# Patient Record
Sex: Female | Born: 1985 | Race: White | Hispanic: No | Marital: Single | Smoking: Current some day smoker
Health system: Southern US, Community
[De-identification: ages and names within clinical notes are randomized; demographics above are authoritative.]

## PROBLEM LIST (undated history)

## (undated) DIAGNOSIS — M549 Dorsalgia, unspecified: Secondary | ICD-10-CM

## (undated) DIAGNOSIS — F329 Major depressive disorder, single episode, unspecified: Secondary | ICD-10-CM

## (undated) DIAGNOSIS — D649 Anemia, unspecified: Secondary | ICD-10-CM

## (undated) DIAGNOSIS — F1010 Alcohol abuse, uncomplicated: Secondary | ICD-10-CM

## (undated) DIAGNOSIS — F419 Anxiety disorder, unspecified: Secondary | ICD-10-CM

## (undated) DIAGNOSIS — F3130 Bipolar disorder, current episode depressed, mild or moderate severity, unspecified: Secondary | ICD-10-CM

## (undated) DIAGNOSIS — E538 Deficiency of other specified B group vitamins: Secondary | ICD-10-CM

## (undated) DIAGNOSIS — J45909 Unspecified asthma, uncomplicated: Secondary | ICD-10-CM

## (undated) DIAGNOSIS — F1990 Other psychoactive substance use, unspecified, uncomplicated: Secondary | ICD-10-CM

## (undated) DIAGNOSIS — K5900 Constipation, unspecified: Secondary | ICD-10-CM

## (undated) DIAGNOSIS — F32A Depression, unspecified: Secondary | ICD-10-CM

## (undated) HISTORY — DX: Constipation, unspecified: K59.00

## (undated) HISTORY — DX: Anemia, unspecified: D64.9

## (undated) HISTORY — DX: Deficiency of other specified B group vitamins: E53.8

## (undated) HISTORY — DX: Alcohol abuse, uncomplicated: F10.10

## (undated) HISTORY — PX: FRACTURE SURGERY: SHX138

## (undated) HISTORY — DX: Unspecified asthma, uncomplicated: J45.909

## (undated) HISTORY — DX: Dorsalgia, unspecified: M54.9

## (undated) HISTORY — DX: Bipolar disorder, current episode depressed, mild or moderate severity, unspecified: F31.30

## (undated) HISTORY — PX: OTHER SURGICAL HISTORY: SHX169

## (undated) HISTORY — PX: BREAST SURGERY: SHX581

## (undated) HISTORY — DX: Other psychoactive substance use, unspecified, uncomplicated: F19.90

---
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
## 2006-04-17 ENCOUNTER — Emergency Department (HOSPITAL_COMMUNITY): Admission: EM | Admit: 2006-04-17 | Discharge: 2006-04-17 | Payer: Self-pay | Admitting: Emergency Medicine

## 2007-02-24 ENCOUNTER — Other Ambulatory Visit: Admission: RE | Admit: 2007-02-24 | Discharge: 2007-02-24 | Payer: Self-pay | Admitting: Gynecology

## 2007-05-21 ENCOUNTER — Other Ambulatory Visit (HOSPITAL_COMMUNITY): Admission: RE | Admit: 2007-05-21 | Discharge: 2007-08-19 | Payer: Self-pay | Admitting: Psychiatry

## 2007-05-21 ENCOUNTER — Ambulatory Visit: Payer: Self-pay | Admitting: Psychiatry

## 2008-01-06 ENCOUNTER — Encounter: Admission: RE | Admit: 2008-01-06 | Discharge: 2008-01-06 | Payer: Self-pay | Admitting: Gynecology

## 2008-07-04 ENCOUNTER — Encounter: Admission: RE | Admit: 2008-07-04 | Discharge: 2008-07-04 | Payer: Self-pay | Admitting: Gynecology

## 2009-01-30 ENCOUNTER — Encounter: Admission: RE | Admit: 2009-01-30 | Discharge: 2009-01-30 | Payer: Self-pay | Admitting: Gynecology

## 2009-02-27 ENCOUNTER — Encounter (INDEPENDENT_AMBULATORY_CARE_PROVIDER_SITE_OTHER): Payer: Self-pay | Admitting: Surgery

## 2009-02-27 ENCOUNTER — Ambulatory Visit (HOSPITAL_BASED_OUTPATIENT_CLINIC_OR_DEPARTMENT_OTHER): Admission: RE | Admit: 2009-02-27 | Discharge: 2009-02-27 | Payer: Self-pay | Admitting: Surgery

## 2009-10-21 HISTORY — PX: BREAST SURGERY: SHX581

## 2010-11-12 ENCOUNTER — Other Ambulatory Visit: Payer: Self-pay | Admitting: Gynecology

## 2010-12-03 ENCOUNTER — Other Ambulatory Visit: Payer: Self-pay | Admitting: Gynecology

## 2011-01-29 LAB — POCT HEMOGLOBIN-HEMACUE: Hemoglobin: 13 g/dL (ref 12.0–15.0)

## 2011-02-07 ENCOUNTER — Other Ambulatory Visit: Payer: Self-pay | Admitting: Dermatology

## 2011-03-05 NOTE — Op Note (Signed)
Sarah Cardenas, Sarah Cardenas                  ACCOUNT NO.:  [id number]   MEDICAL RECORD NO.:  [id number]          PATIENT TYPE:  AMB   LOCATION:  DSC                          FACILITY:  [facility]   PHYSICIAN:  Currie Paris, M.D.DATE OF BIRTH:  July 28, 1986   DATE OF PROCEDURE:  DATE OF DISCHARGE:  02/27/2009                               OPERATIVE REPORT   PREOPERATIVE DIAGNOSIS:  Left breast mass, probable fibroadenoma.   POSTOPERATIVE DIAGNOSIS:  Left breast mass, probable fibroadenoma.   OPERATION:  Excisional biopsy, left breast mass.   SURGEON:  Currie Paris, MD   ANESTHESIA:  General.   CLINICAL HISTORY:  This is a 25 year old young lady with a palpable  somewhat enlarging left breast mass that she wished to have removed.   DESCRIPTION OF PROCEDURE:  I saw the patient in the holding area and we  confirmed left breast mass excision as the planned procedure.  The left  breast was initialed to confirm the operative side.   The patient was taken to the operating room, and after satisfactory  general anesthesia had been obtained, the left breast was prepped and  draped and the time-out done.   I made a short curvilinear incision just inferior to the mass, staying a  little closer to the nipple.  I divided some subcutaneous tissue and by  palpation was then able to come around the mass using cautery and  excised it in toto.   Once it was out, I made sure everything was dry throughout.  I used  0.25% plain Marcaine to help with postop analgesia.   The incision was closed in layers with 3-0 Vicryl and 4-0 Monocryl  subcuticular plus Dermabond.   The patient tolerated the procedure well, and there were no operative  complications.  All counts were correct.      Currie Paris, M.D.  Electronically Signed     CJS/MEDQ  D:  02/27/2009  T:  02/28/2009  Job:  [id number]   cc:   Gretta Cool, M.D.  Clovis Riley, FNP  [facility].

## 2011-06-10 ENCOUNTER — Other Ambulatory Visit: Payer: Self-pay | Admitting: Gynecology

## 2011-07-12 ENCOUNTER — Other Ambulatory Visit (INDEPENDENT_AMBULATORY_CARE_PROVIDER_SITE_OTHER): Payer: Self-pay | Admitting: Otolaryngology

## 2011-07-12 DIAGNOSIS — H7010 Chronic mastoiditis, unspecified ear: Secondary | ICD-10-CM

## 2011-07-15 ENCOUNTER — Ambulatory Visit
Admission: RE | Admit: 2011-07-15 | Discharge: 2011-07-15 | Disposition: A | Discharge status: Home / Self Care | Payer: 59 | Source: Ambulatory Visit | Attending: Otolaryngology | Admitting: Otolaryngology

## 2011-07-15 DIAGNOSIS — H7010 Chronic mastoiditis, unspecified ear: Secondary | ICD-10-CM

## 2011-08-02 LAB — URINE DRUGS OF ABUSE SCREEN W ALC, ROUTINE (REF LAB)
Amphetamine Screen, Ur: NEGATIVE
Benzodiazepines.: NEGATIVE
Benzodiazepines.: NEGATIVE
Cocaine Metabolites: NEGATIVE
Creatinine,U: 242.8
Ethyl Alcohol: <5
Ethyl Alcohol: <5
Marijuana Metabolite: NEGATIVE
Opiate Screen, Urine: NEGATIVE
Opiate Screen, Urine: NEGATIVE
Phencyclidine (PCP): NEGATIVE
Propoxyphene: NEGATIVE

## 2011-08-05 LAB — URINE DRUGS OF ABUSE SCREEN W ALC, ROUTINE (REF LAB)
Amphetamine Screen, Ur: NEGATIVE
Barbiturate Quant, Ur: NEGATIVE
Benzodiazepines.: NEGATIVE
Cocaine Metabolites: NEGATIVE
Creatinine,U: 102.3
Ethyl Alcohol: <5
Marijuana Metabolite: NEGATIVE
Methadone: NEGATIVE
Opiate Screen, Urine: NEGATIVE
Phencyclidine (PCP): NEGATIVE
Propoxyphene: NEGATIVE

## 2011-12-23 ENCOUNTER — Other Ambulatory Visit: Payer: Self-pay | Admitting: Gynecology

## 2012-03-13 ENCOUNTER — Emergency Department (HOSPITAL_COMMUNITY)
Admission: EM | Admit: 2012-03-13 | Discharge: 2012-03-13 | Disposition: A | Discharge status: Home / Self Care | Payer: BC Managed Care – PPO | Attending: Emergency Medicine | Admitting: Emergency Medicine

## 2012-03-13 ENCOUNTER — Encounter (HOSPITAL_COMMUNITY): Payer: Self-pay | Admitting: *Deleted

## 2012-03-13 DIAGNOSIS — F17200 Nicotine dependence, unspecified, uncomplicated: Secondary | ICD-10-CM | POA: Insufficient documentation

## 2012-03-13 DIAGNOSIS — N390 Urinary tract infection, site not specified: Secondary | ICD-10-CM

## 2012-03-13 DIAGNOSIS — F411 Generalized anxiety disorder: Secondary | ICD-10-CM | POA: Insufficient documentation

## 2012-03-13 DIAGNOSIS — I10 Essential (primary) hypertension: Secondary | ICD-10-CM | POA: Insufficient documentation

## 2012-03-13 DIAGNOSIS — F3289 Other specified depressive episodes: Secondary | ICD-10-CM | POA: Insufficient documentation

## 2012-03-13 DIAGNOSIS — T7840XA Allergy, unspecified, initial encounter: Secondary | ICD-10-CM | POA: Insufficient documentation

## 2012-03-13 DIAGNOSIS — F329 Major depressive disorder, single episode, unspecified: Secondary | ICD-10-CM | POA: Insufficient documentation

## 2012-03-13 HISTORY — DX: Anxiety disorder, unspecified: F41.9

## 2012-03-13 HISTORY — DX: Depression, unspecified: F32.A

## 2012-03-13 HISTORY — DX: Major depressive disorder, single episode, unspecified: F32.9

## 2012-03-13 LAB — URINALYSIS, ROUTINE W REFLEX MICROSCOPIC
Bilirubin Urine: NEGATIVE
Ketones, ur: NEGATIVE mg/dL
Nitrite: NEGATIVE
Specific Gravity, Urine: 1.016 (ref 1.005–1.030)
Urobilinogen, UA: 0.2 mg/dL (ref 0.0–1.0)

## 2012-03-13 LAB — URINE MICROSCOPIC-ADD ON

## 2012-03-13 MED ORDER — CEPHALEXIN 500 MG PO CAPS: 500.0000 mg | ORAL_CAPSULE | Freq: Four times a day (QID) | ORAL | Status: AC

## 2012-03-13 MED ORDER — DIPHENHYDRAMINE HCL 25 MG PO CAPS
25.0000 mg | ORAL_CAPSULE | Freq: Once | ORAL | Status: AC
  Administered 2012-03-13: 25 mg via ORAL
  Filled 2012-03-13: qty 1

## 2012-03-13 MED ORDER — DIPHENHYDRAMINE HCL 25 MG PO TABS: 25.0000 mg | ORAL_TABLET | Freq: Four times a day (QID) | ORAL | Status: DC

## 2012-03-13 NOTE — Discharge Instructions (Signed)
Allergic Reaction, Mild to Moderate Allergies may happen from anything your body is sensitive to. This may be food, medications, pollens, chemicals, and nearly anything around you in everyday life that produces allergens. An allergen is anything that causes an allergy producing substance. Allergens cause your body to release allergic antibodies. Through a chain of events, they cause a release of histamine into the blood stream. Histamines are meant to protect you, but they also cause your discomfort. This is why antihistamines are often used for allergies. Heredity is often a factor in causing allergic reactions. This means you may have some of the same allergies as your parents. Allergies happen in all age groups. You may have some idea of what caused your reaction. There are many allergens around us. It may be difficult to know what caused your reaction. If this is a first time event, it may never happen again. Allergies cannot be cured but can be controlled with medications. SYMPTOMS  You may get some or all of the following problems from allergies.  Swelling and itching in and around the mouth.   Tearing, itchy eyes.   Nasal congestion and runny nose.   Sneezing and coughing.   An itchy red rash or hives.   Vomiting or diarrhea.   Difficulty breathing.  Seasonal allergies occur in all age groups. They are seasonal because they usually occur during the same season every year. They may be a reaction to molds, grass pollens, or tree pollens. Other causes of allergies are house dust mite allergens, pet dander and mold spores. These are just a common few of the thousands of allergens around us. All of the symptoms listed above happen when you come in contact with pollens and other allergens. Seasonal allergies are usually not life threatening. They are generally more of a nuisance that can often be handled using medications. Hay fever is a combination of all or some of the above listed allergy  problems. It may often be treated with simple over-the-counter medications such as diphenhydramine. Take medication as directed. Check with your caregiver or package insert for child dosages. TREATMENT AND HOME CARE INSTRUCTIONS If hives or rash are present:  Take medications as directed.   You may use an over-the-counter antihistamine (diphenhydramine) for hives and itching as needed. Do not drive or drink alcohol until medications used to treat the reaction have worn off. Antihistamines tend to make people sleepy.   Apply cold cloths (compresses) to the skin or take baths in cool water. This will help itching. Avoid hot baths or showers. Heat will make a rash and itching worse.   If your allergies persist and become more severe, and over the counter medications are not effective, there are many new medications your caretaker can prescribe. Immunotherapy or desensitizing injections can be used if all else fails. Follow up with your caregiver if problems continue.  SEEK MEDICAL CARE IF:   Your allergies are becoming progressively more troublesome.   You suspect a food allergy. Symptoms generally happen within 30 minutes of eating a food.   Your symptoms have not gone away within 2 days or are getting worse.   You develop new symptoms.   You want to retest yourself or your child with a food or drink you think causes an allergic reaction. Never test yourself or your child of a suspected allergy without being under the watchful eye of your caregivers. A second exposure to an allergen may be life-threatening.  SEEK IMMEDIATE MEDICAL CARE IF:  You   develop difficulty breathing or wheezing, or have a tight feeling in your chest or throat.   You develop a swollen mouth, hives, swelling, or itching all over your body.  A severe reaction with any of the above problems should be considered life-threatening. If you suddenly develop difficulty breathing call for local emergency medical help. THIS IS AN  EMERGENCY. MAKE SURE YOU:   Understand these instructions.   Will watch your condition.   Will get help right away if you are not doing well or get worse.  Document Released: 08/04/2007 Document Revised: 09/26/2011 Document Reviewed: 08/04/2007 [facility] Patient Information 2012 [place], [place].  Stop the Cipro medicine may have had a mild allergic reaction to that. Start the Keflex take as directed also take Benadryl 25 mg every 6 hours for the next 2 days. And then as needed. Return for any new or worse symptoms. Followup with your regular Dr. in the next 2-4 days if not better. Return to the emergency department for tongue swelling lip swelling trouble breathing or development of hives.

## 2012-03-13 NOTE — ED Notes (Signed)
Pt presents with onset of dizziness that began yesterday while riding in car with her mother.  Pt reports she awoke with tingling to her lips today.  Pt reports feeling "unstable" on her feet with tingling reported to her legs.  Pt reports she was started on generic prozac on Monday and Cipro on Tuesday for bladder infection.  Pt denies any difficulty swallowing or swelling on tongue.

## 2012-03-13 NOTE — ED Notes (Signed)
Pt reports she was started on Prozac last week and went to doctors for followup and had blood in her urine, was dx with bladder infection and has been taking them, states last night she started having facial numbness (states entire head). Perrla. gcs 15. Moves all extremities without difficulty.

## 2012-03-13 NOTE — ED Provider Notes (Signed)
History   This chart was scribed for Shelda Jakes, MD by Shari Heritage. The patient was seen in room STRE3/STRE3. Patient's care was started at 1108.     CSN: [id number]  Arrival date & time 03/13/12  1108   First MD Initiated Contact with Patient 03/13/12 1157      Chief Complaint  Patient presents with  . Numbness  . Allergic Reaction    (Consider location/radiation/quality/duration/timing/severity/associated sxs/prior treatment) The history is provided by the patient. No language interpreter was used.   Larry Knipp is a 26 y.o. female who presents to the Emergency Department complaining of tingling in face associated with itching onset. Patient denies hives or swelling. Patient denies being itchy anywhere else on body. Patient says that she visited doctor on Tuesday with diagnosis of bladder infection and has been taking Cipro. Patient is currently on Prozac. Patient with h/o of anxiety and depression. Patient is a current smoker.  Past Medical History  Diagnosis Date  . Anxiety   . Depression     No past surgical history on file.  No family history on file.  History  Substance Use Topics  . Smoking status: Current Some Day Smoker  . Smokeless tobacco: Not on file  . Alcohol Use: Yes     occ    OB History    Grav Para Term Preterm Abortions TAB SAB Ect Mult Living                  Review of Systems  Constitutional: Negative for fever and chills.  HENT: Negative for neck pain.   Respiratory: Negative for shortness of breath.   Gastrointestinal: Negative for nausea and vomiting.  Neurological: Negative for weakness and headaches.       Tingling in face. Itchiness in face.    Allergies  Review of patient's allergies indicates no known allergies.  Home Medications   Current Outpatient Rx  Name Route Sig Dispense Refill  . CIPROFLOXACIN HCL 250 MG PO TABS Oral Take 250 mg by mouth 2 (two) times daily. For 7 days starting on 03/10/12    . FLUOXETINE HCL  10 MG PO CAPS Oral Take 10 mg by mouth daily.    Marland Kitchen LORAZEPAM 0.5 MG PO TABS Oral Take 0.5 mg by mouth daily as needed. For anxiety    . CEPHALEXIN 500 MG PO CAPS Oral Take 1 capsule (500 mg total) by mouth 4 (four) times daily. 28 capsule 0  . DIPHENHYDRAMINE HCL 25 MG PO TABS Oral Take 1 tablet (25 mg total) by mouth every 6 (six) hours. 20 tablet 0    BP 106/72  Pulse 77  Temp(Src) 98.1 F (36.7 C) (Oral)  Resp 18  SpO2 98%  Physical Exam  Constitutional: She is oriented to person, place, and time.  HENT:  Head: Atraumatic.  Mouth/Throat: Oropharynx is clear and moist.       No redness to back of throat. No swelling.  Cardiovascular: Normal rate, regular rhythm and normal heart sounds.   No murmur heard. Pulmonary/Chest: Effort normal and breath sounds normal. She has no wheezes.  Abdominal: Bowel sounds are normal. There is no tenderness.  Lymphadenopathy:    She has no cervical adenopathy.  Neurological: She is alert and oriented to person, place, and time.  Skin: No rash noted. No erythema.    ED Course  Procedures (including critical care time) DIAGNOSTIC STUDIES: Oxygen Saturation is 98% on room air, normal by my interpretation.    COORDINATION OF  CARE: 12:35PM- Patient informed of current plan for treatment and evaluation and agrees with plan at this time. Patient's urine is still infected.  Labs Reviewed  URINALYSIS, ROUTINE W REFLEX MICROSCOPIC - Abnormal; Notable for the following:    APPearance CLOUDY (*)    Leukocytes, UA LARGE (*)    All other components within normal limits  URINE MICROSCOPIC-ADD ON - Abnormal; Notable for the following:    Squamous Epithelial / LPF MANY (*)    All other components within normal limits  POCT PREGNANCY, URINE   No results found.   1. Allergic reaction   2. Urinary tract infection       MDM  Patient recently started on Cipro for a urinary tract infection and developed some itching in her face and her head no rash  no lip swelling no tongue swelling no shortness of breath or wheezing. It is possible that she has an allergy to Cipro we'll have her stop that and start Keflex since the urine still looks like it may be infected even no there is a large amount of epithelial cells. Patient's only been on Cipro for 2 days. For the allergic reaction that is appropriate just to treat that just the Benadryl because is very mild. Patient will take Benadryl for the next 2 days every 6 hours and then as needed. Cautions provided patient return for new or worse symptoms.      I personally performed the services described in this documentation, which was scribed in my presence. The recorded information has been reviewed and considered.    Shelda Jakes, MD 03/13/12 1300

## 2012-08-06 ENCOUNTER — Other Ambulatory Visit: Payer: Self-pay | Admitting: Gynecology

## 2013-11-16 ENCOUNTER — Encounter (HOSPITAL_COMMUNITY): Payer: Self-pay | Admitting: Emergency Medicine

## 2013-11-16 ENCOUNTER — Emergency Department (HOSPITAL_COMMUNITY)
Admission: EM | Admit: 2013-11-16 | Discharge: 2013-11-16 | Disposition: A | Discharge status: Home / Self Care | Payer: BC Managed Care – PPO | Attending: Emergency Medicine | Admitting: Emergency Medicine

## 2013-11-16 DIAGNOSIS — F17200 Nicotine dependence, unspecified, uncomplicated: Secondary | ICD-10-CM | POA: Insufficient documentation

## 2013-11-16 DIAGNOSIS — F329 Major depressive disorder, single episode, unspecified: Secondary | ICD-10-CM | POA: Insufficient documentation

## 2013-11-16 DIAGNOSIS — F3289 Other specified depressive episodes: Secondary | ICD-10-CM | POA: Insufficient documentation

## 2013-11-16 DIAGNOSIS — Z79899 Other long term (current) drug therapy: Secondary | ICD-10-CM | POA: Insufficient documentation

## 2013-11-16 DIAGNOSIS — IMO0001 Reserved for inherently not codable concepts without codable children: Secondary | ICD-10-CM | POA: Insufficient documentation

## 2013-11-16 DIAGNOSIS — F411 Generalized anxiety disorder: Secondary | ICD-10-CM | POA: Insufficient documentation

## 2013-11-16 DIAGNOSIS — L0501 Pilonidal cyst with abscess: Secondary | ICD-10-CM | POA: Insufficient documentation

## 2013-11-16 MED ORDER — HYDROCODONE-ACETAMINOPHEN 5-325 MG PO TABS: 1.0000 | ORAL_TABLET | ORAL | Status: DC | PRN

## 2013-11-16 NOTE — Discharge Instructions (Signed)
Pilonidal Cyst A pilonidal cyst occurs when hairs get trapped (ingrown) beneath the skin in the crease between the buttocks over your sacrum (the bone under that crease). Pilonidal cysts are most common in young men with a lot of body hair. When the cyst is ruptured (breaks) or leaking, fluid from the cyst may cause burning and itching. If the cyst becomes infected, it causes a painful swelling filled with pus (abscess). The pus and trapped hairs need to be removed (often by lancing) so that the infection can heal. However, recurrence is common and an operation may be needed to remove the cyst. HOME CARE INSTRUCTIONS   If the cyst was NOT INFECTED:  Keep the area clean and dry. Bathe or shower daily. Wash the area well with a germ-killing soap. Warm tub baths may help prevent infection and help with drainage. Dry the area well with a towel.  Avoid tight clothing to keep area as moisture free as possible.  Keep area between buttocks as free of hair as possible. A depilatory may be used.  If the cyst WAS INFECTED and needed to be drained:  Your caregiver packed the wound with gauze to keep the wound open. This allows the wound to heal from the inside outwards and continue draining.  Return for a wound check in 1 day or as suggested.  If you take tub baths or showers, repack the wound with gauze following them. Sponge baths (at the sink) are a good alternative.  If an antibiotic was ordered to fight the infection, take as directed.  Only take over-the-counter or prescription medicines for pain, discomfort, or fever as directed by your caregiver.  After the drain is removed, use sitz baths for 20 minutes 4 times per day. Clean the wound gently with mild unscented soap, pat dry, and then apply a dry dressing. SEEK MEDICAL CARE IF:   You have increased pain, swelling, redness, drainage, or bleeding from the area.  You have a fever.  You have muscles aches, dizziness, or a general ill  feeling. Document Released: 10/04/2000 Document Revised: 12/30/2011 Document Reviewed: 12/02/2008 [employer] Patient Information 2014 [employer].  

## 2013-11-16 NOTE — ED Provider Notes (Signed)
CSN: [id number]     Arrival date & time 11/16/13  1247 History  This chart was scribed for non-physician practitioner, Izola Price. Marisue Humble, PA-C working with Rolland Porter, MD by Greggory Stallion, ED scribe. This patient was seen in room WTR7/WTR7 and the patient's care was started at 1:51 PM.   Chief Complaint  Patient presents with  . Tailbone Pain   The history is provided by the patient. No language interpreter was used.   HPI Comments: Sarah Cardenas is a 28 y.o. female who presents to the Emergency Department complaining of gradual onset, worsening tailbone pain that started 3 days ago. Denies injury, trauma or fall. Sitting on her tailbone worsens the pain. Pt states there is some redness on her tailbone. Denies drainage from the area. Denies hematochezia, difficulty with bowel movements.   Past Medical History  Diagnosis Date  . Anxiety   . Depression    Past Surgical History  Procedure Laterality Date  . Breast surgery      lump removed  . Ear drum replacement      right  . Fracture surgery     No family history on file. History  Substance Use Topics  . Smoking status: Current Some Day Smoker    Types: Cigarettes  . Smokeless tobacco: Not on file  . Alcohol Use: Yes     Comment: occ   OB History   Grav Para Term Preterm Abortions TAB SAB Ect Mult Living                 Review of Systems  Gastrointestinal: Negative for constipation and blood in stool.  Musculoskeletal: Positive for myalgias (buttocks).  All other systems reviewed and are negative.    Allergies  Ciprofloxacin  Home Medications   Current Outpatient Rx  Name  Route  Sig  Dispense  Refill  . ibuprofen (ADVIL,MOTRIN) 200 MG tablet   Oral   Take 400 mg by mouth every 6 (six) hours as needed.         Marland Kitchen LORazepam (ATIVAN) 0.5 MG tablet   Oral   Take 0.5 mg by mouth daily as needed. For anxiety         . sertraline (ZOLOFT) 100 MG tablet   Oral   Take 200 mg by mouth daily.          BP  123/78  Pulse 86  Temp(Src) 97.6 F (36.4 C) (Oral)  Resp 18  SpO2 97%  LMP 11/03/2013  Physical Exam  Nursing note and vitals reviewed. Constitutional: She is oriented to person, place, and time. She appears well-developed and well-nourished. No distress.  HENT:  Head: Normocephalic and atraumatic.  Mouth/Throat: Oropharynx is clear and moist.  Pulmonary/Chest: Effort normal.  Musculoskeletal: She exhibits no edema and no tenderness.  Neurological: She is alert and oriented to person, place, and time. Coordination normal.  Skin: Skin is warm and dry. No rash noted. There is erythema. No pallor.  2 cm area of induration and 1cm area of fluctuance to left natal cleft.  Psychiatric: She has a normal mood and affect. Her behavior is normal. Judgment and thought content normal.    ED Course  Procedures (including critical care time)  DIAGNOSTIC STUDIES: Oxygen Saturation is 97% on RA, normal by my interpretation.    COORDINATION OF CARE: 1:54 PM-Discussed treatment plan which includes [place] and possible I&D with pt at bedside and pt agreed to plan.   Labs Review Labs Reviewed - No data to display Imaging  Review No results found.  EKG Interpretation   None     INCISION AND DRAINAGE Performed by: Cherrie DistanceSANFORD,Greysin Medlen C. Consent: Verbal consent obtained. Risks and benefits: risks, benefits and alternatives were discussed Type: abscess  Body area: left natal cleft  Anesthesia: local infiltration  Incision was made with a scalpel.  Local anesthetic: lidocaine 2% with epinephrine  Anesthetic total: 3 ml  Complexity: complex Blunt dissection to break up loculations  Drainage: purulent  Drainage amount: large  Packing material: 1/4 in iodoform gauze  Patient tolerance: Patient tolerated the procedure well with no immediate complications.  EMERGENCY DEPARTMENT US SOFT TISSUE INTERPRETATION "Study: Limited Ultrasound of the noted body part in comments  below"  INDICATIONS: Soft tissue infection Multiple views of the body part are obtained with a multi-frequency linear probe  PERFORMED BY:  Midlevel  IMAGES ARCHIVED?: Yes  SIDE:Midline  BODY PART:Other soft tisse (comment in note)  FINDINGS: Abcess present  LIMITATIONS:  None  INTERPRETATION:  Abcess present  COMMENT:  Left natal cleft - US read by Dr. Patria Maneampos    MDM  Pilonidal abscess  Patient here with "tailbone" pain for 3 days - noted with pilonidal abscess - large amount of drainage - she will follow up here in 2 days for wound recheck.  I personally performed the services described in this documentation, which was scribed in my presence. The recorded information has been reviewed and is accurate.   Izola PriceFrances C. Marisue HumbleSanford, PA-C 11/16/13 1520  Scarlette CalicoFrances C. Marisue HumbleSanford, [place] JerseyPA-C 11/16/13 1530

## 2013-11-16 NOTE — ED Notes (Signed)
Pt c/o tailbone pain that started Saturday morning when she woke up but had gradually gotten worse since. Pt denies any injury or accident that could cause the pain. Pt unable to sit in her bottom.

## 2013-11-18 ENCOUNTER — Encounter (HOSPITAL_COMMUNITY): Payer: Self-pay | Admitting: Emergency Medicine

## 2013-11-18 ENCOUNTER — Emergency Department (HOSPITAL_COMMUNITY)
Admission: EM | Admit: 2013-11-18 | Discharge: 2013-11-18 | Disposition: A | Discharge status: Home / Self Care | Payer: BC Managed Care – PPO | Attending: Emergency Medicine | Admitting: Emergency Medicine

## 2013-11-18 DIAGNOSIS — F329 Major depressive disorder, single episode, unspecified: Secondary | ICD-10-CM | POA: Insufficient documentation

## 2013-11-18 DIAGNOSIS — Z5189 Encounter for other specified aftercare: Secondary | ICD-10-CM

## 2013-11-18 DIAGNOSIS — Z4801 Encounter for change or removal of surgical wound dressing: Secondary | ICD-10-CM | POA: Insufficient documentation

## 2013-11-18 DIAGNOSIS — F3289 Other specified depressive episodes: Secondary | ICD-10-CM | POA: Insufficient documentation

## 2013-11-18 DIAGNOSIS — F17200 Nicotine dependence, unspecified, uncomplicated: Secondary | ICD-10-CM | POA: Insufficient documentation

## 2013-11-18 DIAGNOSIS — F411 Generalized anxiety disorder: Secondary | ICD-10-CM | POA: Insufficient documentation

## 2013-11-18 DIAGNOSIS — L0501 Pilonidal cyst with abscess: Secondary | ICD-10-CM

## 2013-11-18 DIAGNOSIS — Z79899 Other long term (current) drug therapy: Secondary | ICD-10-CM | POA: Insufficient documentation

## 2013-11-18 NOTE — ED Provider Notes (Signed)
CSN: [id number]     Arrival date & time 11/18/13  4098 History   First MD Initiated Contact with Patient 11/18/13 1006     Chief Complaint  Patient presents with  . abscess recheck    (Consider location/radiation/quality/duration/timing/severity/associated sxs/prior Treatment) HPI Sarah Cardenas is a 28 y.o. female who presents to emergency department for wound recheck. Patient states that she had pilonidal cyst abscess drained 2 days ago. States it is doing well. Continues to have some tenderness. States she's here for wound recheck she was told to do. Patient denies any more drainage from the abscess. She denies any fever, chills. She has been doing dressing changes at home. She is taking Norco for her pain. She has no other complaints.  Past Medical History  Diagnosis Date  . Anxiety   . Depression    Past Surgical History  Procedure Laterality Date  . Breast surgery      lump removed  . Ear drum replacement      right  . Fracture surgery     History reviewed. No pertinent family history. History  Substance Use Topics  . Smoking status: Current Some Day Smoker    Types: Cigarettes  . Smokeless tobacco: Not on file  . Alcohol Use: Yes     Comment: occ   OB History   Grav Para Term Preterm Abortions TAB SAB Ect Mult Living                 Review of Systems  Constitutional: Negative for fever and chills.  Respiratory: Negative for cough, chest tightness and shortness of breath.   Cardiovascular: Negative for chest pain, palpitations and leg swelling.  Musculoskeletal: Negative for arthralgias and myalgias.  Skin: Positive for wound. Negative for rash.  Neurological: Negative for dizziness, weakness and headaches.  All other systems reviewed and are negative.    Allergies  Ciprofloxacin  Home Medications   Current Outpatient Rx  Name  Route  Sig  Dispense  Refill  . HYDROcodone-acetaminophen (NORCO/VICODIN) 5-325 MG per tablet   Oral   Take 1 tablet by mouth every  4 (four) hours as needed for moderate pain.   15 tablet   0   . ibuprofen (ADVIL,MOTRIN) 200 MG tablet   Oral   Take 400 mg by mouth every 6 (six) hours as needed.         Marland Kitchen LORazepam (ATIVAN) 0.5 MG tablet   Oral   Take 0.5 mg by mouth daily as needed. For anxiety         . sertraline (ZOLOFT) 100 MG tablet   Oral   Take 200 mg by mouth daily.          BP 118/58  Pulse 81  Temp(Src) 98.9 F (37.2 C) (Oral)  Resp 20  SpO2 98%  LMP 11/03/2013 Physical Exam  Nursing note and vitals reviewed. Constitutional: She appears well-developed and well-nourished. No distress.  Eyes: Conjunctivae are normal.  Neck: Neck supple.  Neurological: She is alert.  Skin: Skin is warm and dry.  Pilonidal I&Ded abscess to the lower back just at gluteal folds. Packing intact. No surrounding erythema. No drainage.       ED Course  Procedures (including critical care time) Labs Review Labs Reviewed - No data to display Imaging Review No results found.  EKG Interpretation   None       MDM   1. Pilonidal cyst with abscess   2. Encounter for wound re-check  Patient is here after I&D of the pilonidal cyst 2 days ago. It is healing well. There is no signs of surrounding infection or cellulitis. Packing was removed. There is no more drainage. Area is nontender. Discharge home with followup as needed. Discussed wound care at home.  Filed Vitals:   11/18/13 1001  BP: 118/58  Pulse: 81  Temp: 98.9 F (37.2 C)  Resp: 20      Gerilyn Stargell A Josefita Weissmann, PA-C 11/18/13 1035

## 2013-11-18 NOTE — ED Notes (Signed)
Pt escorted to discharge window. Pt verbalized understanding discharge instructions. In no acute distress.  

## 2013-11-18 NOTE — ED Notes (Signed)
Pt was seen on 1/27 for abscess on tailbone area, returned today to get packing removed. Pain 5/10.

## 2013-11-18 NOTE — Discharge Instructions (Signed)
Continue to keep wound clean and covered. Apply warm compresses several times a day or do warm sitz baths. Follow up as needed.   Pilonidal Cyst, Care After A pilonidal cyst occurs when hairs get trapped (ingrown) beneath the skin in the crease between the buttocks over your sacrum (the bone under that crease). Pilonidal cysts are most common in young men with a lot of body hair. When the cyst breaks(ruptured) or leaks, fluid from the cyst may cause burning and itching. If the cyst becomes infected, it causes a painful swelling filled with pus (abscess). The pus and trapped hairs need to be removed (often by lancing) so that the infection can heal. The word pilonidal means hair nest. HOME CARE INSTRUCTIONS If the pilonidal sinus was NOT DRAINING OR LANCED:  Keep the area clean and dry. Bathe or shower daily. Wash the area well with a germ-killing soap. Hot tub baths may help prevent infection. Dry the area well with a towel.  Avoid tight clothing in order to keep area as moisture-free as possible.  Keep area between buttocks as free from hair as possible. A depilatory may be used.  Take antibiotics as directed.  Only take over-the-counter or prescription medicines for pain, discomfort, or fever as directed by your caregiver. If the cyst WAS INFECTED AND NEEDED TO BE DRAINED:  Your caregiver may have packed the wound with gauze to keep the wound open. This allows the wound to heal from the inside outward and continue to drain.  Return as directed for a wound check.  If you take tub baths or showers, repack the wound with gauze as directed following. Sponge baths are a good alternative. Sitz baths may be used three to four times a day or as directed.  If an antibiotic was ordered to fight the infection, take as directed.  Only take over-the-counter or prescription medicines for pain, discomfort, or fever as directed by your caregiver.  If a drain was in place and removed, use sitz baths for  20 minutes 4 times per day. Clean the wound gently with mild unscented soap, pat dry, and then apply a dry dressing as directed. If you had surgery and IT WAS MARSUPIALIZED (LEFT OPEN):  Your wound was packed with gauze to keep the wound open. This allows the wound to heal from the inside outwards and continue draining. The changing of the dressing regularly also helps keep the wound clean.  Return as directed for a wound check.  If you take tub baths or showers, repack the wound with gauze as directed following. Sponge baths are a good alternative. Sitz baths can also be used. This may be done three to four times a day or as directed.  If an antibiotic was ordered to fight the infection, take as directed.  Only take over-the-counter or prescription medicines for pain, discomfort, or fever as directed by your caregiver.  If you had surgery and the wound was closed you may care for it as directed. This generally includes keeping it dry and clean and dressing it as directed. SEEK MEDICAL CARE IF:   You have increased pain, swelling, redness, drainage, or bleeding from the area.  You have a fever.  You have muscles aches, dizziness, or a general ill feeling. Document Released: 11/07/2006 Document Revised: 06/09/2013 Document Reviewed: 01/22/2007 [facility] Patient Information 2014 [employer], [place].

## 2013-11-19 NOTE — ED Provider Notes (Signed)
Medical screening examination/treatment/procedure(s) were performed by non-physician practitioner and as supervising physician I was immediately available for consultation/collaboration.  EKG Interpretation   None         Italia Wolfert, MD 11/19/13 1542 

## 2013-11-21 NOTE — ED Provider Notes (Signed)
Medical screening examination/treatment/procedure(s) were performed by non-physician practitioner and as supervising physician I was immediately available for consultation/collaboration.  EKG Interpretation   None         Suzi RootsKevin E Peyton Spengler, MD 11/21/13 [phone]

## 2014-06-07 ENCOUNTER — Ambulatory Visit (INDEPENDENT_AMBULATORY_CARE_PROVIDER_SITE_OTHER): Payer: BC Managed Care – PPO | Admitting: General Surgery

## 2014-06-07 ENCOUNTER — Encounter (INDEPENDENT_AMBULATORY_CARE_PROVIDER_SITE_OTHER): Payer: Self-pay | Admitting: General Surgery

## 2014-06-07 VITALS — BP 116/84 | HR 94 | Temp 98.7°F | Ht 64.0 in | Wt 205.4 lb

## 2014-06-07 DIAGNOSIS — L03317 Cellulitis of buttock: Secondary | ICD-10-CM

## 2014-06-07 DIAGNOSIS — L0231 Cutaneous abscess of buttock: Secondary | ICD-10-CM

## 2014-06-07 NOTE — Progress Notes (Signed)
Patient ID: Sarah Cardenas, female   DOB: 22-Jan-1986, 28 y.o.   MRN: [id number]  Chief Complaint  Patient presents with  . Cyst    HPI Sarah Cardenas is a 28 y.o. female.  The patient is a 28 year old female who is referred by Dr. Marisue Humble for an evaluation of a pilonidal cyst versus a gluteal abscess. Patient states since January she has had 2 episodes of abscess formation. Her initial abscess was I&D. Her recent abscess was approximate month ago. This spontaneously drained.  HPI  Past Medical History  Diagnosis Date  . Anxiety   . Depression     Past Surgical History  Procedure Laterality Date  . Breast surgery      lump removed  . Ear drum replacement      right  . Fracture surgery      History reviewed. No pertinent family history.  Social History History  Substance Use Topics  . Smoking status: Current Some Day Smoker    Types: Cigarettes  . Smokeless tobacco: Not on file  . Alcohol Use: Yes     Comment: occ    Allergies  Allergen Reactions  . Ciprofloxacin Other (See Comments)    numbness    Current Outpatient Prescriptions  Medication Sig Dispense Refill  . buPROPion (WELLBUTRIN XL) 150 MG 24 hr tablet       . HYDROcodone-acetaminophen (NORCO/VICODIN) 5-325 MG per tablet Take 1 tablet by mouth every 4 (four) hours as needed for moderate pain.  15 tablet  0  . ibuprofen (ADVIL,MOTRIN) 200 MG tablet Take 400 mg by mouth every 6 (six) hours as needed.      Marland Kitchen LORazepam (ATIVAN) 0.5 MG tablet Take 0.5 mg by mouth daily as needed. For anxiety      . NUVARING 0.12-0.015 MG/24HR vaginal ring       . sertraline (ZOLOFT) 100 MG tablet Take 200 mg by mouth daily.       No current facility-administered medications for this visit.    Review of Systems Review of Systems  Constitutional: Negative.   HENT: Negative.   Respiratory: Negative.   Cardiovascular: Negative.   Gastrointestinal: Negative.   Neurological: Negative.   All other systems reviewed and are  negative.   Blood pressure 116/84, pulse 94, temperature 98.7 F (37.1 C), temperature source Temporal, height 5\' 4"  (1.626 m), weight 205 lb 6 oz (93.157 kg).  Physical Exam Physical Exam  Constitutional: She is oriented to person, place, and time. She appears well-developed and well-nourished.  HENT:  Head: Normocephalic and atraumatic.  Eyes: Conjunctivae and EOM are normal. Pupils are equal, round, and reactive to light.  Neck: Normal range of motion. Neck supple.  Cardiovascular: Normal rate, regular rhythm and normal heart sounds.   Pulmonary/Chest: Effort normal and breath sounds normal.  Abdominal: Soft. Bowel sounds are normal. She exhibits no distension and no mass. There is no tenderness. There is no rebound and no guarding.  Musculoskeletal: Normal range of motion.  Neurological: She is alert and oriented to person, place, and time.  Skin: Skin is warm and dry.     Small punctate area, with no erythema or inflammation a  Psychiatric: She has a normal mood and affect.    Data Reviewed none  Assessment    28 year old female with a gluteal cleft abscess. The patient does not have the typical hair pattern associated with a pilonidal cyst.    Plan    1. A long discussion with the patient and her mother  as far as watchful waiting.  Today patient states that this is going better since a month ago. There is minimal infection and inflammation in the area at this time. Should this progress acutely again we can discuss incision and drainage and discuss excision of the chronically inflamed/infectious tissue. The patient wishes to proceed in this direction.        Marigene Ehlersamirez Jr., Kaylynne Andres 06/07/2014, 1:49 PM

## 2016-02-12 ENCOUNTER — Other Ambulatory Visit: Payer: Self-pay | Admitting: Surgery

## 2016-02-12 NOTE — H&P (Signed)
Despina Boan 02/12/2016 2:19 PM Location: [facility] Patient #: [id number] DOB: 10/16/1986 Single / Language: [place] / Race: White Female  History of Present Illness Ardeth Sportsman MD; 02/12/2016 2:54 PM) The patient is a 30 year old female who presents with a pilonidal cyst. Note for "Pilonidal cyst": Patient sent for surgical consultation requested by Dr. Elease Hashimoto. 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  Pleasant young female. Developed cystoscopy treat her tailbone 2015. That was the third of that year. 1st incised and drained. The last 2 times at spontaneously drained. Observation versus surgery discussed by my partner August 2015. Patient seems inclined for observation only.  Apparently she had another episode of pain and drainage this year. Went to primary care physician at [facility] physicians. 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 Started on Doxycycline. Surgical consultation requested by Louis Meckel, PA At [facility]. She started having these recurrent infections wishes to have something done. Since she started the doxycycline, the infection has calm down. She no longer smokes. She does not have diabetes. Moves her bowels every day. Has never had any abdominal hernia surgery. No anorectal problems. No hemorrhoids. No history of skin infections nor MRSA.   Other Problems Fay Records, CMA; 02/12/2016 2:25 PM) Anxiety Disorder Depression Lump In Breast  Past Surgical History Fay Records, CMA; 02/12/2016 2:25 PM) Breast Biopsy Left.  Diagnostic Studies History Fay Records, [place]; 02/12/2016 2:25 PM) Colonoscopy never Mammogram never Pap Smear 1-5 years ago  Allergies Fay Records, CMA; 02/12/2016 2:26 PM) Ciprofloxacin *Fluoroquinolones  Medication History Fay Records, CMA; 02/12/2016 2:26 PM) BuPROPion HCl ER (XL) (  Tablet ER 24HR, Oral) Active. LamoTRIgine (  Tablet, Oral) Active. Sertraline HCl (  Tablet, Oral)  Active. Medications Reconciled  Social History Fay Records, [place]; 02/12/2016 2:25 PM) Alcohol use Occasional alcohol use. Caffeine use Coffee. Illicit drug use Remotely quit drug use. Tobacco use Current some day smoker.  Family History Fay Records, [place]; 02/12/2016 2:25 PM) Alcohol Abuse Father. Depression Father, Mother.  Pregnancy / Birth History Fay Records, CMA; 02/12/2016 2:25 PM) Age at menarche 12 years. Contraceptive History Oral contraceptives. Gravida 0 Para 0 Regular periods     Review of Systems Fay Records CMA; 02/12/2016 2:25 PM) General Not Present- Appetite Loss, Chills, Fatigue, Fever, Night Sweats, Weight Gain and Weight Loss. Skin Not Present- Change in Wart/Mole, Dryness, Hives, Jaundice, New Lesions, Non-Healing Wounds, Rash and Ulcer. HEENT Not Present- Earache, Hearing Loss, Hoarseness, Nose Bleed, Oral Ulcers, Ringing in the Ears, Seasonal Allergies, Sinus Pain, Sore Throat, Visual Disturbances, Wears glasses/contact lenses and Yellow Eyes. Respiratory Not Present- Bloody sputum, Chronic Cough, Difficulty Breathing, Snoring and Wheezing. Breast Not Present- Breast Mass, Breast Pain, Nipple Discharge and Skin Changes. Cardiovascular Not Present- Chest Pain, Difficulty Breathing Lying Down, Leg Cramps, Palpitations, Rapid Heart Rate, Shortness of Breath and Swelling of Extremities. Gastrointestinal Not Present- Abdominal Pain, Bloating, Bloody Stool, Change in Bowel Habits, Chronic diarrhea, Constipation, Difficulty Swallowing, Excessive gas, Gets full quickly at meals, Hemorrhoids, Indigestion, Nausea, Rectal Pain and Vomiting. Female Genitourinary Not Present- Frequency, Nocturia, Painful Urination, Pelvic Pain and Urgency. Musculoskeletal Not Present- Back Pain, Joint Pain, Joint Stiffness, Muscle Pain, Muscle Weakness and Swelling of Extremities. Neurological Not Present- Decreased Memory, Fainting, Headaches, Numbness, Seizures, Tingling, Tremor,  Trouble walking and Weakness. Psychiatric Not Present- Anxiety, Bipolar, Change in Sleep Pattern, Depression, Fearful and Frequent crying. Endocrine Not Present- Cold Intolerance, Excessive Hunger, Hair Changes, Heat Intolerance, Hot flashes and New Diabetes. Hematology Not Present- Easy Bruising, Excessive bleeding, Gland problems, HIV and Persistent  Infections.  Vitals Fay Records(Ashley Beck CMA; 02/12/2016 2:27 PM) 02/12/2016 2:26 PM Weight: 228 lb Height: 65in Body Surface Area: 2.09 m Body Mass Index: 37.94 kg/m  Temp.: 98.41F  Pulse: 95 (Regular)  BP: 130/68 (Sitting, Left Arm, Standard)      Physical Exam Ardeth Sportsman(Yasheka Fossett C. Myley Bahner MD; 02/12/2016 2:48 PM)  General Mental Status-Alert. General Appearance-Not in acute distress, Not Sickly. Orientation-Oriented X3. Hydration-Well hydrated. Voice-Normal.  Integumentary Global Assessment Upon inspection and palpation of skin surfaces of the - Axillae: non-tender, no inflammation or ulceration, no drainage. and Distribution of scalp and body hair is normal. General Characteristics Temperature - normal warmth is noted. Note: Skin with good hygiene. No rash. No yeast infection. No hydradenitis. Axillae and groins and perianal region otherwise clear.  Head and Neck Head-normocephalic, atraumatic with no lesions or palpable masses. Face Global Assessment - atraumatic, no absence of expression. Neck Global Assessment - no abnormal movements, no bruit auscultated on the right, no bruit auscultated on the left, no decreased range of motion, non-tender. Trachea-midline. Thyroid Gland Characteristics - non-tender.  Eye Eyeball - Left-Extraocular movements intact, No Nystagmus. Eyeball - Right-Extraocular movements intact, No Nystagmus. Cornea - Left-No Hazy. Cornea - Right-No Hazy. Sclera/Conjunctiva - Left-No scleral icterus, No Discharge. Sclera/Conjunctiva - Right-No scleral icterus, No Discharge. Pupil -  Left-Direct reaction to light normal. Pupil - Right-Direct reaction to light normal.  ENMT Ears Pinna - Left - no drainage observed, no generalized tenderness observed. Right - no drainage observed, no generalized tenderness observed. Nose and Sinuses External Inspection of the Nose - no destructive lesion observed. Inspection of the nares - Left - quiet respiration. Right - quiet respiration. Mouth and Throat Lips - Upper Lip - no fissures observed, no pallor noted. Lower Lip - no fissures observed, no pallor noted. Nasopharynx - no discharge present. Oral Cavity/Oropharynx - Tongue - no dryness observed. Oral Mucosa - no cyanosis observed. Hypopharynx - no evidence of airway distress observed.  Chest and Lung Exam Inspection Movements - Normal and Symmetrical. Accessory muscles - No use of accessory muscles in breathing. Palpation Palpation of the chest reveals - Non-tender. Auscultation Breath sounds - Normal and Clear.  Cardiovascular Auscultation Rhythm - Regular. Murmurs & Other Heart Sounds - Auscultation of the heart reveals - No Murmurs and No Systolic Clicks.  Abdomen Inspection Inspection of the abdomen reveals - No Visible peristalsis and No Abnormal pulsations. Umbilicus - No Bleeding, No Urine drainage. Palpation/Percussion Palpation and Percussion of the abdomen reveal - Soft, Non Tender, No Rebound tenderness, No Rigidity (guarding) and No Cutaneous hyperesthesia. Note: Abdomen soft. Nontender, nondistended. No gurding. No hernias. Hold supraumbilical piercing closed. Tattoo on right flank.  Female Genitourinary Sexual Maturity Tanner 5 - Adult hair pattern. Note: No vaginal bleeding nor discharge  Rectal Note: Large buttocks with deep intergluteal cleft. Scarring at the upper mid sacrum in the cleft consistent with pilonidal disease. No major pits. No hair burden. No cellulitis or inflammation.   Exam done with assistance of female Medical Assistant in  the room. Perianal skin clean with good hygiene. No pruritis ani. No fissure. No abscess/fistula. Normal sphincter tone. I held off on any digital and anoscopic rectal exam. No external hemorrhoids  Peripheral Vascular Upper Extremity Inspection - Left - No Cyanotic nailbeds, Not Ischemic. Right - No Cyanotic nailbeds, Not Ischemic.  Neurologic Neurologic evaluation reveals -normal attention span and ability to concentrate, able to name objects and repeat phrases. Appropriate fund of knowledge , normal sensation and normal coordination. Mental Status  Affect - not angry, not paranoid. Cranial Nerves-Normal Bilaterally. Gait-Normal.  Neuropsychiatric Mental status exam performed with findings of-able to articulate well with normal speech/language, rate, volume and coherence, thought content normal with ability to perform basic computations and apply abstract reasoning and no evidence of hallucinations, delusions, obsessions or homicidal/suicidal ideation.  Musculoskeletal Global Assessment Spine, Ribs and Pelvis - no instability, subluxation or laxity. Right Upper Extremity - no instability, subluxation or laxity.  Lymphatic Head & Neck  General Head & Neck Lymphatics: Bilateral - Description - No Localized lymphadenopathy. Axillary  General Axillary Region: Bilateral - Description - No Localized lymphadenopathy. Femoral & Inguinal  Generalized Femoral & Inguinal Lymphatics: Left - Description - No Localized lymphadenopathy. Right - Description - No Localized lymphadenopathy.    Assessment & Plan Ardeth Sportsman MD; 02/12/2016 2:51 PM)  PILONIDAL CYST WITHOUT INFECTION (L05.91) Impression: Pleasant young woman with recurrent episodes of pilonidal infections every few months or so for the past several years. Wishes something more definitive done.  I think that she is reasonable candidate for excision with layered closure over umbilical tape wick drains. Hopefully because  she does not smoke anymore and her hair burden is minimal, risk of recurrence is less. We will work to a convenient time.  Current Plans You are being scheduled for surgery - Our schedulers will call you.  You should hear from our office's scheduling department within 5 working days about the location, date, and time of surgery. We try to make accommodations for patient's preferences in scheduling surgery, but sometimes the OR schedule or the surgeon's schedule prevents [place] from making those accommodations.  If you have not heard from our office [phone]) in 5 working days, call the office and ask for your surgeon's nurse.  If you have other questions about your diagnosis, plan, or surgery, call the office and ask for your surgeon's nurse.  Pt Education - [facility] Rectal Prep for Anorectal outpatient/office surgery: discussed with patient and provided information. The anatomy of the gluteal and sacral region was discussed. Pathophysiology of pilonidal disease due to ingrown hairs was discussed. Importance of good hygiene discussed. Importance of keeping hairs trimmed in the intergluteal crease from anus to top of sacrum/tailbone spine noted. Need for good hygiene stressed. Use of electric razor or nose hair trimmers to keep the hairs trimmed discussed.  Risk of worsening progression needing surgical drainage of abscess or perhaps excision of chronic pilonidal disease with skin flap closure over drains discussed. Possible need for her to leave it open with packing to allow the wound close over several weeks/months discussed. Risks benefits alternatives to surgery discussed as well. Risk of recurrent disease discussed. Patient was expressed understanding. Literature given to patient. We will see if we can control this without an operation first.  Pt Education - [facility] Pilonidal Disease (AT) Pt Education - [facility] Pilonidal Surgery HCI - post op instructions: discussed with patient and provided  information.  Ardeth Sportsman, M.D., F.A.C.S. Gastrointestinal and Minimally Invasive Surgery [facility] [address], [address] [place], [place] [address] [phone] Main / Paging

## 2016-06-27 ENCOUNTER — Other Ambulatory Visit: Payer: Self-pay | Admitting: Surgery

## 2017-03-27 ENCOUNTER — Ambulatory Visit (HOSPITAL_BASED_OUTPATIENT_CLINIC_OR_DEPARTMENT_OTHER)
Admission: RE | Admit: 2017-03-27 | Discharge: 2017-03-27 | Disposition: A | Discharge status: Home / Self Care | Payer: Self-pay | Source: Ambulatory Visit | Attending: Family Medicine | Admitting: Family Medicine

## 2017-03-27 ENCOUNTER — Other Ambulatory Visit (HOSPITAL_BASED_OUTPATIENT_CLINIC_OR_DEPARTMENT_OTHER): Payer: Self-pay | Admitting: Family Medicine

## 2017-03-27 DIAGNOSIS — M7989 Other specified soft tissue disorders: Secondary | ICD-10-CM | POA: Insufficient documentation

## 2017-10-07 ENCOUNTER — Other Ambulatory Visit (HOSPITAL_COMMUNITY): Payer: No Typology Code available for payment source | Attending: Psychiatry | Admitting: Psychiatry

## 2017-10-07 ENCOUNTER — Encounter (HOSPITAL_COMMUNITY): Payer: Self-pay | Admitting: Psychiatry

## 2017-10-07 DIAGNOSIS — F3130 Bipolar disorder, current episode depressed, mild or moderate severity, unspecified: Secondary | ICD-10-CM

## 2017-10-07 DIAGNOSIS — Z9889 Other specified postprocedural states: Secondary | ICD-10-CM | POA: Insufficient documentation

## 2017-10-07 DIAGNOSIS — F17210 Nicotine dependence, cigarettes, uncomplicated: Secondary | ICD-10-CM | POA: Diagnosis not present

## 2017-10-07 DIAGNOSIS — Z79899 Other long term (current) drug therapy: Secondary | ICD-10-CM | POA: Diagnosis not present

## 2017-10-07 NOTE — Progress Notes (Signed)
    Daily Group Progress Note  Program: IOP  Group Time: 9:00-12:00  Participation Level: Active  Behavioral Response: Appropriate  Type of Therapy:  Group Therapy  Summary of Progress: Pt. Participated in intake assessment with the case manager and the psychiatrist. Pt. Participated in group discussion about developing healthy relationship boundaries and indicated that this had been a challenge for her in the past. Pt. Discussed that she wanted to learn how to better manage her thoughts that she felt were "out of her control". Pt. Participated in grief and loss group with the Chaplain.     Shaune PollackBrown, Jennifer B, LPC

## 2017-10-07 NOTE — Progress Notes (Signed)
Psychiatric Initial Adult Assessment   Patient Identification: Sarah Cardenas MRN:  [id number] Date of Evaluation:  10/07/2017 Referral Source: self Chief Complaint:   Visit Diagnosis:    ICD-10-CM   1. Bipolar I disorder, most recent episode depressed (HCC) F31.30     History of Present Illness:  Sarah Cardenas has been depressed all her life she says.  The current episode seems to have started with the death of her father 2 years ago.  They were not close and she had not talked to him for 2 years.  He killed himself and she felt bad that he was so unhappy and died that way.  She is close to her mother and siblings.  She is sad, has crying spells, decreased interest, motivation and energy, erratic sleep, increased appetite and 30 lb weight gain in a year.  She has some suicidal thoughts but no intent.  Her life is good otherwise. The depression seems to be not connected to any precipitants.  Associated Signs/Symptoms: Depression Symptoms:  depressed mood, anhedonia, insomnia, hypersomnia, fatigue, difficulty concentrating, impaired memory, suicidal thoughts without plan, anxiety, disturbed sleep, weight gain, (Hypo) Manic Symptoms:  Irritable Mood, Anxiety Symptoms:  Excessive Worry, Psychotic Symptoms:  none PTSD Symptoms: Negative  Past Psychiatric History: inpatient and outpatient therapy for years  Previous Psychotropic Medications: Yes   Substance Abuse History in the last 12 months:  No.  Consequences of Substance Abuse: Negative  Past Medical History:  Past Medical History:  Diagnosis Date  . Anxiety   . Depression     Past Surgical History:  Procedure Laterality Date  . BREAST SURGERY     lump removed  . ear drum replacement     right  . FRACTURE SURGERY      Family Psychiatric History: depression on father's side  Family History: No family history on file.  Social History:   Social History   Socioeconomic History  . Marital status: Single    Spouse  name: Not on file  . Number of children: Not on file  . Years of education: Not on file  . Highest education level: Not on file  Social Needs  . Financial resource strain: Not on file  . Food insecurity - worry: Not on file  . Food insecurity - inability: Not on file  . Transportation needs - medical: Not on file  . Transportation needs - non-medical: Not on file  Occupational History  . Not on file  Tobacco Use  . Smoking status: Current Some Day Smoker    Types: Cigarettes  Substance and Sexual Activity  . Alcohol use: Yes    Comment: occ  . Drug use: No  . Sexual activity: Not on file  Other Topics Concern  . Not on file  Social History Narrative  . Not on file    Additional Social History: none  Allergies:   Allergies  Allergen Reactions  . Ciprofloxacin Other (See Comments)    numbness    Metabolic Disorder Labs: No results found for: HGBA1C, MPG No results found for: PROLACTIN No results found for: CHOL, TRIG, HDL, CHOLHDL, VLDL, LDLCALC   Current Medications: Current Outpatient Medications  Medication Sig Dispense Refill  . buPROPion (WELLBUTRIN XL) 150 MG 24 hr tablet     . HYDROcodone-acetaminophen (NORCO/VICODIN) 5-325 MG per tablet Take 1 tablet by mouth every 4 (four) hours as needed for moderate pain. 15 tablet 0  . ibuprofen (ADVIL,MOTRIN) 200 MG tablet Take 400 mg by mouth every 6 (six)  hours as needed.    Marland Kitchen. LORazepam (ATIVAN) 0.5 MG tablet Take 0.5 mg by mouth daily as needed. For anxiety    . NUVARING 0.12-0.015 MG/24HR vaginal ring     . sertraline (ZOLOFT) 100 MG tablet Take 200 mg by mouth daily.     No current facility-administered medications for this visit.     Neurologic: Headache: Negative Seizure: Negative Paresthesias:Negative  Musculoskeletal: Strength & Muscle Tone: within normal limits Gait & Station: normal Patient leans: N/A  Psychiatric Specialty Exam: ROS  There were no vitals taken for this visit.There is no height  or weight on file to calculate BMI.  General Appearance: Well Groomed  Eye Contact:  Good  Speech:  Clear and Coherent  Volume:  Normal  Mood:  Depressed  Affect:  Appropriate  Thought Process:  Coherent and Goal Directed  Orientation:  Full (Time, Place, and Person)  Thought Content:  Logical  Suicidal Thoughts:  Yes.  without intent/plan  Homicidal Thoughts:  No  Memory:  Immediate;   Good Recent;   Good Remote;   Good  Judgement:  Intact  Insight:  Good  Psychomotor Activity:  Normal  Concentration:  Concentration: Good and Attention Span: Good  Recall:  Good  Fund of Knowledge:Good  Language: Good  Akathisia:  Negative  Handed:  Right  AIMS (if indicated):  0  Assets:  Communication Skills Desire for Improvement Financial Resources/Insurance Housing Leisure Time Physical Health Resilience Social Support Talents/Skills Transportation Vocational/Educational  ADL's:  Intact  Cognition: WNL  Sleep:  poor    Treatment Plan Summary: Admit to IOP with daily group therapy   Carolanne GrumblingGerald Taylor, MD 12/18/20182:19 PM

## 2017-10-07 NOTE — Progress Notes (Signed)
Comprehensive Clinical Assessment (CCA) Note  10/07/2017 Sarah Cardenas [id number]  Visit Diagnosis:      ICD-10-CM   1. Bipolar I disorder, most recent episode depressed (HCC) F31.30       CCA Part One  Part One has been completed on paper by the patient.  (See scanned document in Chart Review)  CCA Part Two A  Intake/Chief Complaint:  CCA Intake With Chief Complaint CCA Part Two Date: 10/07/17 CCA Part Two Time: 1434 Chief Complaint/Presenting Problem: This is a 31 yr old, single, employed, Caucasian female; who was referred per Hal Neer, LPC and Tamela Oddi, PA-C; treatment for worsening depressive symptoms.  According to pt, she was recently a pt at [facility] (Oct 2018) for med adjustment.  States she's been depressed all her life; but this episode has lasted for ~ one year.  Has been seeing Tamela Oddi, PA-C and Hal Neer, [facility] for five years.  Admits to passive SI.  Denies any plan or intent.  Discussed safety options with pt.  Pt able to contract for safety.  One prior suicide attempt/gestures (cutting) at age 53.  Pt was admitted at [facility] under the services of Dr. Carolanne Grumbling.  Family Hx:  Deceased Father (Bipolar D/O and Addiction).  He committed suicide two yrs ago tomorrow.  Trigger/Stressor:  1) Anniversary of father's death (2015/10/19).  He hung himself and his best friend found him.                                                                                  Patients Currently Reported Symptoms/Problems: Passive SI, increased appetite, low energy, isolvative, anhedonia, poor concentratiion, crying spells, ruminating thoughts, anxious, poor sleep (night terrors) Collateral Involvement: States mother is supportive. Individual's Strengths: Pt enjoys being around people, but also likes her alone time.  Motivated for treatment. Individual's Preferences: Being alone. Type of Services Patient Feels Are Needed: MH-IOP  Mental Health Symptoms Depression:   Depression: Change in energy/activity, Difficulty Concentrating, Increase/decrease in appetite, Irritability, Sleep (too much or little), Tearfulness, Weight gain/loss  Mania:  Mania: N/A  Anxiety:   Anxiety: Worrying  Psychosis:  Psychosis: N/A  Trauma:  Trauma: N/A  Obsessions:  Obsessions: N/A  Compulsions:  Compulsions: N/A  Inattention:  Inattention: N/A  Hyperactivity/Impulsivity:  Hyperactivity/Impulsivity: N/A  Oppositional/Defiant Behaviors:  Oppositional/Defiant Behaviors: N/A  Borderline Personality:  Emotional Irregularity: N/A  Other Mood/Personality Symptoms:      Mental Status Exam Appearance and self-care  Stature:  Stature: Average  Weight:  Weight: Overweight  Clothing:  Clothing: Casual  Grooming:  Grooming: Normal  Cosmetic use:  Cosmetic Use: None  Posture/gait:  Posture/Gait: Normal  Motor activity:  Motor Activity: Not Remarkable  Sensorium  Attention:  Attention: Normal  Concentration:  Concentration: Normal  Orientation:  Orientation: X5  Recall/memory:  Recall/Memory: Normal  Affect and Mood  Affect:  Affect: Appropriate  Mood:  Mood: Depressed  Relating  Eye contact:  Eye Contact: Normal  Facial expression:  Facial Expression: Responsive  Attitude toward examiner:  Attitude Toward Examiner: Cooperative  Thought and Language  Speech flow: Speech Flow: Normal  Thought content:  Thought Content: Appropriate to mood and circumstances  Preoccupation:     Hallucinations:     Organization:     Company secretaryxecutive Functions  Fund of Knowledge:  Fund of Knowledge: Average  Intelligence:  Intelligence: Average  Abstraction:  Abstraction: Normal  Judgement:  Judgement: Normal  Reality Testing:  Reality Testing: Adequate  Insight:  Insight: Good  Decision Making:  Decision Making: Normal  Social Functioning  Social Maturity:  Social Maturity: Isolates, Responsible  Social Judgement:  Social Judgement: Normal  Stress  Stressors:  Stressors: Illness  Coping  Ability:  Coping Ability: Building surveyorverwhelmed  Skill Deficits:     Supports:      Family and Psychosocial History: Family history Marital status: Single Are you sexually active?: No What is your sexual orientation?: heterosexual Does patient have children?: No  Childhood History:  Childhood History By whom was/is the patient raised?: Mother Additional childhood history information: Parents divorced when pt was in fifth grade.  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 Description of patient's relationship with caregiver when they were a child: Very close to mother.  Father was Bipolar with addiction. 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?: No  Education: Education Did Garment/textile technologistYou Graduate From [employer] School?: Yes Did Theme park managerYou Attend College?: Yes Did Designer, television/film setYou Attend Graduate School?: No Did You Have An Individualized Education Program (IIEP): No Did You Have Any Difficulty At [employer] EnergySchool?: Yes Were Any Medications Ever Prescribed For These Difficulties?: Yes Medications Prescribed For School  Difficulties?: Med for ADD  Religion: Religion/Spirituality Are You A Religious Person?: Yes What is Your Religious Affiliation?: ChiropodistChristian  Leisure/Recreation: Leisure / Recreation Leisure and Hobbies: Adult coloring books  Exercise/Diet: Exercise/Diet Do You Exercise?: No Have You Gained or Lost A Significant Amount of Weight in the Past Six Months?: Yes-Gained Number of Pounds Gained: 30 Do You Follow a Special Diet?: No Do You Have Any Trouble Sleeping?: Yes Explanation of Sleeping Difficulties: difficulty getting to sleep; night terrors  CCA Part Two C  Alcohol/Drug Use: Alcohol / Drug Use Prescriptions: cc MAR History of alcohol / drug use?: No history of alcohol / drug abuse                      CCA Part Three  ASAM's:  Six Dimensions of Multidimensional Assessment  Dimension 1:  Acute Intoxication and/or Withdrawal Potential:     Dimension 2:  Biomedical Conditions and Complications:     Dimension 3:  Emotional, Behavioral, or Cognitive Conditions and Complications:     Dimension 4:  Readiness to Change:     Dimension 5:  Relapse, Continued use, or Continued Problem Potential:     Dimension 6:  Recovery/Living Environment:  Substance use Disorder (SUD)    Social Function:  Social Functioning Social Maturity: Isolates, Responsible Social Judgement: Normal  Stress:  Stress Stressors: Illness Coping Ability: Overwhelmed Patient Takes Medications The Way The Doctor Instructed?: Yes Priority Risk: Moderate Risk  Risk Assessment- Self-Harm Potential: Risk Assessment For Self-Harm Potential Thoughts of Self-Harm: Vague current thoughts Method: No plan Availability of Means: No access/NA Additional Information for Self-Harm Potential: Family History of Suicide, Previous Attempts Additional Comments for Self-Harm Potential: Pt able to contract for safety  Risk Assessment -Dangerous to Others Potential: Risk Assessment For Dangerous to Others  Potential Method: No Plan Availability of Means: No access or NA Intent: Vague intent or NA Notification Required: No need or identified person  DSM5 Diagnoses: There are no active problems to display for this patient.   Patient Centered Plan: Patient is on the following Treatment Plan(s):  Depression  Recommendations for Services/Supports/Treatments: Recommendations for Services/Supports/Treatments Recommendations For Services/Supports/Treatments: IOP (Intensive Outpatient Program)  Treatment Plan Summary:  oriented pt.  Provided pt with an orientation folder.  Informed providers of admit.  F/u with Tamela OddiJo Hughes, PA and Hal Neerebecca Austin, LPC.  Encouraged support groups.  Referrals to Alternative Service(s): Referred to Alternative Service(s):   Place:   Date:   Time:    Referred to Alternative Service(s):   Place:   Date:   Time:    Referred to Alternative Service(s):   Place:   Date:   Time:    Referred to Alternative Service(s):   Place:   Date:   Time:     Jeri ModenaLARK, RITA, M.Ed,CNA

## 2017-10-08 ENCOUNTER — Other Ambulatory Visit (HOSPITAL_COMMUNITY): Payer: No Typology Code available for payment source | Admitting: Psychiatry

## 2017-10-08 DIAGNOSIS — F3130 Bipolar disorder, current episode depressed, mild or moderate severity, unspecified: Secondary | ICD-10-CM

## 2017-10-08 NOTE — Progress Notes (Signed)
    Daily Group Progress Note  Program: IOP  Group Time: 9:00-12:00  Participation Level: Active  Behavioral Response: Appropriate; Resistant  Type of Therapy:  Group Therapy  Summary of Progress: Pt. Presents as quiet, anxious, resistant to therapeutic process. Pt. Participated in discussion about perfectionism. Therapist challenged each group member to identify an activity that they might be willing to engage in imperfectly. Pt. Indicated that she did not know if she would be able to do that, but reluctantly stated "work, because I am already failing at that". Pt. Indicated that she felt that she was failing at work because she has belief that she is supposed to be happy, nice and helpful all of the time and currently is not able to be that way. Pt. Was encouraged to give herself some grace and challenge her beliefs that "Nice people always say yes". Pt. Discussed that today is the anniversary of her father's death who suffered from addiction and bipolar disorder. Pt. Has very conflicted feelings over his loss and lack of closure surrounding his death. Pt. Discussed her plan for how she would spend today (with a friend and exercising self-care) and Pt. Received encouragement from the therapist and from the group to see her grief process and seeking closure in her relationship with her father as ongoing. Pt. Was present during breath-focused meditation for stress reduction and grounding. Pt. Indicated that she did not participate in the meditation because she does not understand the purpose of sitting with thoughts in silence. Pt. Was provided with psychoeducation about the benefits of breath focused meditation for the nervous system.    Shaune PollackBrown, Jennifer B, LPC

## 2017-10-09 ENCOUNTER — Other Ambulatory Visit (HOSPITAL_COMMUNITY): Payer: No Typology Code available for payment source | Admitting: Psychiatry

## 2017-10-09 DIAGNOSIS — F3130 Bipolar disorder, current episode depressed, mild or moderate severity, unspecified: Secondary | ICD-10-CM

## 2017-10-09 NOTE — Progress Notes (Signed)
    Daily Group Progress Note  Program: IOP  Group Time: 9:00-12:00  Participation Level: Active  Behavioral Response: Appropriate  Type of Therapy:  Group Therapy  Summary of Progress: Pt. Presents with brightened affect, mildly resistant to group process. Pt. Discussed that she went to get her nails done yesterday and did some laundry which was enjoyable to her. Pt. Shared that she is challenged by thoughts of not mattering to herself or to others. Pt. Is resistant to processing her thoughts deeply in the group process, but is supportive of others. Pt. Participated in yoga therapy with Forde RadonLeanne Yates, LPC.     Shaune PollackBrown, Jennifer B, LPC

## 2017-10-10 ENCOUNTER — Other Ambulatory Visit (HOSPITAL_COMMUNITY): Payer: No Typology Code available for payment source | Admitting: Psychiatry

## 2017-10-10 DIAGNOSIS — F3130 Bipolar disorder, current episode depressed, mild or moderate severity, unspecified: Secondary | ICD-10-CM

## 2017-10-13 ENCOUNTER — Other Ambulatory Visit (HOSPITAL_COMMUNITY): Payer: No Typology Code available for payment source | Admitting: Psychiatry

## 2017-10-13 NOTE — Progress Notes (Signed)
    Daily Group Progress Note  Program: IOP  Group Time: 9:00-12:00  Participation Level: Active  Behavioral Response: Appropriate  Type of Therapy:  Group Therapy  Summary of Progress: Pt. Presents as talkative, smiled appropriately, less resistant to therapeutic process compared to previous group. Pt. Shared with the group that she felt "worn out" emotionally and physically. Pt. Attributed that she is continuing to work while in therapy. Pt. Discussed her relationship with her mother who wants her to get back to work quickly and has a difficult time understanding the emotional energy expended during therapy. Pt. Discussed ongoing relationships with girlfriend group that disappointed her when they did not communicate with her when she shared with them that she was experiencing a crisis with her depression. Pt. Received feedback from the counselor about how she might be more assertive with her friends about asking for what she needs from them in the future. Pt. Participated in grounding exercise "This is what I know to be true about myself today".     Shaune PollackBrown, Jennifer B, LPC

## 2017-10-15 ENCOUNTER — Other Ambulatory Visit (HOSPITAL_COMMUNITY): Payer: No Typology Code available for payment source

## 2017-10-16 ENCOUNTER — Other Ambulatory Visit (HOSPITAL_COMMUNITY): Payer: No Typology Code available for payment source

## 2017-10-17 ENCOUNTER — Other Ambulatory Visit (HOSPITAL_COMMUNITY): Payer: No Typology Code available for payment source

## 2017-10-20 ENCOUNTER — Other Ambulatory Visit (HOSPITAL_COMMUNITY): Payer: No Typology Code available for payment source

## 2017-10-22 ENCOUNTER — Other Ambulatory Visit (HOSPITAL_COMMUNITY): Payer: No Typology Code available for payment source

## 2017-10-23 ENCOUNTER — Other Ambulatory Visit (HOSPITAL_COMMUNITY): Payer: No Typology Code available for payment source

## 2017-10-24 ENCOUNTER — Other Ambulatory Visit (HOSPITAL_COMMUNITY): Payer: No Typology Code available for payment source

## 2017-10-27 ENCOUNTER — Other Ambulatory Visit (HOSPITAL_COMMUNITY): Payer: No Typology Code available for payment source

## 2017-10-28 ENCOUNTER — Other Ambulatory Visit (HOSPITAL_COMMUNITY): Payer: No Typology Code available for payment source

## 2017-10-28 NOTE — Progress Notes (Signed)
Sarah Cardenas is a 32 y.o. , single, employed, Caucasian female; who was referred per Hal Neerebecca Austin, LPC and Tamela OddiJo Hughes, PA-C; treatment for worsening depressive symptoms.  According to pt, she was recently a pt at [facility] (Oct 2018) for med adjustment.  States she's been depressed all her life; but this episode has lasted for ~ one year.  Has been seeing Tamela OddiJo Hughes, PA-C and [facility], [facility].  Admits to passive SI.  Denies any plan or intent.  Discussed safety options with pt.  Pt able to contract for safety.  One prior suicide attempt/gestures (cutting) at age 32.  [facility] under the services of Dr. Carolanne GrumblingGerald Taylor.  Family Hx:  Deceased Father (Bipolar D/O and Addiction).  He committed suicide two yrs ago tomorrow.  Trigger/Stressor:  1) Anniversary of father's death (10-09-15).  He hung himself and his best friend found him.                                                                                  Patient didn't return to MH-IOP after the Christmas holiday, nor has she returned writer's phone calls.  A:  Discharge patient due to non-compliancy with attendance.  F/U with Tamela OddiJo Hughes, PA-C and [facility], [facility].  Encouraged support groups.         Chestine SporeLARK, RITA, M.Ed, CNA

## 2017-10-29 ENCOUNTER — Other Ambulatory Visit (HOSPITAL_COMMUNITY): Payer: No Typology Code available for payment source

## 2017-10-29 NOTE — Progress Notes (Signed)
Patient ID: Sarah BeltonAmber Ervine, female   DOB: 01-28-1986, 32 y.o.   MR[facility]: [id number] [facility]  Patient:  Sarah Beltonmber Lingelbach DOB:  01-28-1986  Date of Admission: 10/07/2017  Date of Discharge: 10/28/2017  Reason for Admission:depression   IOP Course:attended and participated till the Christmas break and then did not return or return phone calls for follow up.  Did well while attending with no suicidal thinking the last day she was here  Mental Status at Discharge:did not return  Diagnosis: bipolar 1 disorder, depressed  Level of Care:  IOP  Discharge destination: has appointments with her provider and therapist   Comments:  Did not complete the program  The patient received suicide prevention pamphlet:  Yes   Carolanne GrumblingGerald Demetrick Eichenberger, MD

## 2017-10-30 ENCOUNTER — Other Ambulatory Visit (HOSPITAL_COMMUNITY): Payer: No Typology Code available for payment source

## 2017-10-31 ENCOUNTER — Other Ambulatory Visit (HOSPITAL_COMMUNITY): Payer: No Typology Code available for payment source

## 2017-11-03 ENCOUNTER — Other Ambulatory Visit (HOSPITAL_COMMUNITY): Payer: No Typology Code available for payment source

## 2017-11-04 ENCOUNTER — Other Ambulatory Visit (HOSPITAL_COMMUNITY): Payer: No Typology Code available for payment source

## 2017-11-05 ENCOUNTER — Other Ambulatory Visit (HOSPITAL_COMMUNITY): Payer: No Typology Code available for payment source

## 2017-11-06 ENCOUNTER — Other Ambulatory Visit (HOSPITAL_COMMUNITY): Payer: No Typology Code available for payment source

## 2017-11-07 ENCOUNTER — Other Ambulatory Visit (HOSPITAL_COMMUNITY): Payer: No Typology Code available for payment source

## 2017-11-10 ENCOUNTER — Other Ambulatory Visit (HOSPITAL_COMMUNITY): Payer: No Typology Code available for payment source

## 2017-11-11 ENCOUNTER — Other Ambulatory Visit (HOSPITAL_COMMUNITY): Payer: No Typology Code available for payment source

## 2017-11-12 ENCOUNTER — Other Ambulatory Visit (HOSPITAL_COMMUNITY): Payer: No Typology Code available for payment source

## 2017-11-13 ENCOUNTER — Other Ambulatory Visit (HOSPITAL_COMMUNITY): Payer: No Typology Code available for payment source

## 2017-11-14 ENCOUNTER — Other Ambulatory Visit (HOSPITAL_COMMUNITY): Payer: No Typology Code available for payment source

## 2017-11-17 ENCOUNTER — Other Ambulatory Visit (HOSPITAL_COMMUNITY): Payer: No Typology Code available for payment source

## 2017-11-18 ENCOUNTER — Other Ambulatory Visit (HOSPITAL_COMMUNITY): Payer: No Typology Code available for payment source

## 2017-11-19 ENCOUNTER — Other Ambulatory Visit (HOSPITAL_COMMUNITY): Payer: No Typology Code available for payment source

## 2017-11-20 ENCOUNTER — Other Ambulatory Visit (HOSPITAL_COMMUNITY): Payer: No Typology Code available for payment source

## 2017-11-21 ENCOUNTER — Other Ambulatory Visit (HOSPITAL_COMMUNITY): Payer: No Typology Code available for payment source

## 2017-11-24 ENCOUNTER — Other Ambulatory Visit (HOSPITAL_COMMUNITY): Payer: No Typology Code available for payment source

## 2017-11-25 ENCOUNTER — Other Ambulatory Visit (HOSPITAL_COMMUNITY): Payer: No Typology Code available for payment source

## 2017-11-26 ENCOUNTER — Other Ambulatory Visit (HOSPITAL_COMMUNITY): Payer: No Typology Code available for payment source

## 2017-11-27 ENCOUNTER — Other Ambulatory Visit (HOSPITAL_COMMUNITY): Payer: No Typology Code available for payment source

## 2017-11-28 ENCOUNTER — Other Ambulatory Visit (HOSPITAL_COMMUNITY): Payer: No Typology Code available for payment source

## 2017-12-01 ENCOUNTER — Other Ambulatory Visit (HOSPITAL_COMMUNITY): Payer: No Typology Code available for payment source

## 2018-09-15 LAB — HM PAP SMEAR: HM Pap smear: NEGATIVE

## 2018-12-07 ENCOUNTER — Encounter: Payer: Self-pay | Admitting: Physician Assistant

## 2018-12-07 ENCOUNTER — Ambulatory Visit (INDEPENDENT_AMBULATORY_CARE_PROVIDER_SITE_OTHER): Payer: No Typology Code available for payment source | Admitting: Physician Assistant

## 2018-12-07 VITALS — BP 110/80 | HR 72 | Temp 97.6°F | Ht 64.0 in | Wt 254.0 lb

## 2018-12-07 DIAGNOSIS — Z13220 Encounter for screening for lipoid disorders: Secondary | ICD-10-CM

## 2018-12-07 DIAGNOSIS — Z136 Encounter for screening for cardiovascular disorders: Secondary | ICD-10-CM

## 2018-12-07 DIAGNOSIS — Z23 Encounter for immunization: Secondary | ICD-10-CM

## 2018-12-07 DIAGNOSIS — D649 Anemia, unspecified: Secondary | ICD-10-CM | POA: Diagnosis not present

## 2018-12-07 DIAGNOSIS — E669 Obesity, unspecified: Secondary | ICD-10-CM

## 2018-12-07 DIAGNOSIS — Z0001 Encounter for general adult medical examination with abnormal findings: Secondary | ICD-10-CM | POA: Diagnosis not present

## 2018-12-07 DIAGNOSIS — K529 Noninfective gastroenteritis and colitis, unspecified: Secondary | ICD-10-CM

## 2018-12-07 DIAGNOSIS — N870 Mild cervical dysplasia: Secondary | ICD-10-CM | POA: Insufficient documentation

## 2018-12-07 LAB — CBC WITH DIFFERENTIAL/PLATELET
Basophils Absolute: 0.1 10*3/uL (ref 0.0–0.1)
Basophils Relative: 0.6 % (ref 0.0–3.0)
EOS ABS: 0.3 10*3/uL (ref 0.0–0.7)
Eosinophils Relative: 2.2 % (ref 0.0–5.0)
HEMATOCRIT: 39.5 % (ref 36.0–46.0)
Hemoglobin: 13 g/dL (ref 12.0–15.0)
LYMPHS PCT: 34.8 % (ref 12.0–46.0)
Lymphs Abs: 4.3 10*3/uL — ABNORMAL HIGH (ref 0.7–4.0)
MCHC: 32.9 g/dL (ref 30.0–36.0)
MCV: 84.7 fl (ref 78.0–100.0)
Monocytes Absolute: 0.5 10*3/uL (ref 0.1–1.0)
Monocytes Relative: 4.4 % (ref 3.0–12.0)
Neutro Abs: 7.2 10*3/uL (ref 1.4–7.7)
Neutrophils Relative %: 58 % (ref 43.0–77.0)
Platelets: 263 10*3/uL (ref 150.0–400.0)
RBC: 4.66 Mil/uL (ref 3.87–5.11)
RDW: 14.4 % (ref 11.5–15.5)
WBC: 12.4 10*3/uL — ABNORMAL HIGH (ref 4.0–10.5)

## 2018-12-07 LAB — LDL CHOLESTEROL, DIRECT: Direct LDL: 161 mg/dL

## 2018-12-07 LAB — COMPREHENSIVE METABOLIC PANEL
ALT: 7 U/L (ref 0–35)
AST: 11 U/L (ref 0–37)
Albumin: 4.1 g/dL (ref 3.5–5.2)
Alkaline Phosphatase: 68 U/L (ref 39–117)
BUN: 10 mg/dL (ref 6–23)
CO2: 26 mEq/L (ref 19–32)
Calcium: 9.2 mg/dL (ref 8.4–10.5)
Chloride: 103 mEq/L (ref 96–112)
Creatinine, Ser: 1.13 mg/dL (ref 0.40–1.20)
GFR: 55.2 mL/min — ABNORMAL LOW (ref >60.00)
Glucose, Bld: 73 mg/dL (ref 70–99)
Potassium: 4.4 mEq/L (ref 3.5–5.1)
Sodium: 137 mEq/L (ref 135–145)
Total Bilirubin: 0.2 mg/dL (ref 0.2–1.2)
Total Protein: 6.6 g/dL (ref 6.0–8.3)

## 2018-12-07 LAB — MAGNESIUM: MAGNESIUM: 2 mg/dL (ref 1.5–2.5)

## 2018-12-07 LAB — LIPID PANEL
Cholesterol: 226 mg/dL — ABNORMAL HIGH (ref 0–200)
HDL: 52.6 mg/dL (ref >39.00)
NonHDL: 173.06
Total CHOL/HDL Ratio: 4
Triglycerides: 229 mg/dL — ABNORMAL HIGH (ref 0.0–149.0)
VLDL: 45.8 mg/dL — ABNORMAL HIGH (ref 0.0–40.0)

## 2018-12-07 LAB — LIPASE: Lipase: 38 U/L (ref 11.0–59.0)

## 2018-12-07 NOTE — Patient Instructions (Signed)
It was great to see you!  Please go to the lab for blood work.   Our office will call you with your results unless you have chosen to receive results via MyChart.  If your blood work is normal we will follow-up each year for physicals and as scheduled for chronic medical problems.  If anything is abnormal we will treat accordingly and get you in for a follow-up.  For your diarrhea, please try to have a fiber supplement daily, such as metamucil or benefiber. Also, start a probiotic daily such as Align, Digestive Advantage. Please follow-up in 1 month if symptoms worsen or persist.  Take care,  [facility] Maintenance, Female Adopting a healthy lifestyle and getting preventive care can go a long way to promote health and wellness. Talk with your health care provider about what schedule of regular examinations is right for you. This is a good chance for you to check in with your provider about disease prevention and staying healthy. In between checkups, there are plenty of things you can do on your own. Experts have done a lot of research about which lifestyle changes and preventive measures are most likely to keep you healthy. Ask your health care provider for more information. Weight and diet Eat a healthy diet  Be sure to include plenty of vegetables, fruits, low-fat dairy products, and lean protein.  Do not eat a lot of foods high in solid fats, added sugars, or salt.  Get regular exercise. This is one of the most important things you can do for your health. ? Most adults should exercise for at least 150 minutes each week. The exercise should increase your heart rate and make you sweat (moderate-intensity exercise). ? Most adults should also do strengthening exercises at least twice a week. This is in addition to the moderate-intensity exercise. Maintain a healthy weight  Body mass index (BMI) is a measurement that can be used to identify possible weight problems. It estimates  body fat based on height and weight. Your health care provider can help determine your BMI and help you achieve or maintain a healthy weight.  For females 34 years of age and older: ? A BMI below 18.5 is considered underweight. ? A BMI of 18.5 to 24.9 is normal. ? A BMI of 25 to 29.9 is considered overweight. ? A BMI of 30 and above is considered obese. Watch levels of cholesterol and blood lipids  You should start having your blood tested for lipids and cholesterol at 33 years of age, then have this test every 5 years.  You may need to have your cholesterol levels checked more often if: ? Your lipid or cholesterol levels are high. ? You are older than 34 years of age. ? You are at high risk for heart disease. Cancer screening Lung Cancer  Lung cancer screening is recommended for adults 57-57 years old who are at high risk for lung cancer because of a history of smoking.  A yearly low-dose CT scan of the lungs is recommended for people who: ? Currently smoke. ? Have quit within the past 15 years. ? Have at least a 30-pack-year history of smoking. A pack year is smoking an average of one pack of cigarettes a day for 1 year.  Yearly screening should continue until it has been 15 years since you quit.  Yearly screening should stop if you develop a health problem that would prevent you from having lung cancer treatment. Breast Cancer  Practice breast  self-awareness. This means understanding how your breasts normally appear and feel.  It also means doing regular breast self-exams. Let your health care provider know about any changes, no matter how small.  If you are in your 20s or 30s, you should have a clinical breast exam (CBE) by a health care provider every 1-3 years as part of a regular health exam.  If you are 44 or older, have a CBE every year. Also consider having a breast X-ray (mammogram) every year.  If you have a family history of breast cancer, talk to your health care  provider about genetic screening.  If you are at high risk for breast cancer, talk to your health care provider about having an MRI and a mammogram every year.  Breast cancer gene (BRCA) assessment is recommended for women who have family members with BRCA-related cancers. BRCA-related cancers include: ? Breast. ? Ovarian. ? Tubal. ? Peritoneal cancers.  Results of the assessment will determine the need for genetic counseling and BRCA1 and BRCA2 testing. Cervical Cancer Your health care provider may recommend that you be screened regularly for cancer of the pelvic organs (ovaries, uterus, and vagina). This screening involves a pelvic examination, including checking for microscopic changes to the surface of your cervix (Pap test). You may be encouraged to have this screening done every 3 years, beginning at age 61.  For women ages 86-65, health care providers may recommend pelvic exams and Pap testing every 3 years, or they may recommend the Pap and pelvic exam, combined with testing for human papilloma virus (HPV), every 5 years. Some types of HPV increase your risk of cervical cancer. Testing for HPV may also be done on women of any age with unclear Pap test results.  Other health care providers may not recommend any screening for nonpregnant women who are considered low risk for pelvic cancer and who do not have symptoms. Ask your health care provider if a screening pelvic exam is right for you.  If you have had past treatment for cervical cancer or a condition that could lead to cancer, you need Pap tests and screening for cancer for at least 20 years after your treatment. If Pap tests have been discontinued, your risk factors (such as having a new sexual partner) need to be reassessed to determine if screening should resume. Some women have medical problems that increase the chance of getting cervical cancer. In these cases, your health care provider may recommend more frequent screening and  Pap tests. Colorectal Cancer  This type of cancer can be detected and often prevented.  Routine colorectal cancer screening usually begins at 34 years of age and continues through 34 years of age.  Your health care provider may recommend screening at an earlier age if you have risk factors for colon cancer.  Your health care provider may also recommend using home test kits to check for hidden blood in the stool.  A small camera at the end of a tube can be used to examine your colon directly (sigmoidoscopy or colonoscopy). This is done to check for the earliest forms of colorectal cancer.  Routine screening usually begins at age 35.  Direct examination of the colon should be repeated every 5-10 years through 34 years of age. However, you may need to be screened more often if early forms of precancerous polyps or small growths are found. Skin Cancer  Check your skin from head to toe regularly.  Tell your health care provider about any new moles  or changes in moles, especially if there is a change in a mole's shape or color.  Also tell your health care provider if you have a mole that is larger than the size of a pencil eraser.  Always use sunscreen. Apply sunscreen liberally and repeatedly throughout the day.  Protect yourself by wearing long sleeves, pants, a wide-brimmed hat, and sunglasses whenever you are outside. Heart disease, diabetes, and high blood pressure  High blood pressure causes heart disease and increases the risk of stroke. High blood pressure is more likely to develop in: ? People who have blood pressure in the high end of the normal range (130-139/85-89 mm Hg). ? People who are overweight or obese. ? People who are African American.  If you are 76-52 years of age, have your blood pressure checked every 3-5 years. If you are 22 years of age or older, have your blood pressure checked every year. You should have your blood pressure measured twice-once when you are at a  hospital or clinic, and once when you are not at a hospital or clinic. Record the average of the two measurements. To check your blood pressure when you are not at a hospital or clinic, you can use: ? An automated blood pressure machine at a pharmacy. ? A home blood pressure monitor.  If you are between 34 years and 54 years old, ask your health care provider if you should take aspirin to prevent strokes.  Have regular diabetes screenings. This involves taking a blood sample to check your fasting blood sugar level. ? If you are at a normal weight and have a low risk for diabetes, have this test once every three years after 34 years of age. ? If you are overweight and have a high risk for diabetes, consider being tested at a younger age or more often. Preventing infection Hepatitis B  If you have a higher risk for hepatitis B, you should be screened for this virus. You are considered at high risk for hepatitis B if: ? You were born in a country where hepatitis B is common. Ask your health care provider which countries are considered high risk. ? Your parents were born in a high-risk country, and you have not been immunized against hepatitis B (hepatitis B vaccine). ? You have HIV or AIDS. ? You use needles to inject street drugs. ? You live with someone who has hepatitis B. ? You have had sex with someone who has hepatitis B. ? You get hemodialysis treatment. ? You take certain medicines for conditions, including cancer, organ transplantation, and autoimmune conditions. Hepatitis C  Blood testing is recommended for: ? Everyone born from [birth year] through 1966. ? Anyone with known risk factors for hepatitis C. Sexually transmitted infections (STIs)  You should be screened for sexually transmitted infections (STIs) including gonorrhea and chlamydia if: ? You are sexually active and are younger than 34 years of age. ? You are older than 34 years of age and your health care provider tells you  that you are at risk for this type of infection. ? Your sexual activity has changed since you were last screened and you are at an increased risk for chlamydia or gonorrhea. Ask your health care provider if you are at risk.  If you do not have HIV, but are at risk, it may be recommended that you take a prescription medicine daily to prevent HIV infection. This is called pre-exposure prophylaxis (PrEP). You are considered at risk if: ? You are  sexually active and do not regularly use condoms or know the HIV status of your partner(s). ? You take drugs by injection. ? You are sexually active with a partner who has HIV. Talk with your health care provider about whether you are at high risk of being infected with HIV. If you choose to begin PrEP, you should first be tested for HIV. You should then be tested every 3 months for as long as you are taking PrEP. Pregnancy  If you are premenopausal and you may become pregnant, ask your health care provider about preconception counseling.  If you may become pregnant, take 400 to 800 micrograms (mcg) of folic acid every day.  If you want to prevent pregnancy, talk to your health care provider about birth control (contraception). Osteoporosis and menopause  Osteoporosis is a disease in which the bones lose minerals and strength with aging. This can result in serious bone fractures. Your risk for osteoporosis can be identified using a bone density scan.  If you are 63 years of age or older, or if you are at risk for osteoporosis and fractures, ask your health care provider if you should be screened.  Ask your health care provider whether you should take a calcium or vitamin D supplement to lower your risk for osteoporosis.  Menopause may have certain physical symptoms and risks.  Hormone replacement therapy may reduce some of these symptoms and risks. Talk to your health care provider about whether hormone replacement therapy is right for you. Follow  these instructions at home:  Schedule regular health, dental, and eye exams.  Stay current with your immunizations.  Do not use any tobacco products including cigarettes, chewing tobacco, or electronic cigarettes.  If you are pregnant, do not drink alcohol.  If you are breastfeeding, limit how much and how often you drink alcohol.  Limit alcohol intake to no more than 1 drink per day for nonpregnant women. One drink equals 12 ounces of beer, 5 ounces of wine, or 1 ounces of hard liquor.  Do not use street drugs.  Do not share needles.  Ask your health care provider for help if you need support or information about quitting drugs.  Tell your health care provider if you often feel depressed.  Tell your health care provider if you have ever been abused or do not feel safe at home. This information is not intended to replace advice given to you by your health care provider. Make sure you discuss any questions you have with your health care provider. Document Released: 04/22/2011 Document Revised: 03/14/2016 Document Reviewed: 07/11/2015 Elsevier Interactive Patient Education  2019 [employer].

## 2018-12-07 NOTE — Progress Notes (Signed)
Subjective:    Sarah Cardenas is a 33 y.o. female and is here for a comprehensive physical exam.  HPI  There are no preventive care reminders to display for this patient.  Acute Concerns: Chronic diarrhea -- she reports for the last few years she has had diarrhea. Has 2-3 episodes per day. Denies: blood in stool, abd pain, unintentional weight loss, family history of colon cancer or IBD, family hx celiacs, personal hx of lactose intolerance.  Chronic Issues: Tobacco abuse -- started at age 23, smoking 1/2 PPD. A couple of years ago, was able to quit for 6 months. Not interested in quitting at this time. Anemia -- hx of this 2/2 periods. Has been recommended to be on po iron in the past.  Health Maintenance: Immunizations -- agreeable to flu swab today Colonoscopy -- n/a Mammogram -- n/a PAP -- UTD, done 09/15/18 Diet -- eats all food groups Caffeine intake -- two cups of coffee daily Sleep habits -- denies concern for sleep apnea Exercise -- doesn't exercise Current Weight -- Weight: 254 lb (115.2 kg) -- highest weight ever Weight History: Wt Readings from Last 10 Encounters:  12/07/18 254 lb (115.2 kg)  06/07/14 205 lb 6 oz (93.2 kg)   Mood -- stable overall Patient's last menstrual period was 12/06/2018. Period characteristics -- monthly Birth control -- OCPs  Sees therapist once a month UTD with dentist and eye doctor  Depression screen [facility] 2/9 12/07/2018  Decreased Interest 1  Down, Depressed, Hopeless 1  PHQ - 2 Score 2  Altered sleeping 2  Tired, decreased energy 1  Change in appetite 1  Feeling bad or failure about yourself  1  Trouble concentrating 1  Moving slowly or fidgety/restless 0  Suicidal thoughts 0  PHQ-9 Score 8  Difficult doing work/chores Somewhat difficult     Other providers/specialists: Patient Care Team: [name], [place] as PCP - General (Physician Assistant) Karie Soda, MD as Consulting Physician (General Surgery) [name], [place] as Physician Assistant (Physician Assistant)   PMHx, SurgHx, SocialHx, Medications, and Allergies were reviewed in the Visit Navigator and updated as appropriate.   Past Medical History:  Diagnosis Date  . Anxiety   . Bipolar affect, depressed (HCC)   . Depression      Past Surgical History:  Procedure Laterality Date  . BREAST SURGERY     lump removed  . ear drum replacement     right  . FRACTURE SURGERY       Family History  Problem Relation Age of Onset  . Bipolar disorder Father   . Drug abuse Father   . Breast cancer Neg Hx   . Colon cancer Neg Hx     Social History   Tobacco Use  . Smoking status: Current Some Day Smoker    Packs/day: 0.50    Years: 10.00    Pack years: 5.00    Types: Cigarettes  . Smokeless tobacco: Never Used  Substance Use Topics  . Alcohol use: Yes    Comment: occ  . Drug use: No    Review of Systems:   Review of Systems  Constitutional: Negative for chills, fever, malaise/fatigue and weight loss.  HENT: Negative for hearing loss, sinus pain and sore throat.   Respiratory: Negative for cough and hemoptysis.   Cardiovascular: Negative for chest pain, palpitations, leg swelling and PND.  Gastrointestinal: Positive for diarrhea. Negative for abdominal pain, constipation, heartburn, nausea and vomiting.  Genitourinary: Negative for dysuria, frequency and urgency.  Musculoskeletal: Negative for back pain, myalgias and neck pain.  Skin: Negative for itching and rash.  Neurological: Negative for dizziness, tingling, seizures and headaches.  Endo/Heme/Allergies: Negative for polydipsia.  Psychiatric/Behavioral: Negative for depression. The patient is not nervous/anxious.     Objective:   BP 110/80 (BP Location: Left Arm, Patient Position: Sitting, Cuff Size: Large)   Pulse 72   Temp 97.6 F (36.4 C) (Oral)   Ht 5\' 4"  (1.626 m)   Wt 254 lb (115.2 kg)   LMP 12/06/2018   SpO2 96%   BMI 43.60 kg/m   General  Appearance:    Alert, cooperative, no distress, appears stated age  Head:    Normocephalic, without obvious abnormality, atraumatic  Eyes:    PERRL, conjunctiva/corneas clear, EOM's intact, fundi    benign, both eyes  Ears:    Normal TM's and external ear canals, both ears  Nose:   Nares normal, septum midline, mucosa normal, no drainage    or sinus tenderness  Throat:   Lips, mucosa, and tongue normal; teeth and gums normal  Neck:   Supple, symmetrical, trachea midline, no adenopathy;    thyroid:  no enlargement/tenderness/nodules; no carotid   bruit or JVD  Back:     Symmetric, no curvature, ROM normal, no CVA tenderness  Lungs:     Clear to auscultation bilaterally, respirations unlabored  Chest Wall:    No tenderness or deformity   Heart:    Regular rate and rhythm, S1 and S2 normal, no murmur, rub   or gallop  Breast Exam:    Deferred  Abdomen:     Soft, non-tender, bowel sounds active all four quadrants,    no masses, no organomegaly  Genitalia:    Deferred  Rectal:    Deferred  Extremities:   Extremities normal, atraumatic, no cyanosis or edema  Pulses:   2+ and symmetric all extremities  Skin:   Skin color, texture, turgor normal, no rashes or lesions  Lymph nodes:   Cervical, supraclavicular, and axillary nodes normal  Neurologic:   CNII-XII intact, normal strength, sensation and reflexes    throughout    Assessment/Plan:   Denaly was seen today for establish care.  Diagnoses and all orders for this visit:  Encounter for general adult medical examination with abnormal findings Today patient counseled on age appropriate routine health concerns for screening and prevention, each reviewed and up to date or declined. Immunizations reviewed and up to date or declined. Labs ordered and reviewed. Risk factors for depression reviewed and negative. Hearing function and visual acuity are intact. ADLs screened and addressed as needed. Functional ability and level of safety reviewed and  appropriate. Education, counseling and referrals performed based on assessed risks today. Patient provided with a copy of personalized plan for preventive services.  Chronic diarrhea No red flags on exam. Unsure etiology, may be related to one of her long term psych meds vs dietary intolerance?  Will have her trial fiber supplements, work on cleaning up diet and starting probiotic. Labs today. Will have patient follow-up if symptoms persist or worsen. -     Lipase -     Magnesium -     Vitamin B12 -     TSH  Encounter for lipid screening for cardiovascular disease -     Lipid panel  Anemia, unspecified type No symptoms currently. Will screen with routine labs. -     CBC with Differential/Platelet -     Comprehensive metabolic panel -  Vitamin B12  Need for prophylactic vaccination and inoculation against influenza -     Flu Vaccine QUAD 36+ mos IM  Obesity, unspecified classification, unspecified obesity type, unspecified whether serious comorbidity present Encouraged diet and exercise as able.   Well Adult Exam: Labs ordered: Yes. Patient counseling was done. See below for items discussed. Discussed the patient's BMI. The BMI is not in the acceptable range; BMI management plan is completed Follow up in 1 month.  Patient Counseling:   [x]     Nutrition: Stressed importance of moderation in sodium/caffeine intake, saturated fat and cholesterol, caloric balance, sufficient intake of fresh fruits, vegetables, fiber, calcium, iron, and 1 mg of folate supplement per day (for females capable of pregnancy).   [x]      Stressed the importance of regular exercise.    [x]     Substance Abuse: Discussed cessation/primary prevention of tobacco, alcohol, or other drug use; driving or other dangerous activities under the influence; availability of treatment for abuse.    [x]      Injury prevention: Discussed safety belts, safety helmets, smoke detector, smoking near bedding or upholstery.      [x]      Sexuality: Discussed sexually transmitted diseases, partner selection, use of condoms, avoidance of unintended pregnancy  and contraceptive alternatives.    [x]     Dental health: Discussed importance of regular tooth brushing, flossing, and dental visits.   [x]      Health maintenance and immunizations reviewed. Please refer to Health maintenance section.   CMA or LPN served as scribe during this visit. History, Physical, and Plan performed by medical provider. The above documentation has been reviewed and is accurate and complete.  Jarold MottoSamantha Kennady Zimmerle, PA-C [place] [facility]

## 2018-12-08 LAB — TSH: TSH: 2.24 u[IU]/mL (ref 0.35–4.50)

## 2018-12-08 LAB — VITAMIN B12: Vitamin B-12: 163 pg/mL — ABNORMAL LOW (ref 211–911)

## 2018-12-09 ENCOUNTER — Telehealth: Payer: Self-pay | Admitting: *Deleted

## 2018-12-09 NOTE — Telephone Encounter (Signed)
Called pt and left vm to schedule b12

## 2018-12-09 NOTE — Telephone Encounter (Signed)
Copied from CRM [id number]. Topic: General - Other >> Dec 09, 2018  9:46 AM Gaynelle Adu wrote: Reason for CRM:  Patient is requesting a B12 shot. Please advise >> Dec 09, 2018  9:49 AM Pauline Aus B wrote: Please advise if it is time for the B12 shot

## 2018-12-09 NOTE — Telephone Encounter (Signed)
Please call pt and schedule Nurse visit for Vit B12 Injection this week or next.

## 2018-12-10 ENCOUNTER — Ambulatory Visit (INDEPENDENT_AMBULATORY_CARE_PROVIDER_SITE_OTHER): Payer: No Typology Code available for payment source | Admitting: *Deleted

## 2018-12-10 DIAGNOSIS — E538 Deficiency of other specified B group vitamins: Secondary | ICD-10-CM

## 2018-12-10 MED ORDER — CYANOCOBALAMIN 1000 MCG/ML IJ SOLN
1000.0000 ug | Freq: Once | INTRAMUSCULAR | Status: AC
  Administered 2018-12-10: 1000 ug via INTRAMUSCULAR

## 2018-12-10 NOTE — Progress Notes (Signed)
Per orders of Dr. Durene Cal, injection of Cyanocobalamin 1000 mcg/ml given in rt deltoid by Raechel Chute Advith Martine, CMA Patient tolerated injection well.  Pt told to return in 1 week for next injection.  Pt verbalized understanding.

## 2018-12-16 ENCOUNTER — Ambulatory Visit (INDEPENDENT_AMBULATORY_CARE_PROVIDER_SITE_OTHER): Payer: No Typology Code available for payment source

## 2018-12-16 DIAGNOSIS — E538 Deficiency of other specified B group vitamins: Secondary | ICD-10-CM

## 2018-12-16 MED ORDER — CYANOCOBALAMIN 1000 MCG/ML IJ SOLN
1000.0000 ug | Freq: Once | INTRAMUSCULAR | Status: AC
  Administered 2018-12-16: 1000 ug via INTRAMUSCULAR

## 2018-12-16 NOTE — Patient Instructions (Signed)
There are no preventive care reminders to display for this patient.  Depression screen PHQ 2/9 12/07/2018  Decreased Interest 1  Down, Depressed, Hopeless 1  PHQ - 2 Score 2  Altered sleeping 2  Tired, decreased energy 1  Change in appetite 1  Feeling bad or failure about yourself  1  Trouble concentrating 1  Moving slowly or fidgety/restless 0  Suicidal thoughts 0  PHQ-9 Score 8  Difficult doing work/chores Somewhat difficult

## 2018-12-16 NOTE — Progress Notes (Signed)
Per orders of [name] , injection of B12 given in Left deltoid by Sherrin Daisy. Patient tolerated injection well.

## 2018-12-23 ENCOUNTER — Ambulatory Visit (INDEPENDENT_AMBULATORY_CARE_PROVIDER_SITE_OTHER): Payer: No Typology Code available for payment source

## 2018-12-23 DIAGNOSIS — E538 Deficiency of other specified B group vitamins: Secondary | ICD-10-CM | POA: Diagnosis not present

## 2018-12-23 MED ORDER — CYANOCOBALAMIN 1000 MCG/ML IJ SOLN
1000.0000 ug | Freq: Once | INTRAMUSCULAR | Status: AC
  Administered 2018-12-23: 1000 ug via INTRAMUSCULAR

## 2018-12-23 NOTE — Progress Notes (Signed)
Per orders of [name], [place], injection of vitamin B12 1000 mcg given in right deltoid by [employer], CMA.  Patient tolerated injection well.  Patient will schedule next injection in 1 week.

## 2018-12-30 ENCOUNTER — Other Ambulatory Visit: Payer: Self-pay

## 2018-12-30 ENCOUNTER — Ambulatory Visit (INDEPENDENT_AMBULATORY_CARE_PROVIDER_SITE_OTHER): Payer: No Typology Code available for payment source

## 2018-12-30 DIAGNOSIS — E538 Deficiency of other specified B group vitamins: Secondary | ICD-10-CM

## 2018-12-30 MED ORDER — CYANOCOBALAMIN 1000 MCG/ML IJ SOLN
1000.0000 ug | Freq: Once | INTRAMUSCULAR | Status: AC
  Administered 2018-12-30: 1000 ug via INTRAMUSCULAR

## 2018-12-30 NOTE — Progress Notes (Signed)
Per orders of [name] PA ,injection of Vitamin B12 given by Erick Alley. Given in left deltoid,Patient tolerated injection well.

## 2018-12-31 ENCOUNTER — Other Ambulatory Visit: Payer: Self-pay | Admitting: Physician Assistant

## 2018-12-31 ENCOUNTER — Other Ambulatory Visit (INDEPENDENT_AMBULATORY_CARE_PROVIDER_SITE_OTHER): Payer: No Typology Code available for payment source

## 2018-12-31 DIAGNOSIS — R944 Abnormal results of kidney function studies: Secondary | ICD-10-CM

## 2019-01-01 LAB — BASIC METABOLIC PANEL
BUN: 11 mg/dL (ref 6–23)
CHLORIDE: 105 meq/L (ref 96–112)
CO2: 25 mEq/L (ref 19–32)
Calcium: 9 mg/dL (ref 8.4–10.5)
Creatinine, Ser: 1.18 mg/dL (ref 0.40–1.20)
GFR: 52.49 mL/min — AB (ref >60.00)
Glucose, Bld: 99 mg/dL (ref 70–99)
Potassium: 4.3 mEq/L (ref 3.5–5.1)
Sodium: 138 mEq/L (ref 135–145)

## 2019-01-06 ENCOUNTER — Telehealth: Payer: No Typology Code available for payment source | Admitting: Family

## 2019-01-06 DIAGNOSIS — R05 Cough: Secondary | ICD-10-CM

## 2019-01-06 DIAGNOSIS — R059 Cough, unspecified: Secondary | ICD-10-CM

## 2019-01-06 MED ORDER — DOXYCYCLINE HYCLATE 100 MG PO TABS: 100.0000 mg | ORAL_TABLET | Freq: Two times a day (BID) | ORAL | 0 refills | Status: DC

## 2019-01-06 MED ORDER — PREDNISONE 10 MG (21) PO TBPK: ORAL_TABLET | ORAL | 0 refills | Status: DC

## 2019-01-06 MED ORDER — BENZONATATE 100 MG PO CAPS: 100.0000 mg | ORAL_CAPSULE | Freq: Three times a day (TID) | ORAL | 0 refills | Status: DC | PRN

## 2019-01-06 NOTE — Progress Notes (Signed)
We are sorry that you are not feeling well.  Here is how we plan to help!  Based on your presentation I believe you most likely have A cough due to bacteria.  When patients have a fever and a productive cough with a change in color or increased sputum production, we are concerned about bacterial bronchitis.  If left untreated it can progress to pneumonia.  If your symptoms do not improve with your treatment plan it is important that you contact your provider.   I have prescribed Doxycycline 100 mg twice a day for 7 days     In addition you may use A non-prescription cough medication called Robitussin DAC. Take 2 teaspoons every 8 hours or Delsym: take 2 teaspoons every 12 hours., A non-prescription cough medication called Mucinex DM: take 2 tablets every 12 hours. and A prescription cough medication called Tessalon Perles 100mg . You may take 1-2 capsules every 8 hours as needed for your cough.   Approximately 5 minutes was spent documenting and reviewing patient's chart.    Prednisone 10 mg daily for 6 days (see taper instructions below)  Directions for 6 day taper: Day 1: 2 tablets before breakfast, 1 after both lunch & dinner and 2 at bedtime Day 2: 1 tab before breakfast, 1 after both lunch & dinner and 2 at bedtime Day 3: 1 tab at each meal & 1 at bedtime Day 4: 1 tab at breakfast, 1 at lunch, 1 at bedtime Day 5: 1 tab at breakfast & 1 tab at bedtime Day 6: 1 tab at breakfast   From your responses in the eVisit questionnaire you describe inflammation in the upper respiratory tract which is causing a significant cough.  This is commonly called Bronchitis and has four common causes:    Allergies  Viral Infections  Acid Reflux  Bacterial Infection Allergies, viruses and acid reflux are treated by controlling symptoms or eliminating the cause. An example might be a cough caused by taking certain blood pressure medications. You stop the cough by changing the medication. Another example  might be a cough caused by acid reflux. Controlling the reflux helps control the cough.  USE OF BRONCHODILATOR ("RESCUE") INHALERS: There is a risk from using your bronchodilator too frequently.  The risk is that over-reliance on a medication which only relaxes the muscles surrounding the breathing tubes can reduce the effectiveness of medications prescribed to reduce swelling and congestion of the tubes themselves.  Although you feel brief relief from the bronchodilator inhaler, your asthma may actually be worsening with the tubes becoming more swollen and filled with mucus.  This can delay other crucial treatments, such as oral steroid medications. If you need to use a bronchodilator inhaler daily, several times per day, you should discuss this with your provider.  There are probably better treatments that could be used to keep your asthma under control.     HOME CARE . Only take medications as instructed by your medical team. . Complete the entire course of an antibiotic. . Drink plenty of fluids and get plenty of rest. . Avoid close contacts especially the very young and the elderly . Cover your mouth if you cough or cough into your sleeve. . Always remember to wash your hands . A steam or ultrasonic humidifier can help congestion.   GET HELP RIGHT AWAY IF: . You develop worsening fever. . You become short of breath . You cough up blood. . Your symptoms persist after you have completed your treatment plan  MAKE SURE YOU   Understand these instructions.  Will watch your condition.  Will get help right away if you are not doing well or get worse.  Your e-visit answers were reviewed by a board certified advanced clinical practitioner to complete your personal care plan.  Depending on the condition, your plan could have included both over the counter or prescription medications. If there is a problem please reply  once you have received a response from your provider. Your safety is  important to [place].  If you have drug allergies check your prescription carefully.    You can use MyChart to ask questions about today's visit, request a non-urgent call back, or ask for a work or school excuse for 24 hours related to this e-Visit. If it has been greater than 24 hours you will need to follow up with your provider, or enter a new e-Visit to address those concerns. You will get an e-mail in the next two days asking about your experience.  I hope that your e-visit has been valuable and will speed your recovery. Thank you for using e-visits.

## 2019-01-07 ENCOUNTER — Encounter: Payer: Self-pay | Admitting: Family

## 2019-01-25 ENCOUNTER — Telehealth: Payer: Self-pay

## 2019-01-25 NOTE — Telephone Encounter (Signed)
Left detailed voicemail message on patient's cell phone requesting a call back.  Need to schedule web visit to f/u for chronic diarrhea.

## 2019-01-27 ENCOUNTER — Telehealth: Payer: Self-pay | Admitting: *Deleted

## 2019-01-27 NOTE — Telephone Encounter (Signed)
Left detailed message on personal voicemail need to cancel Nurse visit scheduled for April 15th for B12 injection. We are currently not doing nurse visit in the office. Please take OTC Vit B12 1000 mcg one tablet daily for the next month, call back in one month to reschedule B12 injection. Any questions please call.

## 2019-02-03 ENCOUNTER — Ambulatory Visit: Payer: No Typology Code available for payment source

## 2019-02-04 ENCOUNTER — Ambulatory Visit (INDEPENDENT_AMBULATORY_CARE_PROVIDER_SITE_OTHER): Payer: No Typology Code available for payment source | Admitting: Physician Assistant

## 2019-02-04 ENCOUNTER — Encounter: Payer: Self-pay | Admitting: Physician Assistant

## 2019-02-04 VITALS — BP 122/70 | HR 71 | Temp 98.0°F

## 2019-02-04 DIAGNOSIS — R51 Headache: Secondary | ICD-10-CM | POA: Diagnosis not present

## 2019-02-04 DIAGNOSIS — R519 Headache, unspecified: Secondary | ICD-10-CM

## 2019-02-04 MED ORDER — KETOROLAC TROMETHAMINE 60 MG/2ML IM SOLN
60.0000 mg | Freq: Once | INTRAMUSCULAR | Status: AC
  Administered 2019-02-04: 60 mg via INTRAMUSCULAR

## 2019-02-04 NOTE — Patient Instructions (Signed)
1. You received toradol today, do not take any more anti-inflammatories today 2. If still having symptoms tomorrow, try Excedrin Migraine 3.  Limit use of pain relievers to no more than 2 days out of the week.  These medications include acetaminophen, ibuprofen, triptans and narcotics.  This will help reduce risk of rebound headaches. 4.  Be aware of common food triggers such as processed sweets, processed foods with nitrites (such as deli meat, hot dogs, sausages), foods with MSG, alcohol (such as wine), chocolate, certain cheeses, certain fruits (dried fruits, bananas, pineapple), vinegar, diet soda. 4.  Avoid caffeine 5.  Routine exercise 6.  Proper sleep hygiene 7.  Stay adequately hydrated with water 8.  Keep a headache diary. 9.  Maintain proper stress management. 10.  Do not skip meals.   General Headache Without Cause A headache is pain or discomfort that is felt around the head or neck area. There are many causes and types of headaches. In some cases, the cause may not be found. Follow these instructions at home: Watch your condition for any changes. Let your doctor know about them. Take these steps to help with your condition: Managing pain      Take over-the-counter and prescription medicines only as told by your doctor.  Lie down in a dark, quiet room when you have a headache.  If told, put ice on your head and neck area: ? Put ice in a plastic bag. ? Place a towel between your skin and the bag. ? Leave the ice on for 20 minutes, 2-3 times per day.  If told, put heat on the affected area. Use the heat source that your doctor recommends, such as a moist heat pack or a heating pad. ? Place a towel between your skin and the heat source. ? Leave the heat on for 20-30 minutes. ? Remove the heat if your skin turns bright red. This is very important if you are unable to feel pain, heat, or cold. You may have a greater risk of getting burned.  Keep lights dim if bright  lights bother you or make your headaches worse. Eating and drinking  Eat meals on a regular schedule.  If you drink alcohol: ? Limit how much you use to:  0-1 drink a day for women.  0-2 drinks a day for men. ? Be aware of how much alcohol is in your drink. In the [place] one drink equals one 12 oz bottle of beer (355 mL), one 5 oz glass of wine (148 mL), or one 1 oz glass of hard liquor (44 mL).  Stop drinking caffeine, or reduce how much caffeine you drink. General instructions   Keep a journal to find out if certain things bring on headaches. For example, write down: ? What you eat and drink. ? How much sleep you get. ? Any change to your diet or medicines.  Get a massage or try other ways to relax.  Limit stress.  Sit up straight. Do not tighten (tense) your muscles.  Do not use any products that contain nicotine or tobacco. This includes cigarettes, e-cigarettes, and chewing tobacco. If you need help quitting, ask your doctor.  Exercise regularly as told by your doctor.  Get enough sleep. This often means 7-9 hours of sleep each night.  Keep all follow-up visits as told by your doctor. This is important. Contact a doctor if:  Your symptoms are not helped by medicine.  You have a headache that feels different than the other  headaches.  You feel sick to your stomach (nauseous) or you throw up (vomit).  You have a fever. Get help right away if:  Your headache gets very bad quickly.  Your headache gets worse after a lot of physical activity.  You keep throwing up.  You have a stiff neck.  You have trouble seeing.  You have trouble speaking.  You have pain in the eye or ear.  Your muscles are weak or you lose muscle control.  You lose your balance or have trouble walking.  You feel like you will pass out (faint) or you pass out.  You are mixed up (confused).  You have a seizure. Summary  A headache is pain or discomfort that is felt around the  head or neck area.  There are many causes and types of headaches. In some cases, the cause may not be found.  Keep a journal to help find out what causes your headaches. Watch your condition for any changes. Let your doctor know about them.  Contact a doctor if you have a headache that is different from usual, or if your headache is not helped by medicine.  Get help right away if your headache gets very bad, you throw up, you have trouble seeing, you lose your balance, or you have a seizure. This information is not intended to replace advice given to you by your health care provider. Make sure you discuss any questions you have with your health care provider. Document Released: 07/16/2008 Document Revised: 04/27/2018 Document Reviewed: 04/27/2018 Elsevier Interactive Patient Education  2019 [employer].

## 2019-02-04 NOTE — Progress Notes (Signed)
Sarah Cardenas is a 33 y.o. female here for a new problem.  History of Present Illness:   Chief Complaint  Patient presents with  . Headache    HPI   Headache Pt c/o headache started Tuesday night, located frontal area behind eyes. Pt is taking Motrin 400 mg q 6 hours with no relief. Pt having slight blurred vision, some nasal congestion and scratchy throat.  Denies fever, cough, chills, one-sided weakness, chest pain, SOB, confusion, slurred speech, changes in gait.  Does have history of HA. She is currently on her period. Denies thunder-clap onset.   Past Medical History:  Diagnosis Date  . Anxiety   . Bipolar affect, depressed (HCC)   . Depression      Social History   Socioeconomic History  . Marital status: Single    Spouse name: Not on file  . Number of children: Not on file  . Years of education: Not on file  . Highest education level: Not on file  Occupational History  . Not on file  Social Needs  . Financial resource strain: Not on file  . Food insecurity:    Worry: Not on file    Inability: Not on file  . Transportation needs:    Medical: Not on file    Non-medical: Not on file  Tobacco Use  . Smoking status: Current Some Day Smoker    Packs/day: 0.50    Years: 10.00    Pack years: 5.00    Types: Cigarettes  . Smokeless tobacco: Never Used  Substance and Sexual Activity  . Alcohol use: Yes    Comment: occ  . Drug use: No  . Sexual activity: Not Currently    Birth control/protection: Pill  Lifestyle  . Physical activity:    Days per week: Not on file    Minutes per session: Not on file  . Stress: Not on file  Relationships  . Social connections:    Talks on phone: Not on file    Gets together: Not on file    Attends religious service: Not on file    Active member of club or organization: Not on file    Attends meetings of clubs or organizations: Not on file    Relationship status: Not on file  . Intimate partner violence:    Fear of  current or ex partner: Not on file    Emotionally abused: Not on file    Physically abused: Not on file    Forced sexual activity: Not on file  Other Topics Concern  . Not on file  Social History Narrative   HR x 6 years   Single, no children    Past Surgical History:  Procedure Laterality Date  . BREAST SURGERY     lump removed  . ear drum replacement     right  . FRACTURE SURGERY      Family History  Problem Relation Age of Onset  . Bipolar disorder Father   . Drug abuse Father   . Breast cancer Neg Hx   . Colon cancer Neg Hx     Allergies  Allergen Reactions  . Ciprofloxacin Other (See Comments)    numbness    Current Medications:   Current Outpatient Medications:  .  hydrOXYzine (ATARAX/VISTARIL) 25 MG tablet, Take 25 mg by mouth 2 (two) times daily at 8 am and 10 pm. , Disp: , Rfl:  .  ibuprofen (ADVIL,MOTRIN) 200 MG tablet, Take 400 mg by mouth every 6 (six)  hours as needed., Disp: , Rfl:  .  lamoTRIgine (LAMICTAL) 150 MG tablet, Take 150 mg by mouth 2 (two) times daily., Disp: , Rfl:  .  lurasidone (LATUDA) 80 MG TABS tablet, Take 80 mg by mouth daily with breakfast., Disp: , Rfl:  .  norgestimate-ethinyl estradiol (ESTARYLLA) 0.25-35 MG-MCG tablet, Estarylla 0.25 mg-35 mcg tablet, Disp: , Rfl:  .  prazosin (MINIPRESS) 2 MG capsule, TK 2 CS PO QHS, Disp: , Rfl:  .  sertraline (ZOLOFT) 100 MG tablet, TK 1 T PO QAM, Disp: , Rfl:  .  VENTOLIN HFA 108 (90 Base) MCG/ACT inhaler, INL 2 PFS ITL Q 4 H FOR UP TO 10 DAYS PRF WHZ OR SOB, Disp: , Rfl:  .  albuterol (PROVENTIL HFA;VENTOLIN HFA) 108 (90 Base) MCG/ACT inhaler, Inhale into the lungs., Disp: , Rfl:    Review of Systems:   Review of Systems  Constitutional: Negative for chills, fever, malaise/fatigue and weight loss.  Respiratory: Negative for shortness of breath.   Cardiovascular: Negative for chest pain, orthopnea, claudication and leg swelling.  Gastrointestinal: Negative for heartburn, nausea and  vomiting.  Musculoskeletal: Negative for back pain, joint pain and neck pain.  Neurological: Negative for dizziness, tingling and headaches.    Vitals:   Vitals:   02/04/19 1147  BP: 122/70  Pulse: 71  Temp: 98 F (36.7 C)     There is no height or weight on file to calculate BMI.  Physical Exam:   Physical Exam Vitals signs and nursing note reviewed.  Constitutional:      General: She is not in acute distress.    Appearance: She is well-developed. She is not ill-appearing or toxic-appearing.  Cardiovascular:     Rate and Rhythm: Normal rate and regular rhythm.     Pulses: Normal pulses.     Heart sounds: Normal heart sounds, S1 normal and S2 normal.     Comments: No LE edema Pulmonary:     Effort: Pulmonary effort is normal.     Breath sounds: Normal breath sounds.  Skin:    General: Skin is warm and dry.  Neurological:     General: No focal deficit present.     Mental Status: She is alert.     GCS: GCS eye subscore is 4. GCS verbal subscore is 5. GCS motor subscore is 6.     Cranial Nerves: Cranial nerves are intact.     Sensory: Sensation is intact.     Motor: Motor function is intact.     Coordination: Coordination is intact.     Deep Tendon Reflexes:     Reflex Scores:      Patellar reflexes are 2+ on the right side and 2+ on the left side. Psychiatric:        Speech: Speech normal.        Behavior: Behavior normal. Behavior is cooperative.     Assessment and Plan:   Sarah Cardenas was seen today for headache.  Diagnoses and all orders for this visit:  Acute nonintractable headache, unspecified headache type -     ketorolac (TORADOL) injection 60 mg   Vitals stable. Did discuss that I cannot rule out something more significant (such as stroke) without CT scan. She declined and states that she accepts the risks of this -- doesn't think that she is having a stroke.  Worsening precautions advised. She was given toradol today. Advised to not take any more  anti-inflammatories today. May try excedrin migraine if needed. Will call tomorrow  to check in with her.  . Reviewed expectations re: course of current medical issues. . Discussed self-management of symptoms. . Outlined signs and symptoms indicating need for more acute intervention. . Patient verbalized understanding and all questions were answered. . See orders for this visit as documented in the electronic medical record. . Patient received an After-Visit Summary.  [name], PA-C

## 2019-02-05 ENCOUNTER — Telehealth: Payer: Self-pay | Admitting: *Deleted

## 2019-02-05 NOTE — Telephone Encounter (Signed)
Left message on voicemail to call office. Calling to check on pt.  

## 2019-02-18 ENCOUNTER — Encounter: Payer: Self-pay | Admitting: Physician Assistant

## 2019-02-18 ENCOUNTER — Ambulatory Visit (INDEPENDENT_AMBULATORY_CARE_PROVIDER_SITE_OTHER): Payer: No Typology Code available for payment source | Admitting: Physician Assistant

## 2019-02-18 DIAGNOSIS — G43009 Migraine without aura, not intractable, without status migrainosus: Secondary | ICD-10-CM

## 2019-02-18 MED ORDER — SUMATRIPTAN SUCCINATE 25 MG PO TABS: 25.0000 mg | ORAL_TABLET | ORAL | 0 refills | Status: DC | PRN

## 2019-02-18 MED ORDER — PROMETHAZINE HCL 12.5 MG PO TABS: 12.5000 mg | ORAL_TABLET | Freq: Three times a day (TID) | ORAL | 0 refills | Status: DC | PRN

## 2019-02-18 MED ORDER — KETOROLAC TROMETHAMINE 10 MG PO TABS: 10.0000 mg | ORAL_TABLET | Freq: Four times a day (QID) | ORAL | 0 refills | Status: DC | PRN

## 2019-02-18 NOTE — Progress Notes (Signed)
Virtual Visit via Video   I connected with Sarah Cardenas on 02/18/19 at  9:00 AM EDT by a video enabled telemedicine application and verified that I am speaking with the correct person using two identifiers. Location patient: Home Location provider: [facility], Office Persons participating in the virtual visit: Madeleyn Annina Ropp, [name], PA-C.  I discussed the limitations of evaluation and management by telemedicine and the availability of in person appointments. The patient expressed understanding and agreed to proceed.  Subjective:   HPI:  Migraine Pt c/o constant frontal headache started yesterday, rating headache 6/10 this morning. Pt states eyes are sensitive to light. Denies ringing in ears, nause or dizziness. Pt taking Ibuprofen 600 mg every 6 hours with little relief. Pt stayed home from work yesterday and today due to Migraine.  Two weeks ago she was in our office for similar symptoms and received toradol injection and tolerated well.  Denies auras or thunderclap onset.  ROS: See pertinent positives and negatives per HPI.  Patient Active Problem List   Diagnosis Date Noted  . Cervical intraepithelial neoplasia grade 1 12/07/2018    Social History   Tobacco Use  . Smoking status: Current Some Day Smoker    Packs/day: 0.50    Years: 10.00    Pack years: 5.00    Types: Cigarettes  . Smokeless tobacco: Never Used  Substance Use Topics  . Alcohol use: Yes    Comment: occ    Current Outpatient Medications:  .  hydrOXYzine (ATARAX/VISTARIL) 25 MG tablet, Take 25 mg by mouth 2 (two) times daily at 8 am and 10 pm. , Disp: , Rfl:  .  ibuprofen (ADVIL,MOTRIN) 200 MG tablet, Take 400 mg by mouth every 6 (six) hours as needed., Disp: , Rfl:  .  lamoTRIgine (LAMICTAL) 150 MG tablet, Take 150 mg by mouth 2 (two) times daily., Disp: , Rfl:  .  lurasidone (LATUDA) 80 MG TABS tablet, Take 80 mg by mouth daily with breakfast., Disp: , Rfl:  .   norgestimate-ethinyl estradiol (ESTARYLLA) 0.25-35 MG-MCG tablet, Estarylla 0.25 mg-35 mcg tablet, Disp: , Rfl:  .  prazosin (MINIPRESS) 2 MG capsule, TK 2 CS PO QHS, Disp: , Rfl:  .  sertraline (ZOLOFT) 100 MG tablet, TK 1 T PO QAM, Disp: , Rfl:  .  albuterol (PROVENTIL HFA;VENTOLIN HFA) 108 (90 Base) MCG/ACT inhaler, Inhale into the lungs., Disp: , Rfl:  .  ketorolac (TORADOL) 10 MG tablet, Take 1 tablet (10 mg total) by mouth every 6 (six) hours as needed., Disp: 20 tablet, Rfl: 0 .  promethazine (PHENERGAN) 12.5 MG tablet, Take 1 tablet (12.5 mg total) by mouth every 8 (eight) hours as needed for nausea or vomiting., Disp: 20 tablet, Rfl: 0 .  SUMAtriptan (IMITREX) 25 MG tablet, Take 1 tablet (25 mg total) by mouth every 2 (two) hours as needed for migraine. May repeat in 2 hours if headache persists or recurs., Disp: 10 tablet, Rfl: 0 .  VENTOLIN HFA 108 (90 Base) MCG/ACT inhaler, INL 2 PFS ITL Q 4 H FOR UP TO 10 DAYS PRF WHZ OR SOB, Disp: , Rfl:   Allergies  Allergen Reactions  . Ciprofloxacin Other (See Comments)    numbness    Objective:   VITALS: Per patient if applicable, see vitals. GENERAL: Alert, appears well and in no acute distress. HEENT: Atraumatic, conjunctiva clear, no obvious abnormalities on inspection of external nose and ears. NECK: Normal movements of the head and neck. CARDIOPULMONARY: No increased  WOB. Speaking in clear sentences. I:E ratio WNL.  MS: Moves all visible extremities without noticeable abnormality. PSYCH: Pleasant and cooperative, well-groomed. Speech normal rate and rhythm. Affect is appropriate. Insight and judgement are appropriate. Attention is focused, linear, and appropriate.  NEURO: CN grossly intact. Oriented as arrived to appointment on time with no prompting. Moves both UE equally.  SKIN: No obvious lesions, wounds, erythema, or cyanosis noted on face or hands.  Assessment and Plan:   Sarah Cardenas was seen today for migraine.  Diagnoses and all  orders for this visit:  Migraine without aura and without status migrainosus, not intractable No red flags on discussion. Worsening precautions advised. I asked her to follow-up if symptoms do not improve.  Patient was advised as follows:  Here is the plan. For today, you may take 1 of the toradol tablet, 1 phenergan tablet, and a benadryl.   For future migraines, you may take imitrex. I have sent this in. This is to be taken at onset of migraines and you may take another tablet two hours later (do not exceed this dosage!) There is a risk of this medication interacting with your zoloft, but it is low. The interaction would cause something called serotonin syndrome, which is rare, but could include sweating, tremors, feeling really hot, dilated pupils, and rigid muscles. If you develop this, tell someone and go to the ER. Like I stated above, low risk of it occuring, but something to put on your radar.     Other orders -     ketorolac (TORADOL) 10 MG tablet; Take 1 tablet (10 mg total) by mouth every 6 (six) hours as needed. -     promethazine (PHENERGAN) 12.5 MG tablet; Take 1 tablet (12.5 mg total) by mouth every 8 (eight) hours as needed for nausea or vomiting. -     SUMAtriptan (IMITREX) 25 MG tablet; Take 1 tablet (25 mg total) by mouth every 2 (two) hours as needed for migraine. May repeat in 2 hours if headache persists or recurs.    . Reviewed expectations re: course of current medical issues. . Discussed self-management of symptoms. . Outlined signs and symptoms indicating need for more acute intervention. . Patient verbalized understanding and all questions were answered. Marland Kitchen. Health Maintenance issues including appropriate healthy diet, exercise, and smoking avoidance were discussed with patient. . See orders for this visit as documented in the electronic medical record.  I discussed the assessment and treatment plan with the patient. The patient was provided an opportunity to ask  questions and all were answered. The patient agreed with the plan and demonstrated an understanding of the instructions.   The patient was advised to call back or seek an in-person evaluation if the symptoms worsen or if the condition fails to improve as anticipated.    OnancockSamantha Mehtaab Mayeda, [place]PA 02/18/2019

## 2019-02-22 ENCOUNTER — Encounter: Payer: Self-pay | Admitting: Physician Assistant

## 2019-02-22 ENCOUNTER — Telehealth: Payer: No Typology Code available for payment source | Admitting: Physician Assistant

## 2019-02-22 DIAGNOSIS — M5440 Lumbago with sciatica, unspecified side: Secondary | ICD-10-CM | POA: Diagnosis not present

## 2019-02-22 MED ORDER — TIZANIDINE HCL 2 MG PO TABS: 2.0000 mg | ORAL_TABLET | Freq: Three times a day (TID) | ORAL | 0 refills | Status: DC | PRN

## 2019-02-22 MED ORDER — NAPROXEN 500 MG PO TABS: 500.0000 mg | ORAL_TABLET | Freq: Two times a day (BID) | ORAL | 0 refills | Status: DC

## 2019-02-22 NOTE — Progress Notes (Signed)

## 2019-03-04 ENCOUNTER — Ambulatory Visit: Payer: No Typology Code available for payment source | Admitting: Family Medicine

## 2019-03-04 ENCOUNTER — Ambulatory Visit: Payer: No Typology Code available for payment source | Admitting: Physician Assistant

## 2019-04-14 ENCOUNTER — Telehealth: Payer: Self-pay | Admitting: *Deleted

## 2019-04-14 ENCOUNTER — Ambulatory Visit (INDEPENDENT_AMBULATORY_CARE_PROVIDER_SITE_OTHER): Payer: No Typology Code available for payment source | Admitting: Family Medicine

## 2019-04-14 ENCOUNTER — Other Ambulatory Visit: Payer: Self-pay

## 2019-04-14 DIAGNOSIS — R059 Cough, unspecified: Secondary | ICD-10-CM

## 2019-04-14 DIAGNOSIS — J3489 Other specified disorders of nose and nasal sinuses: Secondary | ICD-10-CM

## 2019-04-14 DIAGNOSIS — Z20822 Contact with and (suspected) exposure to covid-19: Secondary | ICD-10-CM

## 2019-04-14 DIAGNOSIS — R05 Cough: Secondary | ICD-10-CM | POA: Diagnosis not present

## 2019-04-14 DIAGNOSIS — Z20828 Contact with and (suspected) exposure to other viral communicable diseases: Secondary | ICD-10-CM

## 2019-04-14 DIAGNOSIS — R5383 Other fatigue: Secondary | ICD-10-CM | POA: Diagnosis not present

## 2019-04-14 MED ORDER — BENZONATATE 200 MG PO CAPS: 200.0000 mg | ORAL_CAPSULE | Freq: Two times a day (BID) | ORAL | 0 refills | Status: DC | PRN

## 2019-04-14 MED ORDER — DOXYCYCLINE HYCLATE 100 MG PO TABS: 100.0000 mg | ORAL_TABLET | Freq: Two times a day (BID) | ORAL | 0 refills | Status: DC

## 2019-04-14 MED ORDER — AZELASTINE HCL 0.1 % NA SOLN: 2.0000 | Freq: Two times a day (BID) | NASAL | 12 refills | Status: DC

## 2019-04-14 NOTE — Telephone Encounter (Signed)
-----   Message from [place], [place] sent at 04/14/2019 10:58 AM EDT ----- Regarding: Covid Testing  MRN: [id number] DOB [birth year]  [facility] ID [id number]      Covid Symptoms

## 2019-04-14 NOTE — Progress Notes (Signed)
   Chief Complaint:  Sarah Cardenas is a 33 y.o. female who presents today for a virtual office visit with a chief complaint of cough.   Assessment/Plan:  Cough / Rhinorrhea / Fatigue Patient concerned about possible sinusitis or bronchitis.  Symptoms consistent with possible COVID-19.  Will send for testing.  We will also start symptomatic treatment with Astelin nasal spray and Tessalon for her cough.  Will send in a "pocket prescription" for doxycycline with strict instruction to not start unless symptoms worsen or not improve the next few days.  Does not currently have any red flags.  Discussed reasons to return to care.  Follow-up as needed.    Subjective:  HPI:  Cough Started a couple of days ago. Seems to be getting worse.  Associated with rhinorrhea and sinus pressure.  She has also had increasing fatigue and malaise over last couple days.  No fever or chills.  No body aches.  No known sick contacts.  Cough is nonproductive.  No treatments tried.  No other obvious alleviating or aggravating factors.  ROS: Per HPI  PMH: She reports that she has been smoking cigarettes. She has a 5.00 pack-year smoking history. She has never used smokeless tobacco. She reports current alcohol use. She reports that she does not use drugs.      Objective/Observations  Physical Exam: Gen: NAD, resting comfortably Pulm: Normal work of breathing Neuro: Grossly normal, moves all extremities Psych: Normal affect and thought content  Virtual Visit via Video   I connected with Sarah Cardenas on 04/14/19 at 11:20 AM EDT by a video enabled telemedicine application and verified that I am speaking with the correct person using two identifiers. I discussed the limitations of evaluation and management by telemedicine and the availability of in person appointments. The patient expressed understanding and agreed to proceed.   Patient location: Home Provider location: [place]  participating in the virtual visit: Myself and Patient     Algis Greenhouse. Jerline Pain, MD 04/14/2019 10:45 AM

## 2019-04-14 NOTE — Telephone Encounter (Signed)
Pt scheduled for covid testing today @ [facility] @ 12:15

## 2019-04-19 LAB — NOVEL CORONAVIRUS, NAA: SARS-CoV-2, NAA: NOT DETECTED

## 2019-05-24 ENCOUNTER — Telehealth (HOSPITAL_COMMUNITY): Payer: Self-pay | Admitting: Professional

## 2019-05-25 ENCOUNTER — Other Ambulatory Visit (HOSPITAL_COMMUNITY): Payer: No Typology Code available for payment source | Attending: Family | Admitting: Licensed Clinical Social Worker

## 2019-05-25 ENCOUNTER — Other Ambulatory Visit: Payer: Self-pay

## 2019-05-25 DIAGNOSIS — F17210 Nicotine dependence, cigarettes, uncomplicated: Secondary | ICD-10-CM | POA: Diagnosis not present

## 2019-05-25 DIAGNOSIS — F419 Anxiety disorder, unspecified: Secondary | ICD-10-CM | POA: Insufficient documentation

## 2019-05-25 DIAGNOSIS — Z79899 Other long term (current) drug therapy: Secondary | ICD-10-CM | POA: Insufficient documentation

## 2019-05-25 DIAGNOSIS — F3130 Bipolar disorder, current episode depressed, mild or moderate severity, unspecified: Secondary | ICD-10-CM | POA: Insufficient documentation

## 2019-05-25 DIAGNOSIS — R42 Dizziness and giddiness: Secondary | ICD-10-CM | POA: Insufficient documentation

## 2019-05-25 DIAGNOSIS — R45851 Suicidal ideations: Secondary | ICD-10-CM | POA: Insufficient documentation

## 2019-05-25 DIAGNOSIS — F410 Panic disorder [episodic paroxysmal anxiety] without agoraphobia: Secondary | ICD-10-CM | POA: Insufficient documentation

## 2019-05-25 DIAGNOSIS — R4589 Other symptoms and signs involving emotional state: Secondary | ICD-10-CM | POA: Insufficient documentation

## 2019-05-25 DIAGNOSIS — F314 Bipolar disorder, current episode depressed, severe, without psychotic features: Secondary | ICD-10-CM

## 2019-05-26 ENCOUNTER — Other Ambulatory Visit (HOSPITAL_COMMUNITY): Payer: No Typology Code available for payment source | Admitting: Licensed Clinical Social Worker

## 2019-05-26 ENCOUNTER — Other Ambulatory Visit: Payer: Self-pay

## 2019-05-26 DIAGNOSIS — F3130 Bipolar disorder, current episode depressed, mild or moderate severity, unspecified: Secondary | ICD-10-CM

## 2019-05-26 DIAGNOSIS — F314 Bipolar disorder, current episode depressed, severe, without psychotic features: Secondary | ICD-10-CM | POA: Insufficient documentation

## 2019-05-26 NOTE — Progress Notes (Signed)
Virtual Visit via Telephone Note  I connected with Sarah Cardenas on 05/26/19 at  9:00 AM EDT by telephone and verified that I am speaking with the correct person using two identifiers.   I discussed the limitations, risks, security and privacy concerns of performing an evaluation and management service by telephone and the availability of in person appointments. I also discussed with the patient that there may be a patient responsible charge related to this service. The patient expressed understanding and agreed to proceed.   I discussed the assessment and treatment plan with the patient. The patient was provided an opportunity to ask questions and all were answered. The patient agreed with the plan and demonstrated an understanding of the instructions.   The patient was advised to call back or seek an in-person evaluation if the symptoms worsen or if the condition fails to improve as anticipated.  I provided 30 minutes of non-face-to-face time during this encounter.   Sarah Rackanika N Lujuana Kapler, NP    Behavioral Health Partial Program Assessment Note  Date: 05/26/2019 Name: Sarah Cardenas MRN: [id number]   NWG:NFAOZHPI:Sarah Cardenas  is a 33 y.o. Caucasian female presents with worsening depression and suicidal ideations .  Sarah Cardenas reported a recent discharge from [facility] for medication management mainly.  She reported feeling better since her discharge.  Reports a follow-up with her attending psychiatrist Richardson Doppole.  Patient is requesting medication samples of Vraylar that [facility].reported concerns with affording medications however states that she has a follow-up appointment in 3 days.  Patient reports she continues to struggle with depression, anxiety poor concentration and intermittent suicidal ideations.  Reports previous inpatient admission.  Reports she has not been followed by psychiatrist in a while.  States she is currently prescribed reported history of substance  abuse.  Currently denying cravings or withdrawal symptoms.  Reported she recently stopped drinking x 1 week ago. Patient was enrolled in partial psychiatric program on 05/26/19.  Primary complaints include: anxiety, depression worse and feeling depressed.  Onset of symptoms was gradual with gradually worsening course since that time. Psychosocial Stressors include the following: family.  Denies physical or sexual abuse in the past.  Reports a family history with mental illness.  Reported previous inpatient admissions.  States attending outpatient admission for substance abuse.  I have reviewed the following documentation dated  past psychiatric history, past medical history and past Review of systems  Complaints of Pain:  Past Psychiatric History:  First psychiatric contact  and Past psychiatric hospitalizations   Currently in treatment with Lamictal, Varlary, Vistaril, Zoloft and Provera   Substance Abuse History: alcohol, marijuana and cocaine Use of Alcohol: minimizes use  Use of Caffeine: denies use Use of over the counter:   Past Surgical History:  Procedure Laterality Date  . BREAST SURGERY     lump removed  . ear drum replacement     right  . FRACTURE SURGERY      Past Medical History:  Diagnosis Date  . Anxiety   . Bipolar affect, depressed (HCC)   . Depression    Outpatient Encounter Medications as of 05/26/2019  Medication Sig  . azelastine (ASTELIN) 0.1 % nasal spray Place 2 sprays into both nostrils 2 (two) times daily. Use in each nostril as directed  . benzonatate (TESSALON) 200 MG capsule Take 1 capsule (200 mg total) by mouth 2 (two) times daily as needed for cough.  . doxycycline (VIBRA-TABS) 100 MG tablet Take 1 tablet (100 mg total)  by mouth 2 (two) times daily.  . hydrOXYzine (ATARAX/VISTARIL) 25 MG tablet Take 25 mg by mouth 2 (two) times daily at 8 am and 10 pm.   . ibuprofen (ADVIL,MOTRIN) 200 MG tablet Take 400 mg by mouth every 6 (six) hours as needed.  Marland Kitchen  ketorolac (TORADOL) 10 MG tablet Take 1 tablet (10 mg total) by mouth every 6 (six) hours as needed.  . lamoTRIgine (LAMICTAL) 150 MG tablet Take 150 mg by mouth 2 (two) times daily.  Marland Kitchen lurasidone (LATUDA) 80 MG TABS tablet Take 80 mg by mouth daily with breakfast.  . norgestimate-ethinyl estradiol (ESTARYLLA) 0.25-35 MG-MCG tablet Estarylla 0.25 mg-35 mcg tablet  . prazosin (MINIPRESS) 2 MG capsule TK 2 CS PO QHS  . sertraline (ZOLOFT) 100 MG tablet TK 1 T PO QAM  . tiZANidine (ZANAFLEX) 2 MG tablet Take 1 tablet (2 mg total) by mouth every 8 (eight) hours as needed for muscle spasms.  . VENTOLIN HFA 108 (90 Base) MCG/ACT inhaler INL 2 PFS ITL Q 4 H FOR UP TO 10 DAYS PRF WHZ OR SOB   No facility-administered encounter medications on file as of 05/26/2019.    Allergies  Allergen Reactions  . Ciprofloxacin Other (See Comments)    numbness    Social History   Tobacco Use  . Smoking status: Current Some Day Smoker    Packs/day: 0.50    Years: 10.00    Pack years: 5.00    Types: Cigarettes  . Smokeless tobacco: Never Used  Substance Use Topics  . Alcohol use: Yes    Comment: occ   Functioning Relationships: good support system Education: Other  Other Pertinent History: None Family History  Problem Relation Age of Onset  . Bipolar disorder Father   . Drug abuse Father   . Breast cancer Neg Hx   . Colon cancer Neg Hx      Review of Systems Constitutional: negative  Objective:  There were no vitals filed for this visit.  Physical Exam:   Mental Status Exam: Appearance:  Well groomed Psychomotor::   Attention span and concentration: Normal Behavior: calm and cooperative Speech:  normal pitch Mood:  depressed and anxious Affect:  normal Thought Process:  Coherent Thought Content:  WDL Orientation:  person, place and time/date Cognition:  grossly intact Insight:  Intact Judgment:  Intact Estimate of Intelligence: Average Fund of knowledge: Aware of current  events Memory: Recent and remote intact Abnormal movements: None Gait and station: Normal  Assessment:  Diagnosis: No primary diagnosis found. No diagnosis found.  Indications for admission: inpatient care required if not in partial hospital program  Plan: Orders placed for occupational therapy patient enrolled in Partial Hospitalization Program, patient's current medications are to be continued, a comprehensive treatment plan will be developed and side effects of medications have been reviewed with patient  Treatment options and alternatives reviewed with patient and patient understands the above plan. Treatment plan was reviewed and agreed upon by NP T.Bobby Rumpf and patient Budd Palmer need for group services. -Follow-up appointment  with MD Landry Mellow and Therapist with [facility]    Comments:  Patient was provided with medication samples  .    Derrill Center, NP

## 2019-05-26 NOTE — Psych (Signed)
Virtual Visit via Video Note  I connected with Sarah DakinAmber Nicole Rister on 05/25/2019 at 11:30 AM EDT by a video enabled telemedicine application and verified that I am speaking with the correct person using two identifiers.   I discussed the limitations of evaluation and management by telemedicine and the availability of in person appointments. The patient expressed understanding and agreed to proceed.   I discussed the assessment and treatment plan with the patient. The patient was provided an opportunity to ask questions and all were answered. The patient agreed with the plan and demonstrated an understanding of the instructions.   The patient was advised to call back or seek an in-person evaluation if the symptoms worsen or if the condition fails to improve as anticipated.  I provided 60 minutes of non-face-to-face time during this encounter.   Quinn AxeWhitney J Debbie Yearick, [facility]      Comprehensive Clinical Assessment (CCA) Note  05/26/2019 Sarah Cardenas [id number]  Visit Diagnosis:      ICD-10-CM   1. Bipolar 1 disorder, depressed, severe (HCC)  F31.4       CCA Part One  Part One has been completed on paper by the patient.  (See scanned document in Chart Review)  CCA Part Two A  Intake/Chief Complaint:  CCA Intake With Chief Complaint CCA Part Two Date: 05/25/19 CCA Part Two Time: 1130 Chief Complaint/Presenting Problem: Pt reports to PHP per followup from inpt at [facility]. Pt was inpt due to suicide attempt by drinking and hanging. Pt reports she was inpt for 6 days. Pt reports 2 inpt stays prior: [facility] in Oct. 2018 for med management; when pt was 13 at [facility] for suicide attempt by cutting. Pt reports she was seeing Hal Neerebecca Austin for 6-7 years for counseling. Pt recently decided to switch therapists "because I got everything I could from her. I needed a new ear to listen." Pt started seeing Patty Hickman about 4 weeks ago. Pt reports she saw Starling MannsJoe Hughes at [facility] for  6-7 years but wants to switch psychiatrists at this time. Pt reports her depression had become increasingly worse for 6 weeks prior to attempt and had symptoms including lack of motivation, anhedonia, and decreased ADLS- not showering for weeks, not leaving the couch, not cooking or cleaning. 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 Pt reports she was drinking 1 bottle of wine 2-3x a week prior to inpt stay. Pt has committed to not drinking and feels it is not a primary focus of her mental health treatment because she knows she was using it as a coping mechanism. Pt denies any withdrawal symptoms or cravings; reports she hasn't had anything to drink since attempt on 7/24. Pt reports continued passive SI, denies intent/plan. Pt denies HI/AVH. Patients Currently Reported Symptoms/Problems: Pt reports recent 6 day inpt stay for suicide attempt by hanging; increased depression and anxiety; continued passive SI; lack of motivation; decreased ADLS- showering 1x a week at max, not cooking or cleaning, pt reports rarely getting off the couch; lack of energy; anhedonia; feelings of hopelessness/worthlessness; decreased sleep- can't stay  asleep; mood swings; irritability; excessive worrying; feelings of loneliness; work problems- suspension from work; poor concentration Collateral Involvement: Supportive family; motivation for treatment Individual's Strengths: Pt enjoys being around people, but also likes her alone time.  Motivated for treatment. Individual's Preferences: Being alone. Individual's Abilities: Can participate in  group Type of Services Patient Feels Are Needed: PHP- pt wants group therapy because "I get a lot from hearing from other people in similar shoes as me."  Mental Health Symptoms Depression:  Depression: Change in energy/activity, Difficulty Concentrating, Increase/decrease in appetite, Irritability, Sleep (too much or little), Tearfulness, Weight gain/loss, Fatigue, Hopelessness, Worthlessness  Mania:  Mania: N/A  Anxiety:   Anxiety: Worrying, Tension, Sleep, Irritability, Fatigue, Difficulty concentrating  Psychosis:  Psychosis: N/A  Trauma:  Trauma: N/A  Obsessions:  Obsessions: N/A  Compulsions:  Compulsions: N/A  Inattention:  Inattention: N/A  Hyperactivity/Impulsivity:  Hyperactivity/Impulsivity: N/A  Oppositional/Defiant Behaviors:  Oppositional/Defiant Behaviors: N/A  Borderline Personality:  Emotional Irregularity: N/A  Other Mood/Personality Symptoms:      Mental Status Exam Appearance and self-care  Stature:  Stature: Average  Weight:  Weight: Overweight  Clothing:  Clothing: Casual  Grooming:  Grooming: Normal(Pt reports she has showered more since being d/c from inpt but not to "normal level of daily showers")  Cosmetic use:  Cosmetic Use: None  Posture/gait:  Posture/Gait: Normal  Motor activity:  Motor Activity: Not Remarkable  Sensorium  Attention:  Attention: Normal  Concentration:  Concentration: Normal  Orientation:  Orientation: X5  Recall/memory:  Recall/Memory: Normal  Affect and Mood  Affect:  Affect: Appropriate, Depressed  Mood:  Mood: Depressed  Relating  Eye contact:  Eye Contact: Normal  Facial expression:  Facial Expression: Responsive  Attitude toward examiner:  Attitude Toward Examiner: Cooperative  Thought and Language  Speech flow: Speech Flow: Normal  Thought content:  Thought Content: Appropriate to mood and circumstances  Preoccupation:     Hallucinations:     Organization:     Company secretaryxecutive Functions  Fund of Knowledge:  Fund of Knowledge:  Average  Intelligence:  Intelligence: Average  Abstraction:  Abstraction: Normal  Judgement:  Judgement: Poor  Reality Testing:  Reality Testing: Adequate  Insight:  Insight: Flashes of insight  Decision Making:  Decision Making: Impulsive  Social Functioning  Social Maturity:  Social Maturity: Isolates, Impulsive  Social Judgement:  Social Judgement: Normal  Stress  Stressors:  Stressors: Illness, Transitions, Work, Scientist, forensicMoney  Coping Ability:  Coping Ability: Horticulturist, commercialxhausted  Skill Deficits:     Supports:      Family and Psychosocial History: Family history Marital status: Single Are you sexually active?: No What is your sexual orientation?: heterosexual Does patient have children?: No  Childhood History:  Childhood History By whom was/is the patient raised?: Mother Additional childhood history information: Parents divorced when pt was in fifth grade.  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 Description of patient's relationship with caregiver when they were a child: Very close to mother.  Father was Bipolar with addiction. Patient's description of current relationship with people who raised him/her: Pt reports father committed suicide. Close relationship with mother Does patient have siblings?: Yes Number of Siblings: 2 Description of patient's current relationship with siblings: Sisters; close with both Did patient suffer any verbal/emotional/physical/sexual abuse as a child?: No Did patient suffer from severe childhood neglect?: No Has patient ever been sexually abused/assaulted/raped as an adolescent or adult?: No Was the patient ever a victim of a  crime or a disaster?: No Witnessed domestic violence?: No Has patient been effected by domestic violence as an adult?: No  CCA Part Two B  Employment/Work Situation: Employment / Work Copywriter, advertising Employment situation: Employed Where is patient currently employed?: [employer] How long has patient been employed?: 5 years Patient's job has been impacted by current illness: Yes Describe how patient's job has been impacted: Pt reports she was suspended from work about 6 weeks ago due to not showing up and increased symptoms Did You Receive Any Psychiatric Treatment/Services While in the [employer]?: No Are There Guns or Other Weapons in [place]?: No  Education: Education Did Teacher, adult education From [employer]?: Yes Did Physicist, medical?: Yes What Type of College Degree Do you Have?: Pt graduated from cosmotology school Did Mission?: No Did You Have An Individualized Education Program (IIEP): No Did You Have Any Difficulty At [employer]?: Yes Were Any Medications Ever Prescribed For These Difficulties?: Yes Medications Prescribed For School Difficulties?: ADHD meds  Religion: Religion/Spirituality Are You A Religious Person?: Yes What is Your Religious Affiliation?: Christian How Might This Affect Treatment?: It wont  Leisure/Recreation: Leisure / Recreation Leisure and Hobbies: "nothing right now"  Exercise/Diet: Exercise/Diet Do You Exercise?: No Have You Gained or Lost A Significant Amount of Weight in the Past Six Months?: No Do You Follow a Special Diet?: No Do You Have Any Trouble Sleeping?: Yes Explanation of Sleeping Difficulties: Pt reports trouble staying asleep  CCA Part Two C  Alcohol/Drug Use: Alcohol / Drug Use Pain Medications: see MAR Prescriptions: see MAr Over the Counter: see MAR History of alcohol / drug use?: Yes Longest period of sobriety (when/how long): 11 years for cocaine, pills, marijuana Substance #1 Name of Substance 1: Cocaine 1 - Last Use / Amount: 11 years ago Substance #2 Name of Substance 2: Marijuana 2 - Last Use / Amount: 11 years ago Substance #3 Name of Substance 3: "Pills" 3 - Last Use / Amount: 11 years ago Substance #4 Name of Substance 4: Nicotine 4 - Age of First Use: 14 4 -  Amount (size/oz): 1 pack a day 4 - Frequency: daily 4 - Duration: "years" 4 - Last Use / Amount: today Substance #5 Name of Substance 5: Alcohol 5 - Age of First Use: 14 5 - Amount (size/oz): 1 bottle of wine 2-3x a week for about 6 months 5 - Frequency: 2-3x a week 5 - Duration: 6 months 5 - Last Use / Amount: 05/14/19            CCA Part Three  ASAM's:  Six Dimensions of Multidimensional Assessment  Dimension 1:  Acute Intoxication and/or Withdrawal Potential:     Dimension 2:  Biomedical Conditions and Complications:     Dimension 3:  Emotional, Behavioral, or Cognitive Conditions and Complications:     Dimension 4:  Readiness to Change:     Dimension 5:  Relapse, Continued use, or Continued Problem Potential:     Dimension 6:  Recovery/Living Environment:      Substance use Disorder (SUD)    Social Function:  Social Functioning Social Maturity: Isolates, Impulsive Social Judgement: Normal  Stress:  Stress Stressors: Illness, Transitions, Work, Chiropodist Coping Ability: Exhausted Patient Takes Medications The Way The Doctor Instructed?: Yes Priority Risk: Moderate Risk  Risk Assessment- Self-Harm Potential: Risk Assessment For Self-Harm Potential Thoughts of Self-Harm: Vague current thoughts Method: No plan Additional Information for Self-Harm Potential: Acts of Self-harm, Family History of Suicide, Previous Attempts  Additional Comments for Self-Harm Potential: Pt reports 2 previous attempts: 1 at 13 by cutting; 1 on 05/14/19 by hanging/drinking. Pt's father committed suicide by hanging. Pt reports hx of self-harm acts of cutting and burning. Pt reports "I haven't done that in a couple of years."  Risk Assessment -Dangerous to Others Potential: Risk Assessment For Dangerous to Others Potential Method: No Plan  DSM5 Diagnoses: Patient Active Problem List   Diagnosis Date Noted  . Bipolar 1 disorder, depressed, severe (HCC) 05/26/2019  . Cervical intraepithelial  neoplasia grade 1 12/07/2018    Patient Centered Plan: Patient is on the following Treatment Plan(s):  Depression  Recommendations for Services/Supports/Treatments: Recommendations for Services/Supports/Treatments Recommendations For Services/Supports/Treatments: Partial Hospitalization(Pt reports to PHP per inpt. Pt was inpt due to suicide attempt by hanging. Pt has hx of 1 other attempt, self harm acts, and father committed suicide. Pt reports she beneftis from group therapy"because I like hearing from other people like me.")  Treatment Plan Summary:   Pt states, "I just want to feel better. I don't want all this heaviness in my heart. I want to be surrounded by people that are like me that can give me the support I need."  Referrals to Alternative Service(s): Referred to Alternative Service(s):   Place:   Date:   Time:    Referred to Alternative Service(s):   Place:   Date:   Time:    Referred to Alternative Service(s):   Place:   Date:   Time:    Referred to Alternative Service(s):   Place:   Date:   Time:     Quinn AxeWhitney J Ramon Brant, [facility], LCASA

## 2019-05-26 NOTE — Psych (Signed)
Virtual Visit via Video Note  I connected with Sarah Cardenas on 05/26/19 at  9:00 AM EDT by a video enabled telemedicine application and verified that I am speaking with the correct person using two identifiers.   I discussed the limitations of evaluation and management by telemedicine and the availability of in person appointments. The patient expressed understanding and agreed to proceed.    I discussed the assessment and treatment plan with the patient. The patient was provided an opportunity to ask questions and all were answered. The patient agreed with the plan and demonstrated an understanding of the instructions.   The patient was advised to call back or seek an in-person evaluation if the symptoms worsen or if the condition fails to improve as anticipated.  I provided 10 minutes of non-face-to-face time during this encounter.   [place], LCMHCA, LCASA

## 2019-05-27 ENCOUNTER — Other Ambulatory Visit: Payer: Self-pay

## 2019-05-27 ENCOUNTER — Other Ambulatory Visit (HOSPITAL_COMMUNITY): Payer: No Typology Code available for payment source | Admitting: Occupational Therapy

## 2019-05-27 ENCOUNTER — Other Ambulatory Visit (HOSPITAL_COMMUNITY): Payer: No Typology Code available for payment source | Admitting: Licensed Clinical Social Worker

## 2019-05-27 DIAGNOSIS — F3130 Bipolar disorder, current episode depressed, mild or moderate severity, unspecified: Secondary | ICD-10-CM

## 2019-05-27 DIAGNOSIS — R4589 Other symptoms and signs involving emotional state: Secondary | ICD-10-CM

## 2019-05-28 ENCOUNTER — Encounter (HOSPITAL_COMMUNITY): Payer: Self-pay | Admitting: Professional

## 2019-05-28 ENCOUNTER — Other Ambulatory Visit (HOSPITAL_COMMUNITY): Payer: No Typology Code available for payment source | Admitting: Licensed Clinical Social Worker

## 2019-05-28 ENCOUNTER — Other Ambulatory Visit (HOSPITAL_COMMUNITY): Payer: No Typology Code available for payment source | Admitting: Occupational Therapy

## 2019-05-28 ENCOUNTER — Other Ambulatory Visit: Payer: Self-pay

## 2019-05-28 ENCOUNTER — Encounter (HOSPITAL_COMMUNITY): Payer: Self-pay

## 2019-05-28 DIAGNOSIS — F3130 Bipolar disorder, current episode depressed, mild or moderate severity, unspecified: Secondary | ICD-10-CM

## 2019-05-28 DIAGNOSIS — R4589 Other symptoms and signs involving emotional state: Secondary | ICD-10-CM

## 2019-05-28 NOTE — Progress Notes (Signed)
Spoke with patient via Webex video call, used 2 identifiers to correctly identify patient. States she was recently inpatient at [facility] but once discharged they did not recommend outpatient so she decided to research programs herself. She enrolled in PHP to get help with anxiety and depression. She was inpatient for suicidal thoughts and has been inpatient before in the past. Did IOP 2 years ago with [facility]. On scale of 1-10 as 10 being worst she rates depression at 5 and anxiety at 6. Had several medication changes while inpatient. Increased Lamictal to 200mg  BID, increased Prazosin to 4mg  qhs, increased Zoloft to 150mg  qam, and added Trazodone 50mg  qhs prn, Vraylar 1.5mg  qam. Does not feel that her anxiety medication is working and would like to discuss it with the NP. She has to take one soon as she gets out of bed in the morning, at lunch time and then again before bed. She has panic attacks at least 1 time a week and gets anxious during group therapy. Denies SI/HI or AV hallucinations. So far she is enjoying the groups.

## 2019-05-30 ENCOUNTER — Encounter (HOSPITAL_COMMUNITY): Payer: Self-pay

## 2019-05-30 ENCOUNTER — Encounter (HOSPITAL_COMMUNITY): Payer: Self-pay | Admitting: Occupational Therapy

## 2019-05-30 ENCOUNTER — Other Ambulatory Visit: Payer: Self-pay

## 2019-05-30 NOTE — Therapy (Signed)
[place], [place], [address] Phone: [phone]   Fax:  [phone]  Occupational Therapy Evaluation  Patient Details  Name: Sarah Cardenas MRN: [id number] Date of Birth: [birth year] Referring Provider (OT): Ricky Ala, NP   Virtual Visit via Video Note  I connected with Sarah Cardenas on 05/30/19 at  8:00 AM EDT by a video enabled telemedicine application and verified that I am speaking with the correct person using two identifiers.   I discussed the limitations of evaluation and management by telemedicine and the availability of in person appointments. The patient expressed understanding and agreed to proceed.  I discussed the assessment and treatment plan with the patient. The patient was provided an opportunity to ask questions and all were answered. The patient agreed with the plan and demonstrated an understanding of the instructions.   The patient was advised to call back or seek an in-person evaluation if the symptoms worsen or if the condition fails to improve as anticipated.  I provided 90 minutes of non-face-to-face time during this encounter.  Zenovia Jarred, MSOT, OTR/L Behavioral Health OT/ Acute Relief OT PHP Office: [phone]  Zenovia Jarred, [place]    Encounter Date: 05/27/2019  OT End of Session - 05/30/19 1959    Visit Number  1    Number of Visits  12    Date for OT Re-Evaluation  06/27/19    Authorization Type  [employer]    OT Start Time  1100    OT Stop Time  1230    OT Time Calculation (min)  90 min    Activity Tolerance  Patient tolerated treatment well    Behavior During Therapy  [facility] for tasks assessed/performed       Past Medical History:  Diagnosis Date  . Anxiety   . Bipolar affect, depressed ([place])   . Depression     Past Surgical History:  Procedure Laterality Date  . BREAST SURGERY     lump removed  . ear drum replacement     right  . FRACTURE  SURGERY      There were no vitals filed for this visit.  Subjective Assessment - 05/30/19 1958    Currently in Pain?  No/denies        [facility] OT Assessment - 05/30/19 0001      Assessment   Medical Diagnosis  Bipolar 1 disorder, most recent episode depressed    Referring Provider (OT)  Ricky Ala, NP    Onset Date/Surgical Date  05/30/19      Precautions   Precautions  None      Restrictions   Weight Bearing Restrictions  No      Balance Screen   Has the patient fallen in the past 6 months  No    Has the patient had a decrease in activity level because of a fear of falling?   No    Is the patient reluctant to leave their home because of a fear of falling?   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  Work: Works as a Freight forwarder  Leisure: spending time with family  Social support: more increasingly isolated  [place]  of Client Scores:  FACILITATES PARTICIPATION IN OCCUPATION  ALLOWS PARTICIPATION IN OCCUPATION INHIBITS PARTICIPATION IN OCCUPATION RESTRICTS PARTICIPATION IN OCCUPATION COMMENTS  ROLES            X    HABITS            X    PERSONAL CAUSATION                      X   VALUES            X    INTERESTS            X    SKILLS            X    SHORT TERM GOALS            X    LONG TERM GOALS             X   INTERPETATION OF PAST EXPERIENCES            X    PHYSICAL ENVIRONMENT           X     SOCIAL ENVIRONMENT            X    READINESS FOR CHANGE            X      Need for Occupational Therapy:  4 Shows positive occupational participation, no need for OT.   3 Need for minimal intervention/consultative participation     X 2 Need for OT intervention indicated to restore/improve participation   1 Need for extensive OT intervention indicated to improve participation.  Referral for follow up services also recommended.    Assessment:  Patient demonstrates behavior that inhibits participation in occupation.  Patient will benefit from occupational therapy intervention in order to improve time management, financial management, stress management, job readiness skills, social skills, and health management skills in preparation to return to full time community living and to be a productive community member.    Plan:  Patient will participate in skilled occupational therapy sessions individually or in a group setting to improve coping skills, psychosocial skills, and emotional skills required to return to prior level of function.  Treatment will be 3 times per week for 4 weeks.      S: relaxation techniques are helpful to me  O: Pt educated on sleep hygiene as it pertains to daily life/routines this date. Education given on appropriate sleep routines, sleep disorders, detriments of too much/too little sleep with encouraged feedback of personal Experiences. Sleep hygiene handout given for pt to choose new areas for implementation in BADL routine. Further education given on relaxation techniques to implement before bed. Pt asked to identify one STG in relation to sleep hygiene to create better daily sleep habits.   A: Pt presents to group with flat affect, engaged and participatory. 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 She shares how she would like to implement using relaxation techniques and apps to continue aiding in her sleep hygiene skills building.   P: OT Group will be x3 per week while pt in PHP         OT Education - 05/30/19 1958    Education Details  education given on sleep hygiene    Person(s) Educated  Patient    Methods  Explanation;Handout     Comprehension  Verbalized understanding  OT Short Term Goals - 05/30/19 2001      OT SHORT TERM GOAL #1   Title  Pt will be educated on strategies to improve psychosocial skills and coping mechanisms needed to participate in all daily, work, and leisure activities    Time  4    Period  Weeks    Status  New    Target Date  06/27/19      OT SHORT TERM GOAL #2   Title  Pt will apply psychosocial skills and coping mechanisms to daily activities in order to function independently and reintegrate into community    Time  4    Period  Weeks    Status  New      OT SHORT TERM GOAL #3   Title  Pt will recall and/or apply 1-3 sleep hygiene strategies to improve BADL function prior to community reintegration    Time  4    Period  Weeks    Status  New      OT SHORT TERM GOAL #4   Title  Pt will engage in goal setting to improve functional BADL/IADL routine prior to reintegrating into community    Time  4    Period  Weeks      OT SHORT TERM GOAL #5   Title  Pt will identify 1-3 workplace stress strategies to implement in rotuine prior to RTW    Time  4    Period  Weeks    Status  New               Plan - 05/30/19 2000    OT Occupational Profile and History  Detailed Assessment- Review of Records and additional review of physical, cognitive, psychosocial history related to current functional performance    Occupational performance deficits (Please refer to evaluation for details):  ADL's;IADL's;Rest and Sleep;Work;Social Participation;Leisure    Body Structure / Function / Physical Skills  ADL;IADL    Psychosocial Skills  Coping Strategies;Routines and Behaviors;Interpersonal Interaction    Rehab Potential  Good    Clinical Decision Making  Limited treatment options, no task modification necessary    Comorbidities Affecting Occupational Performance:  None    Modification or Assistance to Complete Evaluation   No modification of tasks or assist necessary to complete eval     OT Frequency  3x / week    OT Duration  4 weeks    OT Treatment/Interventions  Coping strategies training;Psychosocial skills training;Self-care/ADL training;Other (comment)   community reintegration   Consulted and Agree with Plan of Care  Patient       Patient will benefit from skilled therapeutic intervention in order to improve the following deficits and impairments:   Body Structure / Function / Physical Skills: ADL, IADL   Psychosocial Skills: Coping Strategies, Routines and Behaviors, Interpersonal Interaction   Visit Diagnosis: 1. Bipolar I disorder, most recent episode depressed (HCC)   2. Difficulty coping       Problem List Patient Active Problem List   Diagnosis Date Noted  . Bipolar 1 disorder, depressed, severe (HCC) 05/26/2019  . Cervical intraepithelial neoplasia grade 1 12/07/2018   Dalphine HandingKaylee Vir Whetstine, MSOT, OTR/L Behavioral Health OT/ Acute Relief OT PHP Office: [phone]  Dalphine HandingKaylee Maurissa Ambrose 05/30/2019, 8:11 PM  [facility] BE[facility] PARTIAL HOSPITALIZATION PROGRAM [address] [place], [place], [address] Phone: [phone]   Fax:  [phone]  Name: Sarah Cardenas MRN: [id number] Date of Birth: [birth year]

## 2019-05-30 NOTE — Therapy (Signed)
[facility] PART[facility]GRAM [address] [place], [place], [address] Phone: [phone]   Fax:  [phone]  Occupational Therapy Treatment  Patient Details  Name: Sarah Cardenas MRN: [id number] Date of Birth: 21-Feb-1986 Referring Provider (OT): Sarah Jacksanika Lewis, NP  Virtual Visit via Video Note  I connected with Sarah Cardenas on 05/30/19 at  8:00 AM EDT by a video enabled telemedicine application and verified that I am speaking with the correct person using two identifiers.   I discussed the limitations of evaluation and management by telemedicine and the availability of in person appointments. The patient expressed understanding and agreed to proceed.   I discussed the assessment and treatment plan with the patient. The patient was provided an opportunity to ask questions and all were answered. The patient agreed with the plan and demonstrated an understanding of the instructions.   The patient was advised to call back or seek an in-person evaluation if the symptoms worsen or if the condition fails to improve as anticipated.  I provided 60 minutes of non-face-to-face time during this encounter.  Sarah HandingKaylee Lyrical Cardenas, MSOT, OTR/L Behavioral Health OT/ Acute Relief OT PHP Office: [phone]  Sarah [place], [place]    Encounter Date: 05/28/2019  OT End of Session - 05/30/19 2056    Visit Number  2    Number of Visits  12    Date for OT Re-Evaluation  06/27/19    Authorization Type  UHC    OT Start Time  1100    OT Stop Time  1200    OT Time Calculation (min)  60 min    Activity Tolerance  Patient tolerated treatment well    Behavior During Therapy  [facility] for tasks assessed/performed       Past Medical History:  Diagnosis Date  . Anxiety   . Bipolar affect, depressed (HCC)   . Depression     Past Surgical History:  Procedure Laterality Date  . BREAST SURGERY     lump removed  . ear drum replacement     right  . FRACTURE  SURGERY      There were no vitals filed for this visit.  Subjective Assessment - 05/30/19 2055    Currently in Pain?  No/denies      S: Mine are personal and I do not want to share  O: Education given on self-accountability being in line with personal values and goals to maintain occupational balance in various community settings. Pt given goal identifying worksheet to list immediate, short term, medium term, and long-term goals using a SMART goal framework (specificity, meaningful, adaptive, realistic, and time bound). Goals created as guideline for pt to practice being accountable in various situations. Pt completed work sheet of goals and encouraged to share goals with the group, with emphasis on immediate goal for check in with pt for next session to maintain accountability   A: Pt presents with flat affect, engaged and participatory but reserved. She shares that her goals are centered around personal growth/health (mentla health specifically) and work. She states that she does not want to share due to the goals being to personal, but she demonstrates full understanding of activity and completed it as advised.  P: OT group will be x3 per week while pt in PHP                 OT Education - 05/30/19 2055    Education Details  education given on goal setting  Person(s) Educated  Patient    Methods  Explanation;Handout    Comprehension  Verbalized understanding       OT Short Term Goals - 05/30/19 2056      OT SHORT TERM GOAL #1   Title  Pt will be educated on strategies to improve psychosocial skills and coping mechanisms needed to participate in all daily, work, and leisure activities    Time  4    Period  Weeks    Status  On-going    Target Date  06/27/19      OT SHORT TERM GOAL #2   Title  Pt will apply psychosocial skills and coping mechanisms to daily activities in order to function independently and reintegrate into community    Time  4    Period  Weeks     Status  On-going      OT SHORT TERM GOAL #3   Title  Pt will recall and/or apply 1-3 sleep hygiene strategies to improve BADL function prior to community reintegration    Time  4    Period  Weeks    Status  On-going      OT SHORT TERM GOAL #4   Title  Pt will engage in goal setting to improve functional BADL/IADL routine prior to reintegrating into community    Time  4    Period  Weeks    Status  On-going      OT SHORT TERM GOAL #5   Title  Pt will identify 1-3 workplace stress strategies to implement in rotuine prior to RTW    Time  4    Period  Weeks    Status  On-going               Plan - 05/30/19 2056    Occupational performance deficits (Please refer to evaluation for details):  ADL's;IADL's;Rest and Sleep;Work;Social Participation;Leisure    Body Structure / Function / Physical Skills  ADL;IADL    Psychosocial Skills  Coping Strategies;Routines and Behaviors;Interpersonal Interaction       Patient will benefit from skilled therapeutic intervention in order to improve the following deficits and impairments:   Body Structure / Function / Physical Skills: ADL, IADL   Psychosocial Skills: Coping Strategies, Routines and Behaviors, Interpersonal Interaction   Visit Diagnosis: 1. Bipolar I disorder, most recent episode depressed ([place])   2. Difficulty coping       Problem List Patient Active Problem List   Diagnosis Date Noted  . Bipolar 1 disorder, depressed, severe ([place]) 05/26/2019  . Cervical intraepithelial neoplasia grade 1 12/07/2018   Sarah Cardenas, MSOT, OTR/L Behavioral Health OT/ Acute Relief OT PHP Office: [phone]  Sarah Cardenas 05/30/2019, 8:57 PM  [facility] PARTIAL HOSPITALIZATION PROGRAM [place], [place], [address] Phone: [phone]   Fax:  [phone]  Name: Sarah Cardenas MRN: [id number] Date of Birth: [birth year]

## 2019-05-31 ENCOUNTER — Other Ambulatory Visit: Payer: Self-pay

## 2019-05-31 ENCOUNTER — Other Ambulatory Visit (HOSPITAL_COMMUNITY): Payer: No Typology Code available for payment source | Admitting: Licensed Clinical Social Worker

## 2019-05-31 DIAGNOSIS — F3130 Bipolar disorder, current episode depressed, mild or moderate severity, unspecified: Secondary | ICD-10-CM | POA: Diagnosis not present

## 2019-06-01 ENCOUNTER — Telehealth (HOSPITAL_COMMUNITY): Payer: Self-pay | Admitting: Licensed Clinical Social Worker

## 2019-06-01 ENCOUNTER — Other Ambulatory Visit (HOSPITAL_COMMUNITY): Payer: No Typology Code available for payment source | Admitting: Licensed Clinical Social Worker

## 2019-06-01 ENCOUNTER — Other Ambulatory Visit: Payer: Self-pay

## 2019-06-01 ENCOUNTER — Other Ambulatory Visit (INDEPENDENT_AMBULATORY_CARE_PROVIDER_SITE_OTHER): Payer: No Typology Code available for payment source | Admitting: Occupational Therapy

## 2019-06-01 ENCOUNTER — Encounter (HOSPITAL_COMMUNITY): Payer: Self-pay | Admitting: Family

## 2019-06-01 DIAGNOSIS — F3130 Bipolar disorder, current episode depressed, mild or moderate severity, unspecified: Secondary | ICD-10-CM | POA: Diagnosis not present

## 2019-06-01 DIAGNOSIS — R4589 Other symptoms and signs involving emotional state: Secondary | ICD-10-CM

## 2019-06-01 NOTE — Progress Notes (Signed)
BH MD/PA/NP OP Progress Note  06/01/2019 9:35 AM Pualani Anselm Pancoasticole Padron  MRN:  [id number]  Evaluation: Rhea BeltonAmber Farish seen via WebEx.  She is awake, alert and oriented x3.  She presents pleasant calm and cooperative.  Denying suicidal or homicidal ideations.  Denies auditory or visual hallucinations.  Reports worsening anxiety as she states she has been taking her Vistaril 3 times daily.  Discussed titration from 25 mg to 50 mg 3 times daily as needed patient was receptive to plan.  She reports follow-up appointment with her attending psychiatrist on this Thursday, 06/04/2019.  Patient continues to express concerns with insurance coverage of Vraylar.  Discussed changing vraylar to Abilify however patient declined states she will  follow-up with her attending psychiatrist.  She rates her depression 5 out of 10 with 10 being the worst.  Reported that she is feeling better because she is working on her relationship with her mother. States a weekend "getaway" with just she and her mother.Reports a good appetite.  States she is resting well throughout the night. "  Oversleep" some nights.  Patient is to continue partial hospitalization program.  Support, encouragement and reassurance was provided.  Visit Diagnosis:    ICD-10-CM   1. Bipolar I disorder, most recent episode depressed (HCC)  F31.30     Past Psychiatric History:   Past Medical History:  Past Medical History:  Diagnosis Date  . Anxiety   . Bipolar affect, depressed (HCC)   . Depression     Past Surgical History:  Procedure Laterality Date  . BREAST SURGERY     lump removed  . ear drum replacement     right  . FRACTURE SURGERY      Family Psychiatric History:   Family History:  Family History  Problem Relation Age of Onset  . Bipolar disorder Father   . Drug abuse Father   . Breast cancer Neg Hx   . Colon cancer Neg Hx     Social History:  Social History   Socioeconomic History  . Marital status: Single    Spouse name: Not on  file  . Number of children: Not on file  . Years of education: Not on file  . Highest education level: Not on file  Occupational History  . Not on file  Social Needs  . Financial resource strain: Not on file  . Food insecurity    Worry: Never true    Inability: Never true  . Transportation needs    Medical: No    Non-medical: No  Tobacco Use  . Smoking status: Current Some Day Smoker    Packs/day: 0.50    Years: 10.00    Pack years: 5.00    Types: Cigarettes  . Smokeless tobacco: Never Used  Substance and Sexual Activity  . Alcohol use: Yes    Comment: occ  . Drug use: No  . Sexual activity: Not Currently    Birth control/protection: Pill  Lifestyle  . Physical activity    Days per week: 0 days    Minutes per session: 0 min  . Stress: Rather much  Relationships  . Social connections    Talks on phone: More than three times a week    Gets together: Once a week    Attends religious service: More than 4 times per year    Active member of club or organization: No    Attends meetings of clubs or organizations: Never    Relationship status: Never married  Other Topics Concern  .  Not on file  Social History Narrative   HR x 6 years   Single, no children    Allergies:  Allergies  Allergen Reactions  . Ciprofloxacin Other (See Comments)    numbness    Metabolic Disorder Labs: No results found for: HGBA1C, MPG No results found for: PROLACTIN Lab Results  Component Value Date   CHOL 226 (H) 12/07/2018   TRIG 229.0 (H) 12/07/2018   HDL 52.60 12/07/2018   CHOLHDL 4 12/07/2018   VLDL 45.8 (H) 12/07/2018   Lab Results  Component Value Date   TSH 2.24 12/07/2018    Therapeutic Level Labs: No results found for: LITHIUM No results found for: VALPROATE No components found for:  CBMZ  Current Medications: Current Outpatient Medications  Medication Sig Dispense Refill  . azelastine (ASTELIN) 0.1 % nasal spray Place 2 sprays into both nostrils 2 (two) times  daily. Use in each nostril as directed (Patient not taking: Reported on 05/28/2019) 30 mL 12  . benzonatate (TESSALON) 200 MG capsule Take 1 capsule (200 mg total) by mouth 2 (two) times daily as needed for cough. (Patient not taking: Reported on 05/28/2019) 20 capsule 0  . doxycycline (VIBRA-TABS) 100 MG tablet Take 1 tablet (100 mg total) by mouth 2 (two) times daily. (Patient not taking: Reported on 05/28/2019) 14 tablet 0  . hydrOXYzine (ATARAX/VISTARIL) 25 MG tablet Take 25 mg by mouth 2 (two) times daily at 8 am and 10 pm.     . ibuprofen (ADVIL,MOTRIN) 200 MG tablet Take 400 mg by mouth every 6 (six) hours as needed.    Marland Kitchen ketorolac (TORADOL) 10 MG tablet Take 1 tablet (10 mg total) by mouth every 6 (six) hours as needed. (Patient not taking: Reported on 05/28/2019) 20 tablet 0  . lamoTRIgine (LAMICTAL) 150 MG tablet Take 150 mg by mouth 2 (two) times daily.    Marland Kitchen lurasidone (LATUDA) 80 MG TABS tablet Take 80 mg by mouth daily with breakfast.    . norgestimate-ethinyl estradiol (ESTARYLLA) 0.25-35 MG-MCG tablet Estarylla 0.25 mg-35 mcg tablet    . prazosin (MINIPRESS) 2 MG capsule TK 2 CS PO QHS    . sertraline (ZOLOFT) 100 MG tablet TK 1 T PO QAM    . tiZANidine (ZANAFLEX) 2 MG tablet Take 1 tablet (2 mg total) by mouth every 8 (eight) hours as needed for muscle spasms. (Patient not taking: Reported on 05/28/2019) 12 tablet 0  . traZODone (DESYREL) 50 MG tablet Take 50 mg by mouth at bedtime as needed for sleep.    . VENTOLIN HFA 108 (90 Base) MCG/ACT inhaler INL 2 PFS ITL Q 4 H FOR UP TO 10 DAYS PRF WHZ OR SOB     No current facility-administered medications for this visit.      Musculoskeletal: Strength & Muscle Tone: within normal limits Gait & Station: normal Patient leans: N/A  Psychiatric Specialty Exam: ROS  There were no vitals taken for this visit.There is no height or weight on file to calculate BMI.  General Appearance: Casual  Eye Contact:  Fair  Speech:  Clear and Coherent   Volume:  Normal  Mood:  Anxious  Affect:  Congruent  Thought Process:  Coherent  Orientation:  Full (Time, Place, and Person)  Thought Content: Logical   Suicidal Thoughts:  No  Homicidal Thoughts:  No  Memory:  Immediate;   Fair Recent;   Fair  Judgement:  Fair  Insight:  Fair  Psychomotor Activity:  Normal  Concentration:  Concentration: Fair  Recall:  Jennelle HumanFair  Fund of Knowledge: Fair  Language: Fair  Akathisia:  No  Handed:  Right  AIMS (if indicated):   Assets:  Communication Skills Desire for Improvement Resilience Social Support  ADL's:  Intact  Cognition: WNL  Sleep:  Fair   Screenings: PHQ2-9     Counselor from 05/28/2019 in BEHAVIORAL HEALTH PARTIAL HOSPITALIZATION PROGRAM Office Visit from [facility]/07/2018 in [facility]  PHQ-2 Total Score  2  2  PHQ-9 Total Score  11  8       Assessment and Plan:  Continue partial hospitalization programming Increase Vistaril 25 mg to 50 mg p.o. 3 times daily as needed  Treatment plan was reviewed and agreed upon by NP T. Delaine Hernandez inpatient [facility] need for continued group services   Oneta Rackanika N Taronda Comacho, NP 06/01/2019, 9:35 AM

## 2019-06-02 ENCOUNTER — Other Ambulatory Visit (HOSPITAL_COMMUNITY): Payer: No Typology Code available for payment source | Admitting: Licensed Clinical Social Worker

## 2019-06-02 ENCOUNTER — Other Ambulatory Visit: Payer: Self-pay

## 2019-06-02 ENCOUNTER — Encounter (HOSPITAL_COMMUNITY): Payer: Self-pay | Admitting: Occupational Therapy

## 2019-06-02 DIAGNOSIS — F3130 Bipolar disorder, current episode depressed, mild or moderate severity, unspecified: Secondary | ICD-10-CM | POA: Diagnosis not present

## 2019-06-02 NOTE — Therapy (Signed)
[place], [place], [address] Phone: [phone]   Fax:  [phone]  Occupational Therapy Treatment  Patient Details  Name: Sarah Cardenas MRN: [id number] Date of Birth: 24-Jun-1986 Referring Provider (OT): Ricky Ala, NP   Encounter Date: 06/01/2019    Past Medical History:  Diagnosis Date  . Anxiety   . Bipolar affect, depressed ([place])   . Depression     Past Surgical History:  Procedure Laterality Date  . BREAST SURGERY     lump removed  . ear drum replacement     right  . FRACTURE SURGERY      There were no vitals filed for this visit.      Pt originally presented to group, but left to speak with the counselor privately on the phone about feeling upset. Pt then did not return to group, safety maintained with counselor. Will follow up next date.                    OT Short Term Goals - 05/30/19 2056      OT SHORT TERM GOAL #1   Title  Pt will be educated on strategies to improve psychosocial skills and coping mechanisms needed to participate in all daily, work, and leisure activities    Time  4    Period  Weeks    Status  On-going    Target Date  06/27/19      OT SHORT TERM GOAL #2   Title  Pt will apply psychosocial skills and coping mechanisms to daily activities in order to function independently and reintegrate into community    Time  4    Period  Weeks    Status  On-going      OT SHORT TERM GOAL #3   Title  Pt will recall and/or apply 1-3 sleep hygiene strategies to improve BADL function prior to community reintegration    Time  4    Period  Weeks    Status  On-going      OT SHORT TERM GOAL #4   Title  Pt will engage in goal setting to improve functional BADL/IADL routine prior to reintegrating into community    Time  4    Period  Weeks    Status  On-going      OT SHORT TERM GOAL #5   Title  Pt will identify 1-3 workplace stress strategies  to implement in rotuine prior to RTW    Time  4    Period  Weeks    Status  On-going                 Patient will benefit from skilled therapeutic intervention in order to improve the following deficits and impairments:           Visit Diagnosis: 1. Bipolar I disorder, most recent episode depressed ([place])   2. Difficulty coping       Problem List Patient Active Problem List   Diagnosis Date Noted  . Bipolar 1 disorder, depressed, severe ([place]) 05/26/2019  . Cervical intraepithelial neoplasia grade 1 12/07/2018   Zenovia Jarred, MSOT, OTR/L Behavioral Health OT/ Acute Relief OT PHP Office: [phone]  Zenovia Jarred 06/02/2019, 11:20 AM  [place], [place], [address] Phone: [phone]   Fax:  [phone]  Name: Sarah Cardenas MRN: [id number] Date of Birth: 1986/08/28

## 2019-06-02 NOTE — Progress Notes (Signed)
Spoke with patient via Webex video call, used 2 identifiers to correctly identify patient. Appropriate mood and affect, staes she has had lots of anxiety since getting 2 new diagnosis from [facility]. Was recently diagnosed with Borderline Personality disorder and PTSD. Having panic attacks more frequently like every other day. Vistaril increased to TID. On scale 1-10 as 10 being worst she rates depression at 2/3 and anxiety at 5. She is getting out of the house more and spending time with cousins. Denies SI/HI or AV hallucinations. Has an appointment with [facility] on the 25th of this month but is trying to get one sooner because she is almost out of Vraylar.  No issues or complaints and group is going well for her.

## 2019-06-03 ENCOUNTER — Other Ambulatory Visit (HOSPITAL_COMMUNITY): Payer: No Typology Code available for payment source | Admitting: Licensed Clinical Social Worker

## 2019-06-03 ENCOUNTER — Other Ambulatory Visit: Payer: Self-pay

## 2019-06-03 ENCOUNTER — Other Ambulatory Visit (HOSPITAL_COMMUNITY): Payer: No Typology Code available for payment source | Admitting: Occupational Therapy

## 2019-06-03 ENCOUNTER — Encounter (HOSPITAL_COMMUNITY): Payer: Self-pay | Admitting: Family

## 2019-06-03 DIAGNOSIS — F3130 Bipolar disorder, current episode depressed, mild or moderate severity, unspecified: Secondary | ICD-10-CM

## 2019-06-03 DIAGNOSIS — R4589 Other symptoms and signs involving emotional state: Secondary | ICD-10-CM

## 2019-06-03 MED ORDER — QUETIAPINE FUMARATE 25 MG PO TABS: 25.0000 mg | ORAL_TABLET | Freq: Two times a day (BID) | ORAL | 0 refills | Status: DC

## 2019-06-03 NOTE — Progress Notes (Signed)
06/02/2019 11:00-12:15 Group met via Web-ex due to COVID-19 precautions  Pt attended spirituality group facilitated by Simone Curia, MDiv [facility]   Group focused on topic of "self-care."  Patients engaged in facilitated dialog around their perception of topic.  Utilized list of quotes to identify images of self care which which they connected and an image of self care which did not connect.  Engaged in facilitated conversation around their chosen images of self care, influences on self care, and current practices of self care in their life.

## 2019-06-03 NOTE — Progress Notes (Signed)
Virtual Visit via Telephone Note  I connected with Sarah Cardenas on 06/03/19 at  9:00 AM EDT by telephone and verified that I am speaking with the correct person using two identifiers.   I discussed the limitations, risks, security and privacy concerns of performing an evaluation and management service by telephone and the availability of in person appointments. I also discussed with the patient that there may be a patient responsible charge related to this service. The patient expressed understanding and agreed to proceed.   I discussed the assessment and treatment plan with the patient. The patient was provided an opportunity to ask questions and all were answered. The patient agreed with the plan and demonstrated an understanding of the instructions.   The patient was advised to call back or seek an in-person evaluation if the symptoms worsen or if the condition fails to improve as anticipated.  I provided 15 minutes of non-face-to-face time during this encounter.   Sarah Rackanika N Averil Digman, NP   BH MD/PA/NP OP Progress Note  06/03/2019 8:22 AM Sarah Cardenas  MRN:  [id number]   Evaluation: Follow-up regarding concerns of worsening anxiety and medication samples.  Patient reports worsening anxiety as was discussed on last week to titrate Vistaril from 25 mg to 50 mg p.o. 3 times daily as needed.  However patient reported " the insert to the vistaril medication should not be taking for longer than 4 weeks, and I have been on this medication for the past 2 years."  Education was provided with medication profile, however patient continues to present with apprehension.  Will initiate Seroquel 25 mg p.o. twice daily for mood stabilization.   Medical assistance provided Vraylar savings program coupon.  Will follow-up with preauthorization for medicaitons. Patient reported she [facility]. And would like to continue this medication.  Patient to continue partial  hospitalization programming.   Visit Diagnosis: No diagnosis found.  Past Psychiatric History:  Past Medical History:  Past Medical History:  Diagnosis Date  . Anxiety   . Bipolar affect, depressed (H[facility])   . Depression     Past Surgical History:  Procedure Laterality Date  . BREAST SURGERY     lump removed  . ear drum replacement     right  . FRACTURE SURGERY      Family Psychiatric History:   Family History:  Family History  Problem Relation Age of Onset  . Bipolar disorder Father   . Drug abuse Father   . Breast cancer Neg Hx   . Colon cancer Neg Hx     Social History:  Social History   Socioeconomic History  . Marital status: Single    Spouse name: Not on file  . Number of children: Not on file  . Years of education: Not on file  . Highest education level: Not on file  Occupational History  . Not on file  Social Needs  . Financial resource strain: Not on file  . Food insecurity    Worry: Never true    Inability: Never true  . Transportation needs    Medical: No    Non-medical: No  Tobacco Use  . Smoking status: Current Some Day Smoker    Packs/day: 0.50    Years: 10.00    Pack years: 5.00    Types: Cigarettes  . Smokeless tobacco: Never Used  Substance and Sexual Activity  . Alcohol use: Yes    Comment: occ  . Drug use: No  .  Sexual activity: Not Currently    Birth control/protection: Pill  Lifestyle  . Physical activity    Days per week: 0 days    Minutes per session: 0 min  . Stress: Rather much  Relationships  . Social connections    Talks on phone: More than three times a week    Gets together: Once a week    Attends religious service: More than 4 times per year    Active member of club or organization: No    Attends meetings of clubs or organizations: Never    Relationship status: Never married  Other Topics Concern  . Not on file  Social History Narrative   HR x 6 years   Single, no children    Allergies:  Allergies   Allergen Reactions  . Ciprofloxacin Other (See Comments)    numbness    Metabolic Disorder Labs: No results found for: HGBA1C, MPG No results found for: PROLACTIN Lab Results  Component Value Date   CHOL 226 (H) 12/07/2018   TRIG 229.0 (H) 12/07/2018   HDL 52.60 12/07/2018   CHOLHDL 4 12/07/2018   VLDL 45.8 (H) 12/07/2018   Lab Results  Component Value Date   TSH 2.24 12/07/2018    Therapeutic Level Labs: No results found for: LITHIUM No results found for: VALPROATE No components found for:  CBMZ  Current Medications: Current Outpatient Medications  Medication Sig Dispense Refill  . azelastine (ASTELIN) 0.1 % nasal spray Place 2 sprays into both nostrils 2 (two) times daily. Use in each nostril as directed (Patient not taking: Reported on 05/28/2019) 30 mL 12  . benzonatate (TESSALON) 200 MG capsule Take 1 capsule (200 mg total) by mouth 2 (two) times daily as needed for cough. (Patient not taking: Reported on 05/28/2019) 20 capsule 0  . cariprazine (VRAYLAR) capsule Take 1.5 mg by mouth daily.    Marland Kitchen. doxycycline (VIBRA-TABS) 100 MG tablet Take 1 tablet (100 mg total) by mouth 2 (two) times daily. (Patient not taking: Reported on 05/28/2019) 14 tablet 0  . hydrOXYzine (ATARAX/VISTARIL) 25 MG tablet Take 25 mg by mouth 2 (two) times daily at 8 am and 10 pm.     . ibuprofen (ADVIL,MOTRIN) 200 MG tablet Take 400 mg by mouth every 6 (six) hours as needed.    Marland Kitchen. ketorolac (TORADOL) 10 MG tablet Take 1 tablet (10 mg total) by mouth every 6 (six) hours as needed. (Patient not taking: Reported on 05/28/2019) 20 tablet 0  . lamoTRIgine (LAMICTAL) 150 MG tablet Take 150 mg by mouth 2 (two) times daily.    Marland Kitchen. lurasidone (LATUDA) 80 MG TABS tablet Take 80 mg by mouth daily with breakfast.    . norgestimate-ethinyl estradiol (ESTARYLLA) 0.25-35 MG-MCG tablet Estarylla 0.25 mg-35 mcg tablet    . prazosin (MINIPRESS) 2 MG capsule TK 2 CS PO QHS    . sertraline (ZOLOFT) 100 MG tablet TK 1 T PO QAM     . tiZANidine (ZANAFLEX) 2 MG tablet Take 1 tablet (2 mg total) by mouth every 8 (eight) hours as needed for muscle spasms. (Patient not taking: Reported on 05/28/2019) 12 tablet 0  . traZODone (DESYREL) 50 MG tablet Take 50 mg by mouth at bedtime as needed for sleep.    . VENTOLIN HFA 108 (90 Base) MCG/ACT inhaler INL 2 PFS ITL Q 4 H FOR UP TO 10 DAYS PRF WHZ OR SOB     No current facility-administered medications for this visit.      Musculoskeletal:  Psychiatric Specialty Exam: ROS  There were no vitals taken for this visit.There is no height or weight on file to calculate BMI.  General Appearance: NA  Eye Contact:  NA  Speech:  Clear and Coherent  Volume:  Normal  Mood:  Anxious and Depressed  Affect:  Congruent  Thought Process:  Coherent  Orientation:  Full (Time, Place, and Person)  Thought Content: Logical   Suicidal Thoughts:  No  Homicidal Thoughts:  No  Memory:  Immediate;   Fair Recent;   Fair  Judgement:  Fair  Insight:  Fair  Psychomotor Activity:  Normal  Concentration:  Concentration: Fair  Recall:  [employer] of Knowledge: Fair  Language: Fair  Akathisia:  No  Handed:  Right  AIMS (if indicated):   Assets:  Communication Skills Desire for Improvement Resilience Social Support  ADL's:  Intact  Cognition: WNL  Sleep:  NA   Screenings: PHQ2-9     Counselor from 05/28/2019 in [place] Office Visit from 12/07/2018 in [place]  PHQ-2 Total Score  2  2  PHQ-9 Total Score  11  8       Assessment and Plan:  Continue partial hospitalization programming Will make Seroquel 25 mg p.o. twice daily available for mood stabilization -Pending preauthorization with Vraylar ? Will discontinue if unable to afford this mediation   Derrill [place], NP 06/03/2019, 8:22 AM

## 2019-06-04 ENCOUNTER — Other Ambulatory Visit: Payer: Self-pay

## 2019-06-04 ENCOUNTER — Other Ambulatory Visit (HOSPITAL_COMMUNITY): Payer: No Typology Code available for payment source | Admitting: Occupational Therapy

## 2019-06-04 ENCOUNTER — Other Ambulatory Visit (HOSPITAL_COMMUNITY): Payer: No Typology Code available for payment source | Admitting: Licensed Clinical Social Worker

## 2019-06-04 DIAGNOSIS — R4589 Other symptoms and signs involving emotional state: Secondary | ICD-10-CM

## 2019-06-04 DIAGNOSIS — F3130 Bipolar disorder, current episode depressed, mild or moderate severity, unspecified: Secondary | ICD-10-CM

## 2019-06-06 ENCOUNTER — Encounter (HOSPITAL_COMMUNITY): Payer: Self-pay | Admitting: Occupational Therapy

## 2019-06-06 NOTE — Therapy (Signed)
[facility] PART[facility] HOSPITAL[facility] PROGRAM [address] [place], [place], [address] Phone: [phone]   Fax:  [phone]  Occupational Therapy Treatment  Patient Details  Name: Evangeline Dakinmber Nicole Drozdowski MRN: [id number] Date of Birth: 02-Apr-1986 Referring Provider (OT): Hillery Jacksanika Lewis, NP  Virtual Visit via Video Note  I connected with Evangeline DakinAmber Nicole Odom on 06/06/19 at  8:00 AM EDT by a video enabled telemedicine application and verified that I am speaking with the correct person using two identifiers.   I discussed the limitations of evaluation and management by telemedicine and the availability of in person appointments. The patient expressed understanding and agreed to proceed.   I discussed the assessment and treatment plan with the patient. The patient was provided an opportunity to ask questions and all were answered. The patient agreed with the plan and demonstrated an understanding of the instructions.   The patient was advised to call back or seek an in-person evaluation if the symptoms worsen or if the condition fails to improve as anticipated.  I provided 60 minutes of non-face-to-face time during this encounter.  Dalphine HandingKaylee Caron Tardif, MSOT, OTR/L Behavioral Health OT/ Acute Relief OT PHP Office: [phone]  Dalphine HandingKaylee Oluwadarasimi [place], [place]    Encounter Date: 06/04/2019  OT End of Session - 06/06/19 2010    Visit Number  4    Number of Visits  12    Date for OT Re-Evaluation  06/27/19    Authorization Type  [employer]    OT Start Time  1100    OT Stop Time  1200    OT Time Calculation (min)  60 min    Activity Tolerance  Patient tolerated treatment well    Behavior During Therapy  WFL for tasks assessed/performed       Past Medical History:  Diagnosis Date  . Anxiety   . Bipolar affect, depressed ([facility])   . Depression     Past Surgical History:  Procedure Laterality Date  . BREAST SURGERY     lump removed  . ear drum replacement     right  . FRACTURE  SURGERY      There were no vitals filed for this visit.  Subjective Assessment - 06/06/19 2010    Currently in Pain?  No/denies        S: I am going to start journaling again  O: Education given on stress management skills. Pt asked to share current unhealthy coping mechanisms, and how they react to stress. After education, pt asked to share new preferred coping skill as of this date.  A: Pt presents with blunted affect, states that she normally cries and suppresses her stress. As of this date she would like to start journaling to generate positive thoughts and process her stress  P: OT group will be x3 per week while pt in PHP               OT Education - 06/06/19 2010    Education Details  education given on self esteem    Person(s) Educated  Patient    Methods  Explanation;Handout    Comprehension  Verbalized understanding       OT Short Term Goals - 05/30/19 2056      OT SHORT TERM GOAL #1   Title  Pt will be educated on strategies to improve psychosocial skills and coping mechanisms needed to participate in all daily, work, and leisure activities    Time  4    Period  Weeks  Status  On-going    Target Date  06/27/19      OT SHORT TERM GOAL #2   Title  Pt will apply psychosocial skills and coping mechanisms to daily activities in order to function independently and reintegrate into community    Time  4    Period  Weeks    Status  On-going      OT SHORT TERM GOAL #3   Title  Pt will recall and/or apply 1-3 sleep hygiene strategies to improve BADL function prior to community reintegration    Time  4    Period  Weeks    Status  On-going      OT SHORT TERM GOAL #4   Title  Pt will engage in goal setting to improve functional BADL/IADL routine prior to reintegrating into community    Time  4    Period  Weeks    Status  On-going      OT SHORT TERM GOAL #5   Title  Pt will identify 1-3 workplace stress strategies to implement in rotuine prior to RTW     Time  4    Period  Weeks    Status  On-going               Plan - 06/06/19 2011    Occupational performance deficits (Please refer to evaluation for details):  ADL's;IADL's;Rest and Sleep;Work;Social Participation;Leisure    Body Structure / Function / Physical Skills  ADL;IADL    Psychosocial Skills  Coping Strategies;Routines and Behaviors;Interpersonal Interaction       Patient will benefit from skilled therapeutic intervention in order to improve the following deficits and impairments:   Body Structure / Function / Physical Skills: ADL, IADL   Psychosocial Skills: Coping Strategies, Routines and Behaviors, Interpersonal Interaction   Visit Diagnosis: 1. Bipolar I disorder, most recent episode depressed ([place])   2. Difficulty coping       Problem List Patient Active Problem List   Diagnosis Date Noted  . Bipolar 1 disorder, depressed, severe ([place]) 05/26/2019  . Cervical intraepithelial neoplasia grade 1 12/07/2018   Zenovia Jarred, MSOT, OTR/L Behavioral Health OT/ Acute Relief OT PHP Office: [phone]  Zenovia Jarred 06/06/2019, [place], [place], [address] Phone: [phone]   Fax:  [phone]  Name: Delrae Hagey MRN: [id number] Date of Birth: [birth year]

## 2019-06-06 NOTE — Therapy (Signed)
[facility] [address] [place], [place], [address] Phone: [phone]   Fax:  [phone]  Occupational Therapy Treatment  Patient Details  Name: Sarah Cardenas MRN: [id number] Date of Birth: 1986/07/24 Referring Provider (OT): Hillery Jacksanika Lewis, NP  Virtual Visit via Video Note  I connected with Sarah Cardenas on 06/06/19 at  8:00 AM EDT by a video enabled telemedicine application and verified that I am speaking with the correct person using two identifiers.   I discussed the limitations of evaluation and management by telemedicine and the availability of in person appointments. The patient expressed understanding and agreed to proceed.   I discussed the assessment and treatment plan with the patient. The patient was provided an opportunity to ask questions and all were answered. The patient agreed with the plan and demonstrated an understanding of the instructions.   The patient was advised to call back or seek an in-person evaluation if the symptoms worsen or if the condition fails to improve as anticipated.  I provided 60 minutes of non-face-to-face time during this encounter.  Dalphine HandingKaylee Kern Gingras, MSOT, OTR/L Behavioral Health OT/ Acute Relief OT PHP Office: [phone]  Dalphine HandingKaylee Rowene Suto, [place]    Encounter Date: 06/03/2019  OT End of Session - 06/06/19 1936    Visit Number  3    Number of Visits  12    Date for OT Re-Evaluation  06/27/19    Authorization Type  [employer]    OT Start Time  1100    OT Stop Time  1200    OT Time Calculation (min)  60 min    Activity Tolerance  Patient tolerated treatment well    Behavior During Therapy  [facility] for tasks assessed/performed       Past Medical History:  Diagnosis Date  . Anxiety   . Bipolar affect, depressed ([facility])   . Depression     Past Surgical History:  Procedure Laterality Date  . BREAST SURGERY     lump removed  . ear drum replacement     right  . FRACTURE  SURGERY      There were no vitals filed for this visit.  Subjective Assessment - 06/06/19 1936    Currently in Pain?  No/denies        S: My self esteem is rather low   O:Education given on self esteem and how it relates to daily experiences. Pt asked to give definition of self esteem, and current rating of self esteem. Positive and negative contributors of self esteem to be brainstormed within group in relation to personal experiences. Positive thinking activity then completed for pt to identify several positive traits about themselves. Pt asked to share at end of session.  A: Pt presents with flat affect, engaged and participatory throughout session. She shares how her self esteem is low through judgements, etc. She shares how getting her nails done helps to increase her self esteem, along with other forms of self care  P: OT Group [facility]                 OT Education - 06/06/19 1936    Education Details  education given on self esteem    Person(s) Educated  Patient    Methods  Explanation;Handout    Comprehension  Verbalized understanding       OT Short Term Goals - 05/30/19 2056      OT SHORT TERM GOAL #1  Title  Pt will be educated on strategies to improve psychosocial skills and coping mechanisms needed to participate in all daily, work, and leisure activities    Time  4    Period  Weeks    Status  On-going    Target Date  06/27/19      OT SHORT TERM GOAL #2   Title  Pt will apply psychosocial skills and coping mechanisms to daily activities in order to function independently and reintegrate into community    Time  4    Period  Weeks    Status  On-going      OT SHORT TERM GOAL #3   Title  Pt will recall and/or apply 1-3 sleep hygiene strategies to improve BADL function prior to community reintegration    Time  4    Period  Weeks    Status  On-going      OT SHORT TERM GOAL #4   Title  Pt will engage in goal setting to  improve functional BADL/IADL routine prior to reintegrating into community    Time  4    Period  Weeks    Status  On-going      OT SHORT TERM GOAL #5   Title  Pt will identify 1-3 workplace stress strategies to implement in rotuine prior to RTW    Time  4    Period  Weeks    Status  On-going               Plan - 06/06/19 1937    Occupational performance deficits (Please refer to evaluation for details):  ADL's;IADL's;Rest and Sleep;Work;Social Participation;Leisure    Body Structure / Function / Physical Skills  ADL;IADL    Psychosocial Skills  Coping Strategies;Routines and Behaviors;Interpersonal Interaction       Patient will benefit from skilled therapeutic intervention in order to improve the following deficits and impairments:   Body Structure / Function / Physical Skills: ADL, IADL   Psychosocial Skills: Coping Strategies, Routines and Behaviors, Interpersonal Interaction   Visit Diagnosis: 1. Bipolar I disorder, most recent episode depressed ([place])   2. Difficulty coping       Problem List Patient Active Problem List   Diagnosis Date Noted  . Bipolar 1 disorder, depressed, severe ([place]) 05/26/2019  . Cervical intraepithelial neoplasia grade 1 12/07/2018   Zenovia Jarred, MSOT, OTR/L Behavioral Health OT/ Acute Relief OT PHP Office: [phone]  Zenovia Jarred 06/06/2019, 7:39 PM  [facility] PARTIAL HOSPITALIZATION PROGRAM [place], [place], [address] Phone: [phone]   Fax:  [phone]  Name: Sarah Cardenas MRN: [id number] Date of Birth: 06-28-1986

## 2019-06-07 ENCOUNTER — Other Ambulatory Visit: Payer: Self-pay

## 2019-06-07 ENCOUNTER — Other Ambulatory Visit (HOSPITAL_COMMUNITY): Payer: No Typology Code available for payment source | Admitting: Licensed Clinical Social Worker

## 2019-06-07 DIAGNOSIS — F3130 Bipolar disorder, current episode depressed, mild or moderate severity, unspecified: Secondary | ICD-10-CM | POA: Diagnosis not present

## 2019-06-08 ENCOUNTER — Other Ambulatory Visit (HOSPITAL_COMMUNITY): Payer: No Typology Code available for payment source | Admitting: Licensed Clinical Social Worker

## 2019-06-08 ENCOUNTER — Telehealth (HOSPITAL_COMMUNITY): Payer: Self-pay | Admitting: Psychiatry

## 2019-06-08 ENCOUNTER — Other Ambulatory Visit (HOSPITAL_COMMUNITY): Payer: No Typology Code available for payment source | Admitting: Occupational Therapy

## 2019-06-08 ENCOUNTER — Other Ambulatory Visit: Payer: Self-pay

## 2019-06-08 DIAGNOSIS — F3130 Bipolar disorder, current episode depressed, mild or moderate severity, unspecified: Secondary | ICD-10-CM | POA: Diagnosis not present

## 2019-06-08 DIAGNOSIS — R4589 Other symptoms and signs involving emotional state: Secondary | ICD-10-CM

## 2019-06-08 NOTE — Telephone Encounter (Signed)
D:  Patient will transition to [place] on 06-15-19 from [place].  A:  Placed call to re-orient pt.  Answered all her questions.  Mailed forms for pt to complete.  Pt will start on 06-15-19 @ 9 a.m.  R:  Pt receptive.

## 2019-06-09 ENCOUNTER — Other Ambulatory Visit: Payer: Self-pay

## 2019-06-09 ENCOUNTER — Encounter (HOSPITAL_COMMUNITY): Payer: Self-pay | Admitting: Family

## 2019-06-09 ENCOUNTER — Encounter (HOSPITAL_COMMUNITY): Payer: Self-pay

## 2019-06-09 ENCOUNTER — Other Ambulatory Visit (HOSPITAL_COMMUNITY): Payer: No Typology Code available for payment source | Admitting: Licensed Clinical Social Worker

## 2019-06-09 DIAGNOSIS — F3130 Bipolar disorder, current episode depressed, mild or moderate severity, unspecified: Secondary | ICD-10-CM | POA: Diagnosis not present

## 2019-06-09 NOTE — Psych (Signed)
Virtual Visit via Video Note  I connected with Sarah Cardenas on 05/26/19 at  9:00 AM EDT by a video enabled telemedicine application and verified that I am speaking with the correct person using two identifiers.   I discussed the limitations of evaluation and management by telemedicine and the availability of in person appointments. The patient expressed understanding and agreed to proceed.  I discussed the assessment and treatment plan with the patient. The patient was provided an opportunity to ask questions and all were answered. The patient agreed with the plan and demonstrated an understanding of the instructions.   The patient was advised to call back or seek an in-person evaluation if the symptoms worsen or if the condition fails to improve as anticipated.  Pt was provided 240 minutes of non-face-to-face time during this encounter.   Donia GuilesJenny Anella Nakata, LCSW    [facility] THERAPIST PROGRESS NOTE  Sarah Cardenas [id number]  Session Time: 9:00 - 10:00  Participation Level: Active  Behavioral Response: CasualAlertDepressed  Type of Therapy: Group Therapy  Treatment Goals addressed: Coping  Interventions: CBT, DBT, Supportive and Reframing  Summary: Clinician led check-in regarding current stressors and situation, and review of patient completed daily inventory. Clinician utilized active listening and empathetic response and validated patient emotions. Clinician facilitated processing group on pertinent issues.   Therapist Response: Sarah Cardenas is a 33 y.o. female who presents with depression and anxiety symptoms. Patient arrived within time allowed and reports that she is feeling "anxious." Patient rates her mood at a 7 on a scale of 1-10 with 10 being great. Pt reports feeling nervous about group and being in this new situation, but is hopeful. Pt shares she had follow up appointments yesterday and then had dinner with her mom and went to bed early. Pt reports  struggles with sleep. Patient engaged in discussion.      Session Time: 10:00 - 11:00   Participation Level: Active   Behavioral Response: CasualAlertDepressed   Type of Therapy: Group Therapy, Psychotherapy   Treatment Goals addressed: Coping   Interventions: CBT, DBT, Solution focused, Supportive, Reframing   Summary: Cln led discussion on family relationships and how they affect us. Pt's shared ways in which they struggle with family dynamics and how they currently handle it. Group brainstormed ways in which they can improve these relationships.   Therapist Response: Patient engaged in discussion. Pt shares she has a good relationship with family now however it has not always been that way. Pt reports right now her mom is emotional and practical support for her and pt struggles with feeling indebted to her and like she can't set all the boundaries she wants to.        Session Time: 11:00 - 12:00   Participation Level: Active   Behavioral Response: CasualAlertDepressed   Type of Therapy: Group Therapy, Psychoeducation, Psychotherapy   Treatment Goals addressed: Coping   Interventions: CBT, DBT, Solution focused, Supportive, Reframing   Summary: Cln introduced topic of empathy. Cln defined empathy versus sympathy and offered it as a solution to negativity in relationships. Group discussed ways in which they have tried to connect with another person's perspective and how it affected their outlook.    Therapist Response: Pt states she identifies as an empathetic person, however states she does not think to specifically think about the other person's perspective when in the middle of things. Pt is able to share ways in which she has reframed situations in the past.  Session Time: 12:00- 1:00  Participation Level: Active  Behavioral Response: CasualAlertDepressed  Type of Therapy: Group Therapy  Treatment Goals addressed: Coping  Interventions: relaxation  training; Supportive; Reframing  Summary: 12:45 - 1:50: Relaxation group: Cln led group focused on retraining the body's response to stress.   1:50 -2:00 Clinician led check-out. Clinician assessed for immediate needs, medication compliance and efficacy, and safety concerns   Therapist Response: Patient engaged in activity and discussion. At [place], patient rates her mood at a 8 on a scale of 1-10 with 10 being great. Patient reports afternoon plans of picking up sample medication and taking a nap.  Patient demonstrates some progress as evidenced by participating in first group session. Patient denies SI/HI/self-harm thoughts at the end of group.     Suicidal/Homicidal: Nowithout intent/plan  Plan: Pt will continue in PHP while working to stabilize mood, decrease symptoms, and increase ability to manage symptoms in a healthy manner.   Diagnosis: Bipolar I disorder, most recent episode depressed ([place]) [F31.30]    1. Bipolar I disorder, most recent episode depressed ([place])       Lorin Glass, LCSW 06/09/2019

## 2019-06-09 NOTE — Progress Notes (Signed)
Virtual Visit via Video Note  I connected with Sarah Cardenas on 06/09/19 at  9:00 AM EDT by a video enabled telemedicine application and verified that I am speaking with the correct person using two identifiers.   I discussed the limitations of evaluation and management by telemedicine and the availability of in person appointments. The patient expressed understanding and agreed to proceed.  I discussed the assessment and treatment plan with the patient. The patient was provided an opportunity to ask questions and all were answered. The patient agreed with the plan and demonstrated an understanding of the instructions.   The patient was advised to call back or seek an in-person evaluation if the symptoms worsen or if the condition fails to improve as anticipated.  I provided 00 minutes of non-face-to-face time during this encounter.   Derrill Center, NP   [facility] Partial hospitalization outpatient Program Discharge Summary  Sarah Cardenas [id number]  Admission date: 05/25/2019 Discharge date: 06/11/2019  Reason for admission: Per admission assessment noteAmber Cardenas  is a 33 y.o. Caucasian female presents with worsening depression and suicidal ideations .  Agape reported a recent discharge from [facility] for medication management mainly.  She reported feeling better since her discharge.  Reports a follow-up with her attending psychiatrist Landry Mellow.  Patient is requesting medication samples of Vraylar that was provided at [facility].reported concerns with affording medications however states that she has a follow-up appointment in 3 days.  Patient reports she continues to struggle with depression, anxiety poor concentration and intermittent suicidal ideations.  Reports previous inpatient admission.  Reports she has not been followed by psychiatrist in a while.  States she is currently prescribed reported history of substance abuse.  Currently denying  cravings or withdrawal symptoms.  Reported she recently stopped drinking x 1 week ago. Patient was enrolled in partial psychiatric program on 05/26/19.    Progress in Program Toward Treatment Goals: Ongoing,Sarah Cardenas seen via WebEx.  Presents pleasant with a bright affect during this assessment.  Reports mild improvement with depression.  She rates her depression 5 out of 10 with 10 being the worst.  She is currently denying suicidal or homicidal ideations.  Denies auditory or visual hallucinations.  Preauthorization was initiated for Vraylar early last week.  Patient reports working on coping skills to help improve her anxiety symptoms.  Reported infrequent episodes over the weekend.  Was encouraged to take Vistaril 50 mg p.o. PRN.  She reported mild relief with this medications.  Reports learning distraction techniques when she is feels an anxiety/panic attack coming on.  Patient to consider weekly therapy session.  Reports a follow-up with attending psychiatrist with [facility] on the 25th.  Reports a good appetite.  States she is resting well throughout the night. Support, encouragement and reassurance was provided.  Patient to transition to IOP on 06/15/2019  Progress (rationale): Stepping down to Intensive outpatient programming  (IOP) start on 06/15/2019 Keep all follow-up appointments and continue medications as directed   Take all medications as prescribed. Keep all follow-up appointments as scheduled.  Do not consume alcohol or use illegal drugs while on prescription medications. Report any adverse effects from your medications to your primary care provider promptly.  In the event of recurrent symptoms or worsening symptoms, call 911, a crisis hotline, or go to the nearest emergency department for evaluation.    Derrill Center, NP 06/09/2019

## 2019-06-09 NOTE — Progress Notes (Signed)
Spoke with patient via Webex video call, used 2 identifiers to correctly identify patient. Smiling with appropriate mood and affect. She states this week is her last week in [facility] and she starts IOP next Tuesday. She has really enjoyed groups and looks forward to IOP. Denies SI/HI or AV hallucinations. On scale 1-10 as 10 being worst she rates depression at 3 and anxiety at 4. PHQ9=5. Goes to [facility] for her medications on 8/25. Recently started Vraylar 1.5mg  and discontinued Latuda. No side effects and medications are working well for her. No issues or complaints.

## 2019-06-09 NOTE — Therapy (Signed)
[place], [place], [address] Phone: [phone]   Fax:  [phone]  Occupational Therapy Treatment  Patient Details  Name: Sarah Cardenas MRN: [id number] Date of Birth: [birth year] Referring Provider (OT): Ricky Ala, NP  Virtual Visit via Video Note  I connected with Sarah Cardenas on 06/09/19 at  8:00 AM EDT by a video enabled telemedicine application and verified that I am speaking with the correct person using two identifiers.   I discussed the limitations of evaluation and management by telemedicine and the availability of in person appointments. The patient expressed understanding and agreed to proceed.   I discussed the assessment and treatment plan with the patient. The patient was provided an opportunity to ask questions and all were answered. The patient agreed with the plan and demonstrated an understanding of the instructions.   The patient was advised to call back or seek an in-person evaluation if the symptoms worsen or if the condition fails to improve as anticipated.  I provided 60 minutes of non-face-to-face time during this encounter.   Zenovia Jarred, OT    Encounter Date: 06/08/2019  OT End of Session - 06/09/19 1209    Visit Number  5    Number of Visits  12    Date for OT Re-Evaluation  06/27/19    Authorization Type  [employer]    OT Start Time  1100    OT Stop Time  1200    OT Time Calculation (min)  60 min    Activity Tolerance  Patient tolerated treatment well    Behavior During Therapy  WFL for tasks assessed/performed       Past Medical History:  Diagnosis Date  . Anxiety   . Bipolar affect, depressed ([place])   . Depression     Past Surgical History:  Procedure Laterality Date  . BREAST SURGERY     lump removed  . ear drum replacement     right  . FRACTURE SURGERY      There were no vitals filed for this visit.  Subjective Assessment - 06/09/19 1208     Currently in Pain?  No/denies          S: This is applicable to a fight I just had with a friend   O: Education given on verbal communication skills this date. Fair fighting rules discussed in reference to a variety of relationships. Pt asked to apply certain rules to a current situation in life. Video clip of a relationship argument shown for pts to apply fair fighting rules and discuss with group."I statements" worksheet given to provide education on appropriate communication styles when in emotional situations. Pt asked to apply personal example of I statement at end of session.   A: Pt presents with blunted affect, engaged and participatory throughout session. Pt shares how she recently had an argument with a friend. She shares how she could specifically implement fair fighting rules and fabricated an I statement that would have made the argument more productive. Pt shares how she communicates well in her job, but is trying to apply it further to other areas of her daily life.   P: OT group will be x3 per week while pt in PHP           OT Education - 06/09/19 1208    Education Details  education given on self esteem    Person(s) Educated  Patient    Methods  Explanation;Handout  Comprehension  Verbalized understanding       OT Short Term Goals - 05/30/19 2056      OT SHORT TERM GOAL #1   Title  Pt will be educated on strategies to improve psychosocial skills and coping mechanisms needed to participate in all daily, work, and leisure activities    Time  4    Period  Weeks    Status  On-going    Target Date  06/27/19      OT SHORT TERM GOAL #2   Title  Pt will apply psychosocial skills and coping mechanisms to daily activities in order to function independently and reintegrate into community    Time  4    Period  Weeks    Status  On-going      OT SHORT TERM GOAL #3   Title  Pt will recall and/or apply 1-3 sleep hygiene strategies to improve BADL function prior  to community reintegration    Time  4    Period  Weeks    Status  On-going      OT SHORT TERM GOAL #4   Title  Pt will engage in goal setting to improve functional BADL/IADL routine prior to reintegrating into community    Time  4    Period  Weeks    Status  On-going      OT SHORT TERM GOAL #5   Title  Pt will identify 1-3 workplace stress strategies to implement in rotuine prior to RTW    Time  4    Period  Weeks    Status  On-going               Plan - 06/09/19 1209    Occupational performance deficits (Please refer to evaluation for details):  ADL's;IADL's;Rest and Sleep;Work;Social Participation;Leisure    Body Structure / Function / Physical Skills  ADL;IADL    Psychosocial Skills  Coping Strategies;Routines and Behaviors;Interpersonal Interaction       Patient will benefit from skilled therapeutic intervention in order to improve the following deficits and impairments:   Body Structure / Function / Physical Skills: ADL, IADL   Psychosocial Skills: Coping Strategies, Routines and Behaviors, Interpersonal Interaction   Visit Diagnosis: 1. Bipolar I disorder, most recent episode depressed ([facility])   2. Difficulty coping       Problem List Patient Active Problem List   Diagnosis Date Noted  . Bipolar 1 disorder, depressed, severe ([facility]) 05/26/2019  . Cervical intraepithelial neoplasia grade 1 12/07/2018   [name], MSOT, OTR/L Behavioral Health OT/ Acute Relief OT PHP Office: [phone]  [name] 06/09/2019, 12:10 PM  [facility] [address] [place], [place], [address] Phone: [phone]   Fax:  [phone]  Name: Sarah Cardenas MRN: [id number] Date of Birth: 1986-04-07

## 2019-06-10 ENCOUNTER — Other Ambulatory Visit (HOSPITAL_COMMUNITY): Payer: No Typology Code available for payment source | Admitting: Licensed Clinical Social Worker

## 2019-06-10 ENCOUNTER — Other Ambulatory Visit: Payer: Self-pay

## 2019-06-10 ENCOUNTER — Other Ambulatory Visit (HOSPITAL_COMMUNITY): Payer: No Typology Code available for payment source | Admitting: Occupational Therapy

## 2019-06-10 ENCOUNTER — Encounter (HOSPITAL_COMMUNITY): Payer: Self-pay

## 2019-06-10 DIAGNOSIS — F3130 Bipolar disorder, current episode depressed, mild or moderate severity, unspecified: Secondary | ICD-10-CM | POA: Diagnosis not present

## 2019-06-10 DIAGNOSIS — R4589 Other symptoms and signs involving emotional state: Secondary | ICD-10-CM

## 2019-06-10 NOTE — Psych (Signed)
Virtual Visit via Video Note  I connected with Sarah Cardenas on 05/27/19 at  9:00 AM EDT by a video enabled telemedicine application and verified that I am speaking with the correct person using two identifiers.   I discussed the limitations of evaluation and management by telemedicine and the availability of in person appointments. The patient expressed understanding and agreed to proceed.  I discussed the assessment and treatment plan with the patient. The patient was provided an opportunity to ask questions and all were answered. The patient agreed with the plan and demonstrated an understanding of the instructions.   The patient was advised to call back or seek an in-person evaluation if the symptoms worsen or if the condition fails to improve as anticipated.  Pt was provided 240 minutes of non-face-to-face time during this encounter.   Donia GuilesJenny Ranny Wiebelhaus, LCSW    [facility] THERAPIST PROGRESS NOTE  Sarah Cardenas [id number]  Session Time: 9:00 - 10:00  Participation Level: Active  Behavioral Response: CasualAlertDepressed  Type of Therapy: Group Therapy  Treatment Goals addressed: Coping  Interventions: CBT, DBT, Supportive and Reframing  Summary: Clinician led check-in regarding current stressors and situation, and review of patient completed daily inventory. Clinician utilized active listening and empathetic response and validated patient emotions. Clinician facilitated processing group on pertinent issues.   Therapist Response: Sarah Cardenas is a 33 y.o. female who presents with depression and anxiety symptoms. Patient arrived within time allowed and reports that she is feeling "tired." Patient rates her mood at a 5.5 on a scale of 1-10 with 10 being great. Pt reports she didn't sleep well last night and tossed and turned due to racing thoughts. Pt states her afternoon went well however she was "mentally exhausted" after group. Pt reports a co-worker called to check  in on her and was very supportive which pt was encouraged by. Pt struggles with wanting her medication to be a quick answer to her mental health issues. Pt able to process.  Patient engaged in discussion.      Session Time: 10:00 - 11:00   Participation Level: Active   Behavioral Response: CasualAlertDepressed   Type of Therapy: Group Therapy, Psychotherapy   Treatment Goals addressed: Coping   Interventions: CBT, DBT, Solution focused, Supportive, Reframing   Summary: Cln led discussion on personalization. Cln provided education on common pitfalls in personalizing other people's behaviors and how it can negatively impact us. Group members shared ways in which they can see this play out in their lives and processed issues that may be connected to personalization.    Therapist Response: Patient engaged in discussion. Pt reports she relates to examples of personalization and "I know I do it, but I can't think of specific issues with it." Pt provides support to other group members.       Session Time: 11:00 - 12:00   Participation Level: Active   Behavioral Response: CasualAlertDepressed   Type of Therapy: Group Therapy, Psychoeducation, Psychotherapy   Treatment Goals addressed: Coping   Interventions: CBT, DBT, Solution focused, Supportive, Reframing   Summary: Cln led DBT House activity. Group members identified feelings and things they want more of and how to achieve that as well as protections, supports, and values they have in their lives. Group discussed how they could use this activity to improve goal directed behaviors and gratitudes.    Therapist Response: Pt participated in discussion and activity. Pt identified wanting to increase feelings of happiness and calm.  Session Time: 12:00- 1:00   Participation Level: Active   Behavioral Response: CasualAlertDepressed   Type of Therapy: Group Therapy, OT   Treatment Goals addressed: Coping    Interventions: Psychosocial skills training, Supportive    Summary:  12:00 - 12:50 Occupational Therapy group 12:50 -1:00 Clinician led check-out. Clinician assessed for immediate needs, medication compliance and efficacy, and safety concerns   Therapist Response: Patient engaged in group. See OT note. At [place], patient rates her mood at a 5 on a scale of 1-10 with 10 being great. Patient reports afternoon plans of visiting her baby cousin. Patient demonstrates some progress as evidenced by increased connection in group. Patient denies SI/HI/self-harm thoughts at the end of group.     Suicidal/Homicidal: Nowithout intent/plan  Plan: Pt will continue in PHP while working to stabilize mood, decrease symptoms, and increase ability to manage symptoms in a healthy manner.   Diagnosis: Bipolar I disorder, most recent episode depressed ([place]) [F31.30]    1. Bipolar I disorder, most recent episode depressed ([place])       Lorin Glass, LCSW 06/10/2019

## 2019-06-10 NOTE — Therapy (Signed)
[facility] [address] [place], [place], [address] Phone: [phone]   Fax:  [phone]  Occupational Therapy Treatment  Patient Details  Name: Sarah Cardenas MRN: [id number] Date of Birth: [birth year] Referring Provider (OT): Hillery Jacksanika Lewis, NP  Virtual Visit via Video Note  I connected with Sarah Cardenas on 06/10/19 at  8:00 AM EDT by a video enabled telemedicine application and verified that I am speaking with the correct person using two identifiers.   I discussed the limitations of evaluation and management by telemedicine and the availability of in person appointments. The patient expressed understanding and agreed to proceed.   I discussed the assessment and treatment plan with the patient. The patient was provided an opportunity to ask questions and all were answered. The patient agreed with the plan and demonstrated an understanding of the instructions.   The patient was advised to call back or seek an in-person evaluation if the symptoms worsen or if the condition fails to improve as anticipated.  I provided 60 minutes of non-face-to-face time during this encounter.   Dalphine HandingKaylee Debarah Mccumbers, OT    Encounter Date: 06/10/2019  OT End of Session - 06/10/19 1657    Visit Number  6    Number of Visits  12    Date for OT Re-Evaluation  06/27/19    Authorization Type  [employer]    OT Start Time  1100    OT Stop Time  1200    OT Time Calculation (min)  60 min    Activity Tolerance  Patient tolerated treatment well    Behavior During Therapy  WFL for tasks assessed/performed       Past Medical History:  Diagnosis Date  . Anxiety   . Bipolar affect, depressed (H[facility])   . Depression     Past Surgical History:  Procedure Laterality Date  . BREAST SURGERY     lump removed  . ear drum replacement     right  . FRACTURE SURGERY      There were no vitals filed for this visit.  Subjective Assessment - 06/10/19 1657     Currently in Pain?  No/denies         S: I am definitely passive   O: Education and activities given in reference to increase assertiveness skills within daily life and relationships. Further education given on assertive conversation, situations, body language, and appropriate context for skill. Pt asked to identify one area to increase assertiveness this date. Further education given on the importance of assertiveness mentors and online research to continue skill building in this area increase quality of life.    A: Pt presents with blunted affect, engaged and participatory. She shares how she is primarily passive in most communication, especially when it comes to avoiding conflict. She shares how she would like to improve this in her work life and relationships. She shares she has chosen an assertiveness role model to help better application of skills.   P: OT Group will be x3 per week while pt in PHP           OT Education - 06/10/19 1657    Education Details  education given on assertiveness communication skills    Person(s) Educated  Patient    Methods  Explanation;Handout    Comprehension  Verbalized understanding       OT Short Term Goals - 05/30/19 2056      OT SHORT TERM GOAL #1   Title  Pt will be educated on strategies to improve psychosocial skills and coping mechanisms needed to participate in all daily, work, and leisure activities    Time  4    Period  Weeks    Status  On-going    Target Date  06/27/19      OT SHORT TERM GOAL #2   Title  Pt will apply psychosocial skills and coping mechanisms to daily activities in order to function independently and reintegrate into community    Time  4    Period  Weeks    Status  On-going      OT SHORT TERM GOAL #3   Title  Pt will recall and/or apply 1-3 sleep hygiene strategies to improve BADL function prior to community reintegration    Time  4    Period  Weeks    Status  On-going      OT SHORT TERM GOAL  #4   Title  Pt will engage in goal setting to improve functional BADL/IADL routine prior to reintegrating into community    Time  4    Period  Weeks    Status  On-going      OT SHORT TERM GOAL #5   Title  Pt will identify 1-3 workplace stress strategies to implement in rotuine prior to RTW    Time  4    Period  Weeks    Status  On-going               Plan - 06/10/19 1658    Occupational performance deficits (Please refer to evaluation for details):  ADL's;IADL's;Rest and Sleep;Work;Social Participation;Leisure    Body Structure / Function / Physical Skills  ADL;IADL    Psychosocial Skills  Coping Strategies;Routines and Behaviors;Interpersonal Interaction       Patient will benefit from skilled therapeutic intervention in order to improve the following deficits and impairments:   Body Structure / Function / Physical Skills: ADL, IADL   Psychosocial Skills: Coping Strategies, Routines and Behaviors, Interpersonal Interaction   Visit Diagnosis: Bipolar I disorder, most recent episode depressed ([place])  Difficulty coping    Problem List Patient Active Problem List   Diagnosis Date Noted  . Bipolar 1 disorder, depressed, severe ([place]) 05/26/2019  . Cervical intraepithelial neoplasia grade 1 12/07/2018   Zen[place]ia Jarred, MSOT, OTR/L Behavioral Health OT/ Acute Relief OT PHP Office: [phone]  Zenovia Jarred 06/10/2019, 4:58 PM  [facility] PARTIAL HOSPITALIZATION PROGRAM [place], [place], [address] Phone: [phone]   Fax:  [phone]  Name: Sarah Cardenas MRN: [id number] Date of Birth: 09/04/1986

## 2019-06-10 NOTE — Progress Notes (Addendum)
Pt attended spiritual care group 06/09/2019 11:00-12:00.  Group met via web-ex due to COVID-19 precautions.  Group facilitated by Simone Curia, MDiv, [place]   Group focused on topic of "community."  Members reflected on topic in facilitated dialog, identifying responses to topic and notions they hold of community from their previous experience.  Group members utilized value sort cards to identify top qualities they look for in community.  Engaged in facilitated dialog around their value choices, noting origin of these values, how these are realized in their lives, and strategies for engaging these values.    Spiritual care group drew on Motivational Interviewing, Narrative and Adlerian modalities.  Patient Progress:  Sarah Cardenas was present throughout group.  Alert and oriented, she actively engaged in discussion, reporting that this week had been a week of growth for her in community.  She is taking risks to share with others and has been surprised that the support she has received.  She described being vocal as an important element of community for her now - noting the dynamic of longing for the care she gives to others, but not having asked for it.  She identified the values of "Friendship, Helpfulness, Love, Self-acceptance."   Regarding self-acceptance, she notes that genuine community for her is rooted in an acceptance of herself so she can "be present" with and for others.  Identified this as an ongoing work.    Jerene Pitch, MDiv, [facility]

## 2019-06-10 NOTE — Psych (Signed)
Virtual Visit via Video Note  I connected with Sarah Cardenas on 05/28/19 at  9:00 AM EDT by a video enabled telemedicine application and verified that I am speaking with the correct person using two identifiers.   I discussed the limitations of evaluation and management by telemedicine and the availability of in person appointments. The patient expressed understanding and agreed to proceed.  I discussed the assessment and treatment plan with the patient. The patient was provided an opportunity to ask questions and all were answered. The patient agreed with the plan and demonstrated an understanding of the instructions.   The patient was advised to call back or seek an in-person evaluation if the symptoms worsen or if the condition fails to improve as anticipated.  Pt was provided 240 minutes of non-face-to-face time during this encounter.   Donia GuilesJenny Monte Bronder, LCSW    [facility] THERAPIST PROGRESS NOTE  Sarah Cardenas [id number]  Session Time: 9:00 - 10:00  Participation Level: Active  Behavioral Response: CasualAlertDepressed  Type of Therapy: Group Therapy  Treatment Goals addressed: Coping  Interventions: CBT, DBT, Supportive and Reframing  Summary: Clinician led check-in regarding current stressors and situation, and review of patient completed daily inventory. Clinician utilized active listening and empathetic response and validated patient emotions. Clinician facilitated processing group on pertinent issues.   Therapist Response: Sarah Cardenas is a 33 y.o. female who presents with depression and anxiety symptoms. Patient arrived within time allowed and reports that she is feeling "good." Patient rates her mood at a 8 on a scale of 1-10 with 10 being great. Pt reports she was in a bad mood yesterday and wasn't able to shake it. Pt states she played with her 753 y/o cousin for an hour and felt a little better but the improvement in mood ended after she left. Pt reports  spending the evening laying around and watching tv. Patient engaged in discussion.      Session Time: 10:00 -11:00  Participation Level: Active  Behavioral Response: CasualAlertAnxious  Type of Therapy: Group Therapy, psychoeducation, psychotherapy  Treatment Goals addressed: Coping  Interventions: CBT, DBT, Solution Focused, Supportive and Reframing  Summary:  Cln led discussion on trust and how to build new relationships. Group members discussed issues they have with trusting others and what level of support they currently have. Group discussed ways in which their habits may be counterproductive to building a healthy relationship.   Therapist Response: Patient participated in discussion and reports she struggles with trusting new people beyond surface level. Pt reports past trauma is a factor in her trust issues. Pt reports she is able to open up to some people, and when she doesn't she often feels misunderstood and unsupportive.       Session Time: 11:00- 12:00  Participation Level: Active  Behavioral Response: CasualAlertAnxious  Type of Therapy: Group Therapy, Psychoeducation; Psychotherapy  Treatment Goals addressed: Coping  Interventions: CBT; Solution focused; Supportive; Reframing  Summary:Clinician introduced topic of cognitive distortions. Cln educated on what cognitive distortions are and how they affect us. Cln introduced "Catch, Challenge, Change" model as a way to modify behaviors and how to utilize it.    Therapist Response: Pt participated in discussion and reports understanding of topic.       Session Time: 12:00 -1:00  Participation Level:Active  Behavioral Response:CasualAlertAnxious  Type of Therapy: Group Therapy, OT  Treatment Goals addressed: Coping  Interventions:Psychosocial skills training, Supportive,   Summary:12:00 - 12:50:Occupational Therapy group 12:50 -1:00 Clinician led check-out.  Clinician assessed for immediate needs, medication compliance and efficacy, and safety concerns   Therapist Response: 12:00 - 12:50: Patient engaged in group. See OT note.  12:50 - 1:00: At check-out, patient rates her mood at a 8 on a scale of 1-10 with 10 being great. Patient reports afternoon plans of going shopping and [place] trip with her mom. Patient demonstrates some progress as evidenced by recognizing ways to manage her mood. Patient denies SI/HI/self-harm at the end of group.     Suicidal/Homicidal: Nowithout intent/plan  Plan: Pt will continue in PHP while working to stabilize mood, decrease symptoms, and increase ability to manage symptoms in a healthy manner.   Diagnosis: Bipolar I disorder, most recent episode depressed ([place]) [F31.30]    1. Bipolar I disorder, most recent episode depressed ([place])       Lorin Glass, LCSW 06/10/2019

## 2019-06-11 ENCOUNTER — Other Ambulatory Visit: Payer: Self-pay

## 2019-06-11 ENCOUNTER — Other Ambulatory Visit (HOSPITAL_COMMUNITY): Payer: No Typology Code available for payment source | Admitting: Licensed Clinical Social Worker

## 2019-06-11 ENCOUNTER — Other Ambulatory Visit (HOSPITAL_COMMUNITY): Payer: No Typology Code available for payment source | Admitting: Occupational Therapy

## 2019-06-11 ENCOUNTER — Encounter (HOSPITAL_COMMUNITY): Payer: Self-pay | Admitting: Occupational Therapy

## 2019-06-11 DIAGNOSIS — F3130 Bipolar disorder, current episode depressed, mild or moderate severity, unspecified: Secondary | ICD-10-CM | POA: Diagnosis not present

## 2019-06-11 DIAGNOSIS — R4589 Other symptoms and signs involving emotional state: Secondary | ICD-10-CM

## 2019-06-11 NOTE — Therapy (Signed)
[place], [place], [address] Phone: [phone]   Fax:  [phone]  Occupational Therapy Treatment/Discharge  Patient Details  Name: Sarah Cardenas MRN: [id number] Date of Birth: [birth year] Referring Provider (OT): Ricky Ala, NP  Virtual Visit via Video Note  I connected with Sarah Cardenas on 06/11/19 at  8:00 AM EDT by a video enabled telemedicine application and verified that I am speaking with the correct person using two identifiers.   I discussed the limitations of evaluation and management by telemedicine and the availability of in person appointments. The patient expressed understanding and agreed to proceed.    I discussed the assessment and treatment plan with the patient. The patient was provided an opportunity to ask questions and all were answered. The patient agreed with the plan and demonstrated an understanding of the instructions.   The patient was advised to call back or seek an in-person evaluation if the symptoms worsen or if the condition fails to improve as anticipated.  I provided 60 minutes of non-face-to-face time during this encounter.   Sarah Cardenas, OT    Encounter Date: 06/11/2019  OT End of Session - 06/11/19 1619    Visit Number  7    Number of Visits  12    Date for OT Re-Evaluation  06/27/19    Authorization Type  [employer]    OT Start Time  1100    OT Stop Time  1200    OT Time Calculation (min)  60 min    Activity Tolerance  Patient tolerated treatment well    Behavior During Therapy  WFL for tasks assessed/performed       Past Medical History:  Diagnosis Date  . Anxiety   . Bipolar affect, depressed ([place])   . Depression     Past Surgical History:  Procedure Laterality Date  . BREAST SURGERY     lump removed  . ear drum replacement     right  . FRACTURE SURGERY      There were no vitals filed for this visit.  Subjective Assessment -  06/11/19 1618    Currently in Pain?  No/denies          S: I have a morning routine, and if it does not get done my whole day is thrown off   O: Education given on routine management and its importance in increasing functional BADL/IADL independence. Education given on how to build daily routines, with various tips of organization and time management included. Home maintaining, meal preparation, child/pet care, work life balance, and medication management all discussed. Pt asked to share personal experiences and one new skill they would like to implement form session.    A: Pt presents to group with blunted affect, engaged and participatory throughout session. Pt shares how her morning routine is appropriate to her. She would like to implement using to do lists after getting education this date. She shares that this will be helpful in daily life as well as RTW.  P: Anticipate d/c this date.              OT Education - 06/11/19 1618    Education Details  education given on routine building    Person(s) Educated  Patient    Methods  Explanation;Handout    Comprehension  Verbalized understanding       OT Short Term Goals - 06/11/19 1620      OT SHORT TERM GOAL #1  Title  Pt will be educated on strategies to improve psychosocial skills and coping mechanisms needed to participate in all daily, work, and leisure activities    Time  4    Period  Weeks    Status  Achieved    Target Date  06/27/19      OT SHORT TERM GOAL #2   Title  Pt will apply psychosocial skills and coping mechanisms to daily activities in order to function independently and reintegrate into community    Time  4    Period  Weeks    Status  Achieved      OT SHORT TERM GOAL #3   Title  Pt will recall and/or apply 1-3 sleep hygiene strategies to improve BADL function prior to community reintegration    Time  4    Period  Weeks    Status  Achieved      OT SHORT TERM GOAL #4   Title  Pt will engage in  goal setting to improve functional BADL/IADL routine prior to reintegrating into community    Time  4    Period  Weeks    Status  Achieved      OT SHORT TERM GOAL #5   Title  Pt will identify 1-3 workplace stress strategies to implement in routine prior to RTW    Time  4    Period  Weeks    Status  Achieved               Plan - 06/11/19 1620    Occupational performance deficits (Please refer to evaluation for details):  ADL's;IADL's;Rest and Sleep;Work;Social Participation;Leisure    Body Structure / Function / Physical Skills  ADL;IADL    Psychosocial Skills  Coping Strategies;Routines and Behaviors;Interpersonal Interaction       Patient will benefit from skilled therapeutic intervention in order to improve the following deficits and impairments:   Body Structure / Function / Physical Skills: ADL, IADL   Psychosocial Skills: Coping Strategies, Routines and Behaviors, Interpersonal Interaction   Visit Diagnosis: Bipolar I disorder, most recent episode depressed ([place])  Difficulty coping    Problem List Patient Active Problem List   Diagnosis Date Noted  . Bipolar 1 disorder, depressed, severe ([place]) 05/26/2019  . Cervical intraepithelial neoplasia grade 1 12/07/2018    OCCUPATIONAL THERAPY DISCHARGE SUMMARY  Visits from Start of Care: 7  Current functional level related to goals / functional outcomes: Stepping down to [place] IOP level of care   Remaining deficits: Continue to implement coping skills learned   Education / Equipment: Education given on psychosocial and coping skills as it applies to BADL/IADL routine for successful community reintegration  Plan: Patient agrees to discharge.  Patient goals were met. Patient is being discharged due to meeting the stated rehab goals.  ?????        Sarah Cardenas, MSOT, OTR/L Behavioral Health OT/ Acute Relief OT PHP Office: [place] 06/11/2019, [place] PARTIAL HOSPITALIZATION PROGRAM [place], [place], [address] Phone: [phone]   Fax:  [phone]  Name: Sarah Cardenas MRN: [id number] Date of Birth: Aug 09, 1986

## 2019-06-11 NOTE — Psych (Signed)
Virtual Visit via Video Note  I connected with Sarah Cardenas on 05/31/19 at  9:00 AM EDT by a video enabled telemedicine application and verified that I am speaking with the correct person using two identifiers.   I discussed the limitations of evaluation and management by telemedicine and the availability of in person appointments. The patient expressed understanding and agreed to proceed.  I discussed the assessment and treatment plan with the patient. The patient was provided an opportunity to ask questions and all were answered. The patient agreed with the plan and demonstrated an understanding of the instructions.   The patient was advised to call back or seek an in-person evaluation if the symptoms worsen or if the condition fails to improve as anticipated.  Pt was provided 240 minutes of non-face-to-face time during this encounter.   Donia GuilesJenny Lisseth Brazeau, LCSW    [facility] PHP THERAPIST PROGRESS NOTE  Sarah Cardenas [id number]  Session Time: 9:00 - 10:00  Participation Level: Active  Behavioral Response: CasualAlertDepressed  Type of Therapy: Group Therapy  Treatment Goals addressed: Coping  Interventions: CBT, DBT, Supportive and Reframing  Summary: Clinician led check-in regarding current stressors and situation, and review of patient completed daily inventory. Clinician utilized active listening and empathetic response and validated patient emotions. Clinician facilitated processing group on pertinent issues.   Therapist Response: Sarah Cardenas is a 33 y.o. female who presents with depression and anxiety symptoms. Patient arrived within time allowed and reports that she is feeling "good." Patient rates her mood at a 8 on a scale of 1-10 with 10 being great. Pt reports she [place] the [place] over the [place] and she and her mom got along well and pt read a book. Pt states wanting to get back into reading as a coping skill for herself. Pt states sleeping most  of the afternoon and through the night yesterday. Pt reports struggling with managing her anxiety. Pt able to process. Patient engaged in discussion.      Session Time: 10:00 -11:00  Participation Level: Active  Behavioral Response: CasualAlertDepressed  Type of Therapy: Group Therapy, psychoeducation, psychotherapy  Treatment Goals addressed: Coping  Interventions: CBT, DBT, Solution Focused, Supportive and Reframing  Summary:  Cln led discussion on distraction as a coping strategy. Cln educated on the reasons why distraction can be beneficial and how to use it successfully. Group discussed distractions they have used in the past and have/can implement currently.    Therapist Response: Patient participated in discussion and identifies sleeping and tv as what she mainly does know to distract, but wants to add reading and getting back into crafting.       Session Time: 11:00- 12:00  Participation Level: Active  Behavioral Response: CasualAlertAnxious  Type of Therapy: Group Therapy, Psychoeducation; Psychotherapy  Treatment Goals addressed: Coping  Interventions: CBT; DBT; Solution focused; Supportive; Reframing  Summary: Clinician led group on The Five Love Languages and how they can aid relationships. Group members discussed the importance of each language and what they look like. Cln discussed how looking at love languages can counteract unhealthy thought patterns and improve interpersonal relationships. Group members were given link to take the quiz should they desire.   Therapist Response: Pt participated in discussion and states this concept "really speaks to me." Pt reports desire to have her friends take the quiz with her so that she can be responsive to their needs.       Session Time: 12:00 -1:00  Participation Level:  Active  Behavioral Response: CasualAlertAnxious  Type of Therapy: Group Therapy, Psychoeducation;  Psychotherapy  Treatment Goals addressed: Coping  Interventions: CBT; DBT; Solution focused; Supportive; Reframing  Summary:12:00 - 12:50:Clinician continued topic of cognitive distortions. Cln began review of the types of cognitive distortions utilizing "Cognitive Distortions" handout and group members determined examples of how the distortions play out and effect their lives.   12:50 -1:00 Clinician led check-out. Clinician assessed for immediate needs, medication compliance and efficacy, and safety concerns   Therapist Response: 12:00 - 12:50: Patient engaged in discussion and identifies struggling with fortune telling and mind reading. 12:50 - 1:00: At check-out, patient rates her mood at a 8 on a scale of 1-10 with 10 being great. Patient reports afternoon plans of taking a shower and going to the craft store. Patient demonstrates some progress as evidenced by identifying new coping skills. Patient denies SI/HI/self-harm at the end of group.      Suicidal/Homicidal: Nowithout intent/plan  Plan: Pt will continue in PHP while working to stabilize mood, decrease symptoms, and increase ability to manage symptoms in a healthy manner.   Diagnosis: Bipolar I disorder, most recent episode depressed ([place]) [F31.30]    1. Bipolar I disorder, most recent episode depressed ([place])       Lorin Glass, LCSW 06/11/2019

## 2019-06-14 NOTE — Psych (Signed)
Virtual Visit via Video Note  I connected with Sarah Cardenas on 06/01/19 at  9:00 AM EDT by a video enabled telemedicine application and verified that I am speaking with the correct person using two identifiers.   I discussed the limitations of evaluation and management by telemedicine and the availability of in person appointments. The patient expressed understanding and agreed to proceed.  I discussed the assessment and treatment plan with the patient. The patient was provided an opportunity to ask questions and all were answered. The patient agreed with the plan and demonstrated an understanding of the instructions.   The patient was advised to call back or seek an in-person evaluation if the symptoms worsen or if the condition fails to improve as anticipated.  Pt was provided 240 minutes of non-face-to-face time during this encounter.   Sarah Glass, LCSW    [facility] PHP THERAPIST PROGRESS NOTE  Kealey Kemmer [id number]  Session Time: 9:00 - 10:00  Participation Level: Active  Behavioral Response: CasualAlertDepressed  Type of Therapy: Group Therapy  Treatment Goals addressed: Coping  Interventions: CBT, DBT, Supportive and Reframing  Summary: Clinician led check-in regarding current stressors and situation, and review of patient completed daily inventory. Clinician utilized active listening and empathetic response and validated patient emotions. Clinician facilitated processing group on pertinent issues.   Therapist Response: Sarah Cardenas is a 33 y.o. female who presents with depression and anxiety symptoms. Patient arrived within time allowed and reports that she is feeling "grumpy." Patient rates her mood at a 6 on a scale of 1-10 with 10 being great. Pt reports her day went well yesterday and she continues to use books and art as a distraction. Pt shares she reached out to her friend group and had them all share their love languages and she plans to be  responsive to them. Pt shares she woke up grumpy and thinks it is due to not sleeping well and having nightmares. Pt reports struggling with managing her negative moods. Pt able to process. Patient engaged in discussion.      Session Time: 10:00 -11:00  Participation Level: Active  Behavioral Response: CasualAlertDepressed  Type of Therapy: Group Therapy, psychoeducation, psychotherapy  Treatment Goals addressed: Coping  Interventions: CBT, DBT, Solution Focused, Supportive and Reframing  Summary:  Cln led discussion on the "emotion volcano" and how stress builds until an explosion point if not dealt with. Group members shared how they typically respond to stress. Group members brainstormed ways they could incorporate stress management strategies into their lives to decrease frequency of "explosions."   Therapist Response: Patient participated in discussion and reports she will emotionally retreat when overwhelmed or sleep.       Session Time: 11:00- 12:00  Participation Level: Active  Behavioral Response: CasualAlertDepressed  Type of Therapy: Group Therapy, Psychoeducation; Psychotherapy  Treatment Goals addressed: Coping  Interventions: CBT; Solution focused; Supportive; Reframing  Summary: Clinician continued topic of cognitive distortions. Cln continued to review of the types of cognitive distortions, working on "catch" and utilizing "Unhealthy Thought Patterns" handout. Group members determined examples of how the distortions play out and effect their lives.     Therapist Response: Pt participated in discussion and reports struggling with distortion "fallacy of fairness."    **Pt chose to leave group at 12, leaving abruptly stating that she was "upset." Cln called pt to f/u and pt denies SI/HI and states she will be in group tomorrow.    Suicidal/Homicidal: Nowithout intent/plan  Plan: Pt will continue  in PHP while working to stabilize mood,  decrease symptoms, and increase ability to manage symptoms in a healthy manner.   Diagnosis: Bipolar I disorder, most recent episode depressed (HCC) [F31.30]    1. Bipolar I disorder, most recent episode depressed (HCC)       Sarah GuilesJenny Ariana Juul, LCSW 06/14/2019

## 2019-06-15 ENCOUNTER — Other Ambulatory Visit (HOSPITAL_COMMUNITY): Payer: No Typology Code available for payment source | Admitting: Psychiatry

## 2019-06-15 ENCOUNTER — Other Ambulatory Visit: Payer: Self-pay

## 2019-06-15 ENCOUNTER — Encounter (HOSPITAL_COMMUNITY): Payer: Self-pay | Admitting: Psychiatry

## 2019-06-15 DIAGNOSIS — F3130 Bipolar disorder, current episode depressed, mild or moderate severity, unspecified: Secondary | ICD-10-CM

## 2019-06-15 NOTE — Progress Notes (Signed)
Virtual Visit via Video Note  I connected with Sarah Cardenas on 06/15/19 at  9:00 AM EDT by a video enabled telemedicine application and verified that I am speaking with the correct person using two identifiers.  I discussed the limitations of evaluation and management by telemedicine and the availability of in person appointments. The patient expressed understanding and agreed to proceed.  I discussed the assessment and treatment plan with the patient. The patient was provided an opportunity to ask questions and all were answered. The patient agreed with the plan and demonstrated an understanding of the instructions.  The patient was advised to call back or seek an in-person evaluation if the symptoms worsen or if the condition fails to improve as anticipated.  I provided 40 minutes of non-face-to-face time during this encounter.  As per previous CCA states: Pt reports to PHP per followup from inpt at [facility]. Pt was inpt due to suicide attempt by drinking and hanging. Pt reports she was inpt for 6 days. Pt reports 2 inpt stays prior: [facility] in Oct. 2018 for med management; when pt was 13 at [facility] for suicide attempt by cutting. Pt reports she was seeing Kandra Nicolas for 6-7 years for counseling. Pt recently decided to switch therapists "because I got everything I could from her. I needed a new ear to listen." Pt started seeing Patty Hickman about 4 weeks ago. Pt reports she saw Jobie Quaker at [facility] for 6-7 years but wants to switch psychiatrists at this time. Pt reports her depression had become increasingly worse for 6 weeks prior to attempt and had symptoms including lack of motivation, anhedonia, and decreased ADLS- not showering for weeks, not leaving the couch, not cooking or cleaning. 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 Pt reports she was drinking 1 bottle of wine 2-3x a week prior to inpt stay. Pt has committed to not drinking and feels it is not a primary focus of her mental health treatment because she knows she was using it as a coping mechanism. Pt denies any withdrawal symptoms or cravings; reports she hasn't had anything to drink since attempt on 7/24. Pt reports continued passive SI, denies intent/plan. Pt denies HI/AVH. Patients Currently Reported Symptoms/Problems: Pt reports recent 6 day inpt stay for suicide attempt by hanging; increased depression and anxiety; continued passive SI; lack of motivation; decreased ADLS- showering 1x a week at max, not cooking or cleaning, pt reports rarely getting off the couch; lack of energy; anhedonia; feelings of hopelessness/worthlessness; decreased sleep- can't stay asleep; mood swings; irritability; excessive worrying; feelings of loneliness; work problems- suspension from work; poor concentration  Pt transitioned to [place] today.  States she will need to log off to meet with her psychiatrist (which was previously scheduled). Pt was in [place] previously with writer (December 2018). Pt denies SI/HI or A/V hallucinations today.  A:  Re-oriented pt.  Pt gave verbal consent for treatment, to release chart information to referred providers and to complete any forms if needed.  Pt also gave consent for attending group virtually d/t COVID-19  social distancing restrictions.  Encouraged support groups.  F/U with [facility] and Patty Hickman, LPC.  R:  Pt receptive.  Jeri Modenaita Suan Pyeatt, M.Ed,CNA

## 2019-06-16 ENCOUNTER — Encounter (HOSPITAL_COMMUNITY): Payer: Self-pay | Admitting: Psychiatry

## 2019-06-16 ENCOUNTER — Other Ambulatory Visit: Payer: Self-pay

## 2019-06-16 ENCOUNTER — Other Ambulatory Visit (HOSPITAL_COMMUNITY): Payer: No Typology Code available for payment source | Admitting: Psychiatry

## 2019-06-16 DIAGNOSIS — F3130 Bipolar disorder, current episode depressed, mild or moderate severity, unspecified: Secondary | ICD-10-CM

## 2019-06-16 NOTE — Psych (Signed)
Virtual Visit via Video Note  I connected with Sarah Cardenas on 06/03/19 at  9:00 AM EDT by a video enabled telemedicine application and verified that I am speaking with the correct person using two identifiers.   I discussed the limitations of evaluation and management by telemedicine and the availability of in person appointments. The patient expressed understanding and agreed to proceed.  I discussed the assessment and treatment plan with the patient. The patient was provided an opportunity to ask questions and all were answered. The patient agreed with the plan and demonstrated an understanding of the instructions.   The patient was advised to call back or seek an in-person evaluation if the symptoms worsen or if the condition fails to improve as anticipated.  Pt was provided 240 minutes of non-face-to-face time during this encounter.   Donia GuilesJenny Selam Pietsch, LCSW    [facility] THERAPIST PROGRESS [facility]  Sarah Cardenas [id number]  Session Time: 9:00 - 10:00  Participation Level: Active  Behavioral Response: CasualAlertDepressed  Type of Therapy: Group Therapy  Treatment Goals addressed: Coping  Interventions: CBT, DBT, Supportive and Reframing  Summary: Clinician led check-in regarding current stressors and situation, and review of patient completed daily inventory. Clinician utilized active listening and empathetic response and validated patient emotions. Clinician facilitated processing group on pertinent issues.   Therapist Response: Sarah Cardenas is a 33 y.o. female who presents with depression and anxiety symptoms. Patient arrived within time allowed and reports that she is feeling "okay." Patient rates her mood at a 7 on a scale of 1-10 with 10 being great. Pt reports she she did not sleep well and had "lots" of nightmares that interrupted her sleep and there was no known trigger for her nightmares. Pt states that she spent yesterday afternoon reading and visiting with  her cousin. Pt reports struggles with managing anxiety.  Pt able to process. Patient engaged in discussion.      Session Time: 10:00 -11:00  Participation Level: Active  Behavioral Response: CasualAlertDepressed  Type of Therapy: Group Therapy, psychoeducation, psychotherapy  Treatment Goals addressed: Coping  Interventions: CBT, DBT, Solution Focused, Supportive and Reframing  Summary: Cln facilitated discussion regarding group members' feelings regarding people in their life who do not understand mental health. Group members shared ways in which they feel misunderstood due to their mental health and how it bothers them.   Therapist Response: Patient participated in discussion and shares her support system is "fantastic" and she does often feel misunderstood in terms of her mental health because they do not have experience with it. Pt expresses interest in support groups as a way to manage this issue.      Session Time: 11:00- 12:00  Participation Level: Active  Behavioral Response: CasualAlertDepressed  Type of Therapy: Group Therapy, Psychoeducation; Psychotherapy  Treatment Goals addressed: Coping  Interventions: CBT; Solution focused; Supportive; Reframing  Summary: Cln introduced distress tolerance skills, reviewing their purpose and how to practice them. Cln introduced STOP skill and group discussed situations in which they could apply this skill.      Therapist Response: Pt reports understanding of skill and states that she could apply STOP when she is ruminating.       Session Time: 12:00 -1:00  Participation Level:Active  Behavioral Response:CasualAlertDepressed  Type of Therapy: Group Therapy, OT  Treatment Goals addressed: Coping  Interventions:Psychosocial skills training, Supportive,   Summary:12:00 - 12:50:Occupational Therapy group 12:50 -1:00 Clinician led check-out. Clinician assessed for immediate needs,  medication compliance and efficacy,  and safety concerns   Therapist Response: 12:00 - 12:50: Patient engaged in group. See OT note.  12:50 - 1:00: At check-out, patient rates her mood at a 8 on a scale of 1-10 with 10 being great. Pt states afternoon plans of  Hanging out with her baby cousin and resting. Patient demonstrates some progress as evidenced by continued work on building hobbies. Patient denies SI/HI/self-harm at the end of group.   Suicidal/Homicidal: Nowithout intent/plan  Plan: Pt will continue in PHP while working to stabilize mood, decrease symptoms, and increase ability to manage symptoms in a healthy manner.   Diagnosis: Bipolar I disorder, most recent episode depressed ([place]) [F31.30]    1. Bipolar I disorder, most recent episode depressed ([place])   2. Difficulty coping       Lorin Glass, LCSW 06/16/2019

## 2019-06-16 NOTE — Psych (Signed)
Virtual Visit via Video Note  I connected with Sarah Cardenas on 06/02/19 at  9:00 AM EDT by a video enabled telemedicine application and verified that I am speaking with the correct person using two identifiers.   I discussed the limitations of evaluation and management by telemedicine and the availability of in person appointments. The patient expressed understanding and agreed to proceed.  I discussed the assessment and treatment plan with the patient. The patient was provided an opportunity to ask questions and all were answered. The patient agreed with the plan and demonstrated an understanding of the instructions.   The patient was advised to call back or seek an in-person evaluation if the symptoms worsen or if the condition fails to improve as anticipated.  Pt was provided 240 minutes of non-face-to-face time during this encounter.   Sarah Glass, LCSW    [facility] PHP THERAPIST PROGRESS NOTE  Sarah Cardenas [id number]  Session Time: 9:00 - 10:00  Participation Level: Active  Behavioral Response: CasualAlertDepressed  Type of Therapy: Group Therapy  Treatment Goals addressed: Coping  Interventions: CBT, DBT, Supportive and Reframing  Summary: Clinician led check-in regarding current stressors and situation, and review of patient completed daily inventory. Clinician utilized active listening and empathetic response and validated patient emotions. Clinician facilitated processing group on pertinent issues.   Therapist Response: Sarah Cardenas is a 33 y.o. female who presents with depression and anxiety symptoms. Patient arrived within time allowed and reports that she is feeling "okay." Patient rates her mood at a 6 on a scale of 1-10 with 10 being great. Pt reports she had a "pretty bad day" yesterday. Pt shares she was given a new diagnosis by her doctor and is struggling with it. Pt shares her support system was helpful and kind "but I was just in a funk." Pt  shares she attempted to manage her mood by reading a book and her mom took her to dinner to "get me out of the house." Pt states she feels "clearer" today but is still on an "emotional roller coaster." Pt able to process. Patient engaged in discussion.      Session Time: 10:00-11:00  Participation Level:Active  Behavioral Response:CasualAlertDepressed  Type of Therapy: Group Therapy, psychoeducation, psychotherapy  Treatment Goals addressed: Coping  Interventions:CBT, DBT, Solution Focused, Supportive and Reframing  Summary:Cln led discussion on ways reality differs from the "social story" and how it creates conflict in our lives. Group shared ways their expectations have been holding them back due to these fallacies and how to begin to shift them in a healthier direction.   Therapist Response: Patient participated in discussion and reports tension in thinking that she should be married or more stable in her life by now.      Session Time: 11:00 -12:15  Participation Level: Active  Behavioral Response: CasualAlertDepressed  Type of Therapy: Group Therapy, psychotherapy  Treatment Goals addressed: Coping  Interventions: Strengths based, reframing, Supportive,   Summary:  Spiritual Care group  Therapist Response: Patient engaged in group. See chaplain note.        Session Time: 12:00 -1:00  Participation Level:Active  Behavioral Response:CasualAlertDepressed  Type of Therapy: Group Therapy, Psychoeducation; Psychotherapy  Treatment Goals addressed: Coping  Interventions:CBT; DBT;Solution focused; Supportive; Reframing  Summary:12:00 - 12:50:Clinician continued topic of cognitive distortions. Cln led review of previously discussed "catch" information. Group moved to "challenge" portion of behavior modification model and utilized handout "Socratic Questions" to challenge an identified distorted thought.  12:50 -1:00  Clinician  led check-out. Clinician assessed for immediate needs, medication compliance and efficacy, and safety concerns   Therapist Response:12:00 - 12:50:Pt reports understanding of how to challenge distorted thinking and successfully practices with group examples.  12:50 - 1:00: At check-out, patient [facility] a scale of 1-10 with 10 being great. Patient reportsafternoon plans ofreading.Patient demonstrates some progress as evidenced byusing skills even though she did not feel like it. Patient denies SI/HI/self-harm at the end of group.    Suicidal/Homicidal: Nowithout intent/plan  Plan: Pt will continue in PHP while working to stabilize mood, decrease symptoms, and increase ability to manage symptoms in a healthy manner.   Diagnosis: Bipolar I disorder, most recent episode depressed (HCC) [F31.30]    1. Bipolar I disorder, most recent episode depressed (HCC)       Sarah GuilesJenny Kemaya Dorner, LCSW 06/16/2019

## 2019-06-16 NOTE — Psych (Signed)
Virtual Visit via Video Note  I connected with Sarah DakinAmber Nicole Cardenas on 06/04/19 at  9:00 AM EDT by a video enabled telemedicine application and verified that I am speaking with the correct person using two identifiers.   I discussed the limitations of evaluation and management by telemedicine and the availability of in person appointments. The patient expressed understanding and agreed to proceed.  I discussed the assessment and treatment plan with the patient. The patient was provided an opportunity to ask questions and all were answered. The patient agreed with the plan and demonstrated an understanding of the instructions.   The patient was advised to call back or seek an in-person evaluation if the symptoms worsen or if the condition fails to improve as anticipated.  Pt was provided 240 minutes of non-face-to-face time during this encounter.   Sarah GuilesJenny Paschal Blanton, LCSW    [facility] PHP THERAPIST PROGRESS NOTE  Sarah Dakinmber Nicole Eaddy [id number]  Session Time: 9:00 - 10:00  Participation Level: Active  Behavioral Response: CasualAlertDepressed  Type of Therapy: Group Therapy  Treatment Goals addressed: Coping  Interventions: CBT, DBT, Supportive and Reframing  Summary: Clinician led check-in regarding current stressors and situation, and review of patient completed daily inventory. Clinician utilized active listening and empathetic response and validated patient emotions. Clinician facilitated processing group on pertinent issues.   Therapist Response: Sarah Cardenas is a 33 y.o. female who presents with depression and anxiety symptoms. Patient arrived within time allowed and reports that she is feeling "good." Patient rates her mood at a 8 on a scale of 1-10 with 10 being great. Pt reports she had a "really good" day yesterday and spent time with her baby cousin and finished a book. Pt states spending time with the children in her life improves her mood. Pt able to process. Patient engaged  in discussion.      Session Time: 10:00 -11:00  Participation Level: Active  Behavioral Response: CasualAlertDepressed  Type of Therapy: Group Therapy, psychoeducation, psychotherapy  Treatment Goals addressed: Coping  Interventions: CBT, DBT, Solution Focused, Supportive and Reframing  Summary: Cln led discussion on radical acceptance. Cln educated on what radical acceptance is and how to apply it. Group members shared ways in which radical acceptance could be helpful and struggles they have with utilizing it.  Therapist Response: Patient participated in discussion and reports radical acceptance can be helpful to her in regards to her mental health and where she is in life.      Session Time: 11:00- 12:00  Participation Level: Active  Behavioral Response: CasualAlertDepressed  Type of Therapy: Group Therapy, Psychoeducation; Psychotherapy  Treatment Goals addressed: Coping  Interventions: CBT; Solution focused; Supportive; Reframing  Summary: Clinician continued topic of distress tolerance skills and introduced TIPP skills. Group members discussed ways they could utilize the skills in their everyday life.       Therapist Response: Pt reports understanding of skill and states that she is most likely to use paced breathing.       Session Time: 12:00 -1:00  Participation Level:Active  Behavioral Response:CasualAlertDepressed  Type of Therapy: Group Therapy, OT  Treatment Goals addressed: Coping  Interventions:Psychosocial skills training, Supportive,   Summary:12:00 - 12:50:Occupational Therapy group 12:50 -1:00 Clinician led check-out. Clinician assessed for immediate needs, medication compliance and efficacy, and safety concerns   Therapist Response: 12:00 - 12:50: Patient engaged in group. See OT note.  12:50 - 1:00: At check-out, patient rates her mood at a 9 on a scale of 1-10 with 10 being  great. Pt states  afternoon plans of visiting her godson and doing chores. Patient demonstrates some progress as evidenced by purposefully seeking out activites that improve mood. Patient denies SI/HI/self-harm at the end of group.   Suicidal/Homicidal: Nowithout intent/plan  Plan: Pt will continue in PHP while working to stabilize mood, decrease symptoms, and increase ability to manage symptoms in a healthy manner.   Diagnosis: Bipolar I disorder, most recent episode depressed ([place]) [F31.30]    1. Bipolar I disorder, most recent episode depressed ([place])       Sarah Glass, LCSW 06/16/2019

## 2019-06-16 NOTE — Progress Notes (Signed)
Virtual Visit via Video Note  I connected with Sarah Cardenas on 06/15/19 at  9:00 AM EDT by a video enabled telemedicine application and verified that I am speaking with the correct person using two identifiers.  Location: Patient: Sarah Cardenas Provider: Lise Auer, LCSW   I discussed the limitations of evaluation and management by telemedicine and the availability of in person appointments. The patient expressed understanding and agreed to proceed.  History of Present Illness: Bipolar 1 DO   Observations/Objective: Case Manager checked in with all participants to review discharge dates, insurance authorizations, work-related documents and needs for the treatment team. Counselor processed current mood and functioning and discussed how participants spent their time since last session and if skills were applied. Sarah Cardenas stepped down to IOP from PHP and shared about her progress in treatment. She stated that she started back to work yesterday and despite her anxiety, it was a pleasant experience. Counselor engaged the group in an ice breaker to introduce each other to [name] since she was new to the group and as a coping skill. Counselor reviewed material on emotional intelligence and wrapped up with sharing about ways to improve emotional intelligence. Group members identified ways they could continue to improve in this area. Sarah Cardenas will go back and review materials from yesterday to assess her need areas. Counselor shared a brief video called, "The A-Z of Coping Skills" and reviewed a list of 31 coping skills more in-depth, challenging group members to make notes of ways they could use or apply each skill in their lives. Sarah Cardenas would like to focus on getting acupuncture and journal more. Counselor wrapped up group with allowing all to share their plans for the afternoon, self-care plan or productivity activity. Sarah Cardenas plans to attend her second day of work, relax and read a book.   Assessment and  Plan: Counselor recommends that patient remains in IOP treatment to better manage mental health symptoms and continue to address treatment plan goals. Counselor recommends adherence to crisis/safety plan, taking medications as prescribed and following up with medical professionals if any issues arise.   Follow Up Instructions: Counselor will send Webex link for next session.    I discussed the assessment and treatment plan with the patient. The patient was provided an opportunity to ask questions and all were answered. The patient agreed with the plan and demonstrated an understanding of the instructions.   The patient was advised to call back or seek an in-person evaluation if the symptoms worsen or if the condition fails to improve as anticipated.  I provided 180 minutes of non-face-to-face time during this encounter.   Lise Auer, LCSW

## 2019-06-16 NOTE — Progress Notes (Signed)
Virtual Visit via Telephone Note  I connected with Sarah Cardenas on 06/16/19 at  9:00 AM EDT by telephone and verified that I am speaking with the correct person using two identifiers.   I discussed the limitations, risks, security and privacy concerns of performing an evaluation and management service by telephone and the availability of in person appointments. I also discussed with the patient that there may be a patient responsible charge related to this service. The patient expressed understanding and agreed to proceed.   I discussed the assessment and treatment plan with the patient. The patient was provided an opportunity to ask questions and all were answered. The patient agreed with the plan and demonstrated an understanding of the instructions.   The patient was advised to call back or seek an in-person evaluation if the symptoms worsen or if the condition fails to improve as anticipated.  I provided 30 minutes of non-face-to-face time during this encounter.   Sarah Rackanika N Zoiey Christy, NP    Psychiatric Initial Adult Assessment   Patient Identification: Sarah Cardenas MRN:  [id number] Date of Evaluation:  06/16/2019 Referral Source: PHP Chief Complaint:  Suicidal ideations after a inpatient admission and recently completed Partical Hospitalization programing ( PHP)  Visit Diagnosis:    ICD-10-CM   1. Bipolar I disorder, most recent episode depressed (HCC)  F31.30     History of Present Illness:  Per admission assessment: Sarah Cardenas  is a 33 y.o. Caucasian female presents with worsening depression and suicidal ideations .  [name] reported a recent discharge from [facility] for medication management mainly.  She reported feeling better since her discharge.  Reports a follow-up with her attending psychiatrist Sarah Cardenas.  Patient is requesting medication samples [facility]reported concerns with affording medications however states that she  has a follow-up appointment in 3 days.  Patient reports she continues to struggle with depression, anxiety poor concentration and intermittent suicidal ideations.  Reports previous inpatient admission.  Reports she has not been followed by psychiatrist in a while.  States she is currently prescribed reported history of substance abuse.  Currently denying cravings or withdrawal symptoms.  Reported she recently stopped drinking x 1 week ago.   Evaluation on 06/16/2019: Sarah Cardenas reported that her anxiety has improved since starting intensive outpatient programming.  Reports a recent follow-up with her attending psychiatrist who initiated Seroquel for her anxiety.  She reports mild dizziness since starting medication.  She states that her psychiatrist recently adjusted it to a higher dose and she is now feeling a lot better taking it. [name] reported " my psychiatrist he  told me to take 3 pills instead of 1."  she reports her anxiety has decreased tremendously.  Rating her anxiety 5 out of 10 with 10 being the worst.  She denies suicidal or homicidal ideations.  Denies auditory or visual hallucinations.  Patient to continue with intensive outpatient programming.  Reports feeling better overall since attending the pharmaceutical group.  Reports learning about common  side effects with Seroquel.  Rates her depression 3 out of 10 with 10 being the worst.  Denies any other issues concerns during this assessment.  Patient admitted to intensive outpatient programming on 06/14/2019 as a stepdown from partial hospitalization programming. support, encouragement and reassurance was provided.  Associated Signs/Symptoms: Depression Symptoms:  depressed mood, feelings of worthlessness/guilt, difficulty concentrating, anxiety, (Hypo) Manic Symptoms:  Distractibility, Irritable Mood, Anxiety Symptoms:  Excessive Worry, Social Anxiety, Psychotic Symptoms:  Hallucinations: None  PTSD Symptoms: NA  Past Psychiatric History:  Previous inpatient admission.  Recently completed partial hospitalization programming.  Will start intensive outpatient programming on 06/14/2019. Reported multiple medication adjustments as this relates to her depression anxiety and PTSD diagnoses  Previous Psychotropic Medications: Yes   Substance Abuse History in the last 12 months:  No.  Consequences of Substance Abuse: NA  Past Medical History:  Past Medical History:  Diagnosis Date  . Anxiety   . Bipolar affect, depressed ([place])   . Depression     Past Surgical History:  Procedure Laterality Date  . BREAST SURGERY     lump removed  . ear drum replacement     right  . FRACTURE SURGERY      Family Psychiatric History:   Family History:  Family History  Problem Relation Age of Onset  . Bipolar disorder Father   . Drug abuse Father   . Breast cancer Neg Hx   . Colon cancer Neg Hx     Social History:   Social History   Socioeconomic History  . Marital status: Single    Spouse name: Not on file  . Number of children: Not on file  . Years of education: Not on file  . Highest education level: Not on file  Occupational History  . Not on file  Social Needs  . Financial resource strain: Not on file  . Food insecurity    Worry: Never true    Inability: Never true  . Transportation needs    Medical: No    Non-medical: No  Tobacco Use  . Smoking status: Current Some Day Smoker    Packs/day: 0.50    Years: 10.00    Pack years: 5.00    Types: Cigarettes  . Smokeless tobacco: Never Used  Substance and Sexual Activity  . Alcohol use: Yes    Comment: occ  . Drug use: No  . Sexual activity: Not Currently    Birth control/protection: Pill  Lifestyle  . Physical activity    Days per week: 0 days    Minutes per session: 0 min  . Stress: Rather much  Relationships  . Social connections    Talks on phone: More than three times a week    Gets together: Once a week    Attends religious service: More than 4 times  per year    Active member of club or organization: No    Attends meetings of clubs or organizations: Never    Relationship status: Never married  Other Topics Concern  . Not on file  Social History Narrative   HR x 6 years   Single, no children    Additional Social History:   Allergies:   Allergies  Allergen Reactions  . Ciprofloxacin Other (See Comments)    numbness    Metabolic Disorder Labs: No results found for: HGBA1C, MPG No results found for: PROLACTIN Lab Results  Component Value Date   CHOL 226 (H) 12/07/2018   TRIG 229.0 (H) 12/07/2018   HDL 52.60 12/07/2018   CHOLHDL 4 12/07/2018   VLDL 45.8 (H) 12/07/2018   Lab Results  Component Value Date   TSH 2.24 12/07/2018    Therapeutic Level Labs: No results found for: LITHIUM No results found for: CBMZ No results found for: VALPROATE  Current Medications: Current Outpatient Medications  Medication Sig Dispense Refill  . azelastine (ASTELIN) 0.1 % nasal spray Place 2 sprays into both nostrils 2 (two) times daily. Use in each nostril as directed  30 mL 12  . benzonatate (TESSALON) 200 MG capsule Take 1 capsule (200 mg total) by mouth 2 (two) times daily as needed for cough. 20 capsule 0  . doxycycline (VIBRA-TABS) 100 MG tablet Take 1 tablet (100 mg total) by mouth 2 (two) times daily. 14 tablet 0  . hydrOXYzine (ATARAX/VISTARIL) 25 MG tablet Take 25 mg by mouth 2 (two) times daily at 8 am and 10 pm.     . ibuprofen (ADVIL,MOTRIN) 200 MG tablet Take 400 mg by mouth every 6 (six) hours as needed.    Marland Kitchen ketorolac (TORADOL) 10 MG tablet Take 1 tablet (10 mg total) by mouth every 6 (six) hours as needed. 20 tablet 0  . lamoTRIgine (LAMICTAL) 150 MG tablet Take 150 mg by mouth 2 (two) times daily.    Marland Kitchen lurasidone (LATUDA) 80 MG TABS tablet Take 80 mg by mouth daily with breakfast.    . norgestimate-ethinyl estradiol (ESTARYLLA) 0.25-35 MG-MCG tablet Estarylla 0.25 mg-35 mcg tablet    . prazosin (MINIPRESS) 2 MG capsule  TK 2 CS PO QHS    . QUEtiapine (SEROQUEL) 25 MG tablet Take 1 tablet (25 mg total) by mouth 2 (two) times daily. 60 tablet 0  . sertraline (ZOLOFT) 100 MG tablet TK 1 T PO QAM    . tiZANidine (ZANAFLEX) 2 MG tablet Take 1 tablet (2 mg total) by mouth every 8 (eight) hours as needed for muscle spasms. 12 tablet 0  . traZODone (DESYREL) 50 MG tablet Take 50 mg by mouth at bedtime as needed for sleep.    . VENTOLIN HFA 108 (90 Base) MCG/ACT inhaler INL 2 PFS ITL Q 4 H FOR UP TO 10 DAYS PRF WHZ OR SOB    . VRAYLAR capsule Take 1.5 mg by mouth every morning.     No current facility-administered medications for this visit.     Musculoskeletal:   Psychiatric Specialty Exam: ROS  There were no vitals taken for this visit.There is no height or weight on file to calculate BMI.  General Appearance: NA  Eye Contact:  NA  Speech:  Clear and Coherent  Volume:  Normal  Mood:  Anxious and Depressed  Affect:  Congruent  Thought Process:  Coherent  Orientation:  Full (Time, Place, and Person)  Thought Content:  Hallucinations: None  Suicidal Thoughts:  No  Homicidal Thoughts:  No  Memory:  Immediate;   Fair Remote;   Fair  Judgement:  Good  Insight:  Good  Psychomotor Activity:  Normal  Concentration:  Concentration: Fair  Recall:  Good  Fund of Knowledge:Good  Language: Fair  Akathisia:  No  Handed:  Right  AIMS (if indicated):    Assets:  Communication Skills Desire for Improvement Financial Resources/Insurance Resilience Social Support  ADL's:  Intact  Cognition: WNL  Sleep:  Fair   Screenings: PHQ2-9     Counselor from 06/09/2019 in BEHAVIORAL HEALTH PARTIAL HOSPITALIZATION PROGRAM Counselor from 05/28/2019 in BEHAVIORAL HEALTH PARTIAL HOSPITALIZATION PROGRAM Office Visit from 12/07/2018 in [facility]  PHQ-2 Total Score  2  2  2   PHQ-9 Total Score  5  11  8       Assessment and Plan:  Admitted to intensive outpatient program Continue medications as  directed  Treatment plan was reviewed and agreed upon by NP T. Markhi Kleckner and patient [employer] need for continued group services   Sarah Rack, NP 8/26/202012:08 PM

## 2019-06-16 NOTE — Progress Notes (Signed)
Virtual Visit via Video Note  I connected with Sarah Cardenas on 06/16/19 at  9:00 AM EDT by a video enabled telemedicine application and verified that I am speaking with the correct person using two identifiers.  Location: Patient: Sarah Cardenas Provider: Lise Auer, LCSW   I discussed the limitations of evaluation and management by telemedicine and the availability of in person appointments. The patient expressed understanding and agreed to proceed.  History of Present Illness: Bipolar 1   Observations/Objective: Case Manager checked in with all participants to review discharge dates, insurance authorizations, work-related documents and needs for the treatment team. Counselor introduced our guest speaker, Einar Grad, [facility] Pharmacist, who shared about psychiatric medications, side effects, treatment considerations and how to communicate with medical professionals. Each group member asked questions and shared medication concerns. Counselor checked in with all group members to assess progress, coping skills used and treatment needs. Sarah Cardenas shared that she had a difficult afternoon with side-effects from medications and that she was able to go out to eat with her parents, making her feel better. Counselor allowed time for group members to do a body scan to document concerns and questions they may have for their medical providers. Group members shared their reflections with the group. Sarah Cardenas had several concerns she would like to talk with her doctor about in upcoming appointments.  Counselor shared psychoeducation on Self-Esteem via a video entitled, "The 6 Pillars of Self-Esteem: Understanding and Fixing It". Counselor prompted each member to share which area they would like to focus development. Sarah Cardenas identified a couple areas related to her self-esteem that she is actively working on and would like to continue the work.  Counselor provided 4 worksheets related to self-esteem for group members to  complete on their own time for more work and exploration in this area. Counselor wrapped up group and had everyone share a self-care or productivity task can plan for today. Sarah Cardenas plans to cut her friends hair, to read and to hang out.   Assessment and Plan: Counselor recommends that patient remains in IOP treatment to better manage mental health symptoms and continue to address treatment plan goals. Counselor recommends adherence to crisis/safety plan, taking medications as prescribed and following up with medical professionals if any issues arise.   Follow Up Instructions: Counselor will send Webex link for next session.    I discussed the assessment and treatment plan with the patient. The patient was provided an opportunity to ask questions and all were answered. The patient agreed with the plan and demonstrated an understanding of the instructions.   The patient was advised to call back or seek an in-person evaluation if the symptoms worsen or if the condition fails to improve as anticipated.  I provided 180 minutes of non-face-to-face time during this encounter.   Lise Auer, LCSW

## 2019-06-17 ENCOUNTER — Other Ambulatory Visit (HOSPITAL_COMMUNITY): Payer: No Typology Code available for payment source | Admitting: Psychiatry

## 2019-06-17 ENCOUNTER — Other Ambulatory Visit: Payer: Self-pay

## 2019-06-17 ENCOUNTER — Encounter (HOSPITAL_COMMUNITY): Payer: Self-pay

## 2019-06-17 DIAGNOSIS — F3130 Bipolar disorder, current episode depressed, mild or moderate severity, unspecified: Secondary | ICD-10-CM | POA: Diagnosis not present

## 2019-06-17 NOTE — Progress Notes (Signed)
Virtual Visit via Video Note  I connected with Sarah Cardenas on 06/17/19 at  9:00 AM EDT by a video enabled telemedicine application and verified that I am speaking with the correct person using two identifiers.  Location: Patient: Sarah Cardenas Provider: Lise Auer, LCSW   I discussed the limitations of evaluation and management by telemedicine and the availability of in person appointments. The patient expressed understanding and agreed to proceed.  History of Present Illness: Bipolar 1 DO    Observations/Objective: Case Manager checked in with all participants to review discharge dates, insurance authorizations, work-related documents and needs for the treatment team. Counselor introduced guest speaker, Jeanella Craze, [place], to facilitate a discussion around Grief and Loss topics. Sarah Cardenas journal about her grief and loss and how she grieves in relation to her family. Counselor prompted the group to journal about the G&L issues they would like to further process with their individual therapist. Counselor engaged the group in ice breaker and checking in on mood, functioning and use of coping skills. Sarah Cardenas reported having a rough afternoon and morning so far, stating she is in a "mood" and isn't sure why, so she just wanted to lay around and cam down. Counselor shared psychoeducation on panic attacks and explored what panic attacks look like for each individual. Sarah Cardenas reported having panic attacks in the past and being willing to try techniques. Counselor facilitated a guided imagery entitled, "Relaxation for Dealing with Anger." All members participated and shared feedback on their experience. Sarah Cardenas reported benefiting from the exercise, helping her to feel a little less tense. Counselor closed by having each share what their plans are for the evening and with words of encouragement for a graduating member. Sarah Cardenas stated that she plans to get her air cut and colored today, so she is  looking forward to self-care time.   Assessment and Plan: Counselor recommends that patient remains in IOP treatment to better manage mental health symptoms and continue to address treatment plan goals. Counselor recommends adherence to crisis/safety plan, taking medications as prescribed and following up with medical professionals if any issues arise.   Follow Up Instructions: Counselor will send Webex link for next session.    I discussed the assessment and treatment plan with the patient. The patient was provided an opportunity to ask questions and all were answered. The patient agreed with the plan and demonstrated an understanding of the instructions.   The patient was advised to call back or seek an in-person evaluation if the symptoms worsen or if the condition fails to improve as anticipated.  I provided 180 minutes of non-face-to-face time during this encounter.   Lise Auer, LCSW

## 2019-06-18 ENCOUNTER — Encounter (HOSPITAL_COMMUNITY): Payer: Self-pay

## 2019-06-18 ENCOUNTER — Other Ambulatory Visit: Payer: Self-pay

## 2019-06-18 ENCOUNTER — Other Ambulatory Visit (HOSPITAL_COMMUNITY): Payer: No Typology Code available for payment source | Admitting: Psychiatry

## 2019-06-18 DIAGNOSIS — F3130 Bipolar disorder, current episode depressed, mild or moderate severity, unspecified: Secondary | ICD-10-CM

## 2019-06-18 NOTE — Progress Notes (Signed)
Virtual Visit via Video Note  I connected with Sarah Cardenas on 06/18/19 at  9:00 AM EDT by a video enabled telemedicine application and verified that I am speaking with the correct person using two identifiers.  Location: Patient: Sarah Cardenas Provider: Lise Auer, LCSW   I discussed the limitations of evaluation and management by telemedicine and the availability of in person appointments. The patient expressed understanding and agreed to proceed.  History of Present Illness: Bipolar 1 DO   Observations/Objective: Case Manager checked in with all participants to review discharge dates, insurance authorizations, work-related documents and needs for the treatment team. Counselor processed current mood and functioning and discussed how participants spent their time since last session and if skills were applied. Sarah Cardenas shared that she was in a better mood today and that she was able to do an afternoon of self-care. She shared that she actually liked how she looked and requested a friend take pictures of her new look. Sarah Cardenas shared about the stigma of participating in therapy and her struggles with shopping during her manic episodes. Counselor introduced the group to the concept of Cognitive Distortions and Cognitive Coping by doing an activity using their names as an acrostic, one version being positive attributes and the other being negative views of self. Group members shared responses and their thought process behind the words they chose to represent themselves. Sarah Cardenas was able to come up with some positive attributes, but found the negative more natural to believe. Counselor shared a psychoeducational video named, "15 Styles of Distorted Thinking" to increase vocabulary and understanding of the different ways people have cognitive distortions. Counselor allowed group members to process allowed the information shared on the video and a handout with the definitions. Sarah Cardenas identified several ways her  thoughts can be distorted and the ways she attempts to cope. Counselor provided group members with 2 worksheets entitled, "Examining the Evidence" and "Socratic Thinking", 2 Cognitive Coping strategies to challenge distorted cognitions. Counselor encouraged group members to work on the handouts as homework to sort through negative and automatic thoughts they have been struggling with. Counselor wrapped up group with allowing all to share their plans for the weekend, self-care plan or productivity activity. Sarah Cardenas shared that two of her friends delivered babies yesterday and today, so she was excited to get to see them soon. She also plans to cut a few peoples hair this weekend.   Assessment and Plan: Counselor recommends that patient remains in IOP treatment to better manage mental health symptoms and continue to address treatment plan goals. Counselor recommends adherence to crisis/safety plan, taking medications as prescribed and following up with medical professionals if any issues arise.   Follow Up Instructions: Counselor will send Webex link for next session.    I discussed the assessment and treatment plan with the patient. The patient was provided an opportunity to ask questions and all were answered. The patient agreed with the plan and demonstrated an understanding of the instructions.   The patient was advised to call back or seek an in-person evaluation if the symptoms worsen or if the condition fails to improve as anticipated.  I provided 180 minutes of non-face-to-face time during this encounter.   Lise Auer, LCSW

## 2019-06-20 NOTE — Psych (Signed)
Virtual Visit via Video Note  I connected with Sarah DakinAmber Nicole Sarate on 06/07/19 at  9:00 AM EDT by a video enabled telemedicine application and verified that I am speaking with the correct person using two identifiers.   I discussed the limitations of evaluation and management by telemedicine and the availability of in person appointments. The patient expressed understanding and agreed to proceed.  I discussed the assessment and treatment plan with the patient. The patient was provided an opportunity to ask questions and all were answered. The patient agreed with the plan and demonstrated an understanding of the instructions.   The patient was advised to call back or seek an in-person evaluation if the symptoms worsen or if the condition fails to improve as anticipated.  Pt was provided 240 minutes of non-face-to-face time during this encounter.   Donia GuilesJenny Wynelle Dreier, LCSW    [facility] PHP THERAPIST PROGRESS NOTE  Sarah Dakinmber Nicole Cardenas [id number]  Session Time: 9:00 - 10:00  Participation Level: Active  Behavioral Response: CasualAlertDepressed  Type of Therapy: Group Therapy  Treatment Goals addressed: Coping  Interventions: CBT, DBT, Supportive and Reframing  Summary: Clinician led check-in regarding current stressors and situation, and review of patient completed daily inventory. Clinician utilized active listening and empathetic response and validated patient emotions. Clinician facilitated processing group on pertinent issues.   Therapist Response: Sarah Cardenas is a 33 y.o. female who presents with depression and anxiety symptoms. Patient arrived within time allowed and reports that she is feeling "good." Patient rates her mood at a 8 on a scale of 1-10 with 10 being great. Pt reports she had a "pretty good weekend" and slept well the past three nights and achieved a decent balance of social, restorative, and productive time. Pt reports struggling with managing her downtime and getting  used to not having to be stimulated all the time. Pt able to process. Patient engaged in discussion.      Session Time: 10:00 -11:00  Participation Level: Active  Behavioral Response: CasualAlertDepressed  Type of Therapy: Group Therapy, psychoeducation, psychotherapy  Treatment Goals addressed: Coping  Interventions: CBT, DBT, Solution Focused, Supportive and Reframing  Summary:  Cln led discussion on ways in which we let other people's reactions affect us. Group shared things they do or do not do because of negative reactions from others. Cln offered ways to reframe our perceptions including boundary tenets, protective factors, and work arounds.    Therapist Response: Patient participated in discussion and shares that negative or critical reactions from her family bother her more than reactions from other people because it feels more personal.       Session Time: 11:00- 12:00  Participation Level: Active  Behavioral Response: CasualAlertAnxious  Type of Therapy: Group Therapy, Psychoeducation; Psychotherapy  Treatment Goals addressed: Coping  Interventions: CBT; DBT; Solution focused; Supportive; Reframing  Summary: Clinician continued topic of distress tolerance skills and introduced ACCEPTS skills. Group members discussed ways they could utilize the skills in their everyday life.   Therapist Response: Pt participated in discussion and reports understanding of ACCEPTS skills. Pt states she is most likely to use activities.       Session Time: 12:00 -1:00  Participation Level: Active  Behavioral Response: CasualAlertDepressed  Type of Therapy: Group Therapy, Psychoeducation; Psychotherapy  Treatment Goals addressed: Coping  Interventions: CBT; DBT; Solution focused; Supportive; Reframing  Summary:12:00 - 12:50:Cln continued topic of distress tolerance skills and led activity on planning ahead to increase ability to utilize  skills. Group members completed different  emotion cards, brainstormed all options of activities, and made lists of shows, books, music, and podcasts to listen to. Group shared how they would make these plans easily accessible.    12:50 -1:00 Clinician led check-out. Clinician assessed for immediate needs, medication compliance and efficacy, and safety concerns   Therapist Response: 12:00 - 12:50: Patient engaged in activity and identifies she will keep her lists at her desk for easy perusal.  12:50 - 1:00: At check-out, patient rates her mood at a 8 on a scale of 1-10 with 10 being great. Patient reports afternoon plans of meeting her therapist and reading. Patient demonstrates some progress as evidenced by maintaining level mood over the weekend. Patient denies SI/HI/self-harm at the end of group.      Suicidal/Homicidal: Nowithout intent/plan  Plan: Pt will continue in PHP while working to stabilize mood, decrease symptoms, and increase ability to manage symptoms in a healthy manner.   Diagnosis: Bipolar I disorder, most recent episode depressed ([place]) [F31.30]    1. Bipolar I disorder, most recent episode depressed ([place])       Lorin Glass, LCSW 06/20/2019

## 2019-06-21 ENCOUNTER — Other Ambulatory Visit (HOSPITAL_COMMUNITY): Payer: No Typology Code available for payment source | Admitting: Psychiatry

## 2019-06-21 ENCOUNTER — Encounter (HOSPITAL_COMMUNITY): Payer: Self-pay

## 2019-06-21 ENCOUNTER — Other Ambulatory Visit: Payer: Self-pay

## 2019-06-21 DIAGNOSIS — F3130 Bipolar disorder, current episode depressed, mild or moderate severity, unspecified: Secondary | ICD-10-CM

## 2019-06-21 NOTE — Psych (Signed)
Virtual Visit via Video Note  I connected with Sarah Cardenas on 06/09/19 at  9:00 AM EDT by a video enabled telemedicine application and verified that I am speaking with the correct person using two identifiers.   I discussed the limitations of evaluation and management by telemedicine and the availability of in person appointments. The patient expressed understanding and agreed to proceed.  I discussed the assessment and treatment plan with the patient. The patient was provided an opportunity to ask questions and all were answered. The patient agreed with the plan and demonstrated an understanding of the instructions.   The patient was advised to call back or seek an in-person evaluation if the symptoms worsen or if the condition fails to improve as anticipated.  Pt was provided 240 minutes of non-face-to-face time during this encounter.   Sarah GuilesJenny Kayden Amend, LCSW    [facility] PHP THERAPIST PROGRESS NOTE  Sarah Cardenas [id number]  Session Time: 9:00 - 10:00  Participation Level: Active  Behavioral Response: CasualAlertDepressed  Type of Therapy: Group Therapy  Treatment Goals addressed: Coping  Interventions: CBT, DBT, Supportive and Reframing  Summary: Clinician led check-in regarding current stressors and situation, and review of patient completed daily inventory. Clinician utilized active listening and empathetic response and validated patient emotions. Clinician facilitated processing group on pertinent issues.   Therapist Response: Sarah Cardenas is a 33 y.o. female who presents with depression and anxiety symptoms. Patient arrived within time allowed and reports that she is feeling "good." Patient rates her mood at a 9 on a scale of 1-10 with 10 being great. Pt reports she she slept "really good" and took a nap in the afternoon. Pt share she did take her sleep medication last night. Pt reports yesterday went well and she got to see her baby cousin and had dinner with her  grandmother. Patient engaged in discussion.      Session Time: 10:00-11:00  Participation Level:Active  Behavioral Response:CasualAlertDepressed  Type of Therapy: Group Therapy, psychoeducation, psychotherapy  Treatment Goals addressed: Coping  Interventions:CBT, DBT, Solution Focused, Supportive and Reframing  Summary:Cln led discussion on increasing motivation. Cln challenged pt's to reframe the way they view motivation and seek to adjust expectations and offer themselves grace versus looking for tips to "force" themselves to do something. Cln encouraged pt's to think about "need" and "want" seriously as a way to decrease judgment for what they are achieving.    Therapist Response: Patient participated in discussion and struggling with feeling able to do tasks that take a lot of time or energy. Pt gives example of getting ready in the mornings and not doing her hair and makeup as she normally would.       Session Time: 11:00 -12:15  Participation Level: Active  Behavioral Response: CasualAlertDepressed  Type of Therapy: Group Therapy, psychotherapy  Treatment Goals addressed: Coping  Interventions: Strengths based, reframing, Supportive,   Summary:  Spiritual Care group  Therapist Response: Patient engaged in group. See chaplain note.        Session Time: 12:00- 1:00  Participation Level: Active  Behavioral Response: CasualAlertDepressed  Type of Therapy: Group Therapy, Psychoeducation  Treatment Goals addressed: Coping  Interventions: relaxation training; Supportive; Reframing  Summary: 12:00 - 12:50: Relaxation group: Cln led group focused on retraining the body's response to stress.   12:50 -1:00 Clinician led check-out. Clinician assessed for immediate needs, medication compliance and efficacy, and safety concerns   Therapist Response: Patient engaged in activity and discussion. At check-out, patient rates  her mood at  a 9 on a scale of 1-10 with 10 being great. Patient reports afternoon plans of straightening her hair and taking a nap. Patient demonstrates some progress as evidenced by actively seeking ways to problem solve struggles. Patient denies SI/HI/self-harm thoughts at the end of group.    Suicidal/Homicidal: Nowithout intent/plan  Plan: Pt will continue in PHP while working to stabilize mood, decrease symptoms, and increase ability to manage symptoms in a healthy manner.   Diagnosis: Bipolar I disorder, most recent episode depressed ([place]) [F31.30]    1. Bipolar I disorder, most recent episode depressed ([place])       Sarah Glass, LCSW 06/21/2019

## 2019-06-21 NOTE — Psych (Signed)
Virtual Visit via Video Note  I connected with Sarah Cardenas on 06/10/19 at  9:00 AM EDT by a video enabled telemedicine application and verified that I am speaking with the correct person using two identifiers.   I discussed the limitations of evaluation and management by telemedicine and the availability of in person appointments. The patient expressed understanding and agreed to proceed.  I discussed the assessment and treatment plan with the patient. The patient was provided an opportunity to ask questions and all were answered. The patient agreed with the plan and demonstrated an understanding of the instructions.   The patient was advised to call back or seek an in-person evaluation if the symptoms worsen or if the condition fails to improve as anticipated.  Pt was provided 240 minutes of non-face-to-face time during this encounter.   Donia GuilesJenny Gradie Butrick, LCSW    [facility] PHP THERAPIST PROGRESS NOTE  Sarah Cardenas [id number]  Session Time: 9:00 - 10:00  Participation Level: Active  Behavioral Response: CasualAlertDepressed  Type of Therapy: Group Therapy  Treatment Goals addressed: Coping  Interventions: CBT, DBT, Supportive and Reframing  Summary: Clinician led check-in regarding current stressors and situation, and review of patient completed daily inventory. Clinician utilized active listening and empathetic response and validated patient emotions. Clinician facilitated processing group on pertinent issues.   Therapist Response: Sarah Cardenas is a 33 y.o. female who presents with depression and anxiety symptoms. Patient arrived within time allowed and reports that she is feeling "prepared." Patient rates her mood at a 9 on a scale of 1-10 with 10 being great. Pt reports she spent the afternoon with her mom yesterday and they came up with a plan to ease her back into work and she is feeling confident about it. Pt states she also did her hair and did not nap yesterday  which made her feel productive. Patient engaged in discussion.      Session Time: 10:00 -11:00  Participation Level: Active  Behavioral Response: CasualAlertDepressed  Type of Therapy: Group Therapy, psychoeducation, psychotherapy  Treatment Goals addressed: Coping  Interventions: CBT, DBT, Solution Focused, Supportive and Reframing  Summary: Cln led discussion on how to discuss absences or altered behaviors due to mental health. Group shared concerns regarding returning to work or answering the question "what have you been up to" as stressors. Cln processed with pt's and brainstormed with group different ways to handle these stressors as they arise.   Therapist Response: Patient participated in discussion and reports feeling nervous that she will "blurt out" her business and share more than she wants to at work. Pt shares she will come up with a few basic answers to common questions she may get asked to decrease the chance she will say more than she wants to.      Session Time: 11:00- 12:00  Participation Level: Active  Behavioral Response: CasualAlertDepressed  Type of Therapy: Group Therapy, Psychoeducation; Psychotherapy  Treatment Goals addressed: Coping  Interventions: CBT; Solution focused; Supportive; Reframing  Summary: Clinician introduced topic of fear setting. Group viewed TED talk "Why you should define your fears instead of your goals."  Group utilized fear setting model to work through a current fear and analyze it.     Therapist Response: Patient engaged in activity and discussion. Pt verbally completed fear setting model with a group example and reports confidence in applying it personally.      Session Time: 12:00 -1:00  Participation Level:Active  Behavioral Response:CasualAlertDepressed  Type of Therapy: Group Therapy,  OT  Treatment Goals addressed: Coping  Interventions:Psychosocial skills training, Supportive,    Summary:12:00 - 12:50:Occupational Therapy group 12:50 -1:00 Clinician led check-out. Clinician assessed for immediate needs, medication compliance and efficacy, and safety concerns   Therapist Response: 12:00 - 12:50: Patient engaged in group. See OT note.  12:50 - 1:00: At check-out, patient rates her mood at a 9 on a scale of 1-10 with 10 being great. Pt states afternoon plans of taking a nap and reading. Patient demonstrates some progress as evidenced by managing anxiety with skills when thinking about work. Patient denies SI/HI/self-harm at the end of group.    Suicidal/Homicidal: Nowithout intent/plan  Plan: Pt will continue in PHP while working to stabilize mood, decrease symptoms, and increase ability to manage symptoms in a healthy manner.   Diagnosis: Bipolar I disorder, most recent episode depressed ([place]) [F31.30]    1. Bipolar I disorder, most recent episode depressed ([place])       Lorin Glass, LCSW 06/21/2019

## 2019-06-21 NOTE — Psych (Signed)
Virtual Visit via Video Note  I connected with Denton Lank on 06/11/19 at  9:00 AM EDT by a video enabled telemedicine application and verified that I am speaking with the correct person using two identifiers.   I discussed the limitations of evaluation and management by telemedicine and the availability of in person appointments. The patient expressed understanding and agreed to proceed.  I discussed the assessment and treatment plan with the patient. The patient was provided an opportunity to ask questions and all were answered. The patient agreed with the plan and demonstrated an understanding of the instructions.   The patient was advised to call back or seek an in-person evaluation if the symptoms worsen or if the condition fails to improve as anticipated.  Pt was provided 240 minutes of non-face-to-face time during this encounter.   Lorin Glass, LCSW    [facility] PHP THERAPIST PROGRESS NOTE  Ermal Brzozowski [id number]  Session Time: 9:00 - 10:00  Participation Level: Active  Behavioral Response: CasualAlertDepressed  Type of Therapy: Group Therapy  Treatment Goals addressed: Coping  Interventions: CBT, DBT, Supportive and Reframing  Summary: Clinician led check-in regarding current stressors and situation, and review of patient completed daily inventory. Clinician utilized active listening and empathetic response and validated patient emotions. Clinician facilitated processing group on pertinent issues.   Therapist Response: Neriyah Cercone is a 33 y.o. female who presents with depression and anxiety symptoms. Patient arrived within time allowed and reports that she is feeling "pretty good." Patient rates her mood at a 9 on a scale of 1-10 with 10 being great. Pt reports she had a low key afternoon and read a whole book and rested. Pt reports she also spoke to her brother on the phone and it went well and he was supportive. Pt shares she continues to experience a  more level mood. Patient engaged in discussion.      Session Time: 10:00 -11:00  Participation Level: Active  Behavioral Response: CasualAlertDepressed  Type of Therapy: Group Therapy, psychoeducation, psychotherapy  Treatment Goals addressed: Coping  Interventions: CBT, DBT, Solution Focused, Supportive and Reframing  Summary: Cln led discussion on managing anxiety regarding life post-treatment. Cln encouraged pt's to consider which aspects of treatment have been helpful to them and replicate it in their post-treatment life. Group discussed ways to establish routine and creatively meet needs while in different schedules.   Therapist Response: Patient participated in discussion and reports she feels routine and cues for skills are what she needs to replicate post-treatment. Pt is able to brainstorm ways to meet those needs.         Session Time: 11:00- 12:00  Participation Level: Active  Behavioral Response: CasualAlertDepressed  Type of Therapy: Group Therapy, Psychoeducation; Psychotherapy  Treatment Goals addressed: Coping  Interventions: CBT; Solution focused; Supportive; Reframing  Summary: Clinician introduced topic of "Positive Psychology." Group watched "The Happiness Advantage" TED talk and discussed how the "lens" through which they view life affects the way they feel. Pts identified a strategy they would be willing to try to change their "lens."    Therapist Response: Pt engaged in discussion regarding ways to train your mind to scan for the positive. Pt reports willingness to try daily gratitudes and positive event journaling as a way to practice.        Session Time: 12:00 -1:00  Participation Level:Active  Behavioral Response:CasualAlertDepressed  Type of Therapy: Group Therapy, OT  Treatment Goals addressed: Coping  Interventions:Psychosocial skills training, Supportive,   Summary:12:00 -  12:50:Occupational  Therapy group 12:50 -1:00 Clinician led check-out. Clinician assessed for immediate needs, medication compliance and efficacy, and safety concerns   Therapist Response: 12:00 - 12:50: Patient engaged in group. See OT note.  12:50 - 1:00: At check-out, patient rates her mood at a 9 on a scale of 1-10 with 10 being great. Pt states afternoon plans of cleaning and spending the afternoon with her little cousin. Patient demonstrates some progress as evidenced by making proactive plans to cope with stressful situations. Patient denies SI/HI/self-harm at the end of group.    Suicidal/Homicidal: Nowithout intent/plan  Plan: Pt will discharge from PHP due to meeting treatment goals of stabilized mood, decreased symptoms, and increased ability to manage symptoms in a healthy manner. Pt will step down to IOP to further stabilize while increasing more life responsibilities into her days. Pt will begin IOP within this agency on 8/25. Pt and provider are aligned with discharge. Pt denies SI/HI at time of discharge.   Diagnosis: Bipolar I disorder, most recent episode depressed ([facility]) [F31.30]    1. Bipolar I disorder, most recent episode depressed ([facility])   2. Difficulty coping       Donia GuilesJenny Christien Berthelot, LCSW 06/21/2019

## 2019-06-21 NOTE — Progress Notes (Signed)
Virtual Visit via Video Note  I connected with Sarah Cardenas on 06/21/19 at  9:00 AM EDT by a video enabled telemedicine application and verified that I am speaking with the correct person using two identifiers.  Location: Patient: Sarah Cardenas Provider: Lise Auer, LCSW   I discussed the limitations of evaluation and management by telemedicine and the availability of in person appointments. The patient expressed understanding and agreed to proceed.  History of Present Illness: Bipolar 1 DO   Observations/Objective: Case Manager checked in with all participants to review discharge dates, insurance authorizations, work-related documents and needs for the treatment team. Counselor processed current mood and functioning and discussed how participants spent their time since last session and if skills were applied. Angy stated her mood was improving over the weekend with family, friend and self-care time implemented, but that she was feeling "over it" today. Counselor discussed the concept of "getting better at feeling vs feeling better." Group members assessed their abilities to do so. Counselor shared a psychoeducational video on the concept of "Willingness to Accept Feelings as the Come." Counselor received feedback from group members on the information and how they can apply this skill. Kayln improved her mindset during the activity and shared feedback with the group on her perspective. Counselor shared examples and gathered examples from the group about handling difficult and uncomfortable conversations, especially on the job. Group members discussed strategies. Counselor provided the group with information on 8 breathing exercises to reduce anxiety, allowing group members to practice the skills in session.  Counselor wrapped up group with allowing all to share their plans for the day, self-care plan or productivity activity. Madilynn plans to meet with her therapist today and to take a nap due  to the rainy weather.   Assessment and Plan: Counselor recommends that patient remains in IOP treatment to better manage mental health symptoms and continue to address treatment plan goals. Counselor recommends adherence to crisis/safety plan, taking medications as prescribed and following up with medical professionals if any issues arise.   Follow Up Instructions: Counselor will send Webex link for next session.    I discussed the assessment and treatment plan with the patient. The patient was provided an opportunity to ask questions and all were answered. The patient agreed with the plan and demonstrated an understanding of the instructions.   The patient was advised to call back or seek an in-person evaluation if the symptoms worsen or if the condition fails to improve as anticipated.  I provided 180 minutes of non-face-to-face time during this encounter.   Lise Auer, LCSW

## 2019-06-21 NOTE — Psych (Signed)
Virtual Visit via Video Note  I connected with Sarah Cardenas on 06/08/19 at  9:00 AM EDT by a video enabled telemedicine application and verified that I am speaking with the correct person using two identifiers.   I discussed the limitations of evaluation and management by telemedicine and the availability of in person appointments. The patient expressed understanding and agreed to proceed.  I discussed the assessment and treatment plan with the patient. The patient was provided an opportunity to ask questions and all were answered. The patient agreed with the plan and demonstrated an understanding of the instructions.   The patient was advised to call back or seek an in-person evaluation if the symptoms worsen or if the condition fails to improve as anticipated.  Pt was provided 240 minutes of non-face-to-face time during this encounter.   Sarah GuilesJenny Waylyn Tenbrink, LCSW    [facility] THERAPIST PROGRESS NOTE  Sarah Cardenas [id number]  Session Time: 9:00 - 10:00  Participation Level: Active  Behavioral Response: CasualAlertDepressed  Type of Therapy: Group Therapy  Treatment Goals addressed: Coping  Interventions: CBT, DBT, Supportive and Reframing  Summary: Clinician led check-in regarding current stressors and situation, and review of patient completed daily inventory. Clinician utilized active listening and empathetic response and validated patient emotions. Clinician facilitated processing group on pertinent issues.   Therapist Response: Sarah Cardenas is a 33 y.o. female who presents with depression and anxiety symptoms. Patient arrived within time allowed and reports that she is feeling "really tired." Patient rates her mood at a 8 on a scale of 1-10 with 10 being great. Pt reports she did not sleep well and has been up since 4AM. Pt shares she did not take her sleep medication last night because she does not want to become dependent on it. Pt states she has been having  alcohol cravings the past week or so and has been staying at home to help manage. Pt is able to state adaptations she is taking to manage and reports they are "not that bad I'm just trying to stay on top of it."  Pt able to process. Patient engaged in discussion.      Session Time: 10:00 -11:00  Participation Level: Active  Behavioral Response: CasualAlertDepressed  Type of Therapy: Group Therapy, psychoeducation, psychotherapy  Treatment Goals addressed: Coping  Interventions: CBT, DBT, Solution Focused, Supportive and Reframing  Summary: Cln led discussion on ways to ground using distress tolerance skills. Cln discussed ways grounding can be useful for trauma symptoms.   Therapist Response: Patient participated in discussion and counting and [phone] as ways she will utilize to ground.      Session Time: 11:00- 12:00  Participation Level: Active  Behavioral Response: CasualAlertDepressed  Type of Therapy: Group Therapy, Psychoeducation; Psychotherapy  Treatment Goals addressed: Coping  Interventions: CBT; Solution focused; Supportive; Reframing  Summary: Clinician continued topic of distress tolerance skills and introduced Self-Soothe skills. Group members discussed ways they could utilize the skills in their everyday life.      Therapist Response: Pt reports understanding of skill and states smelling candles, petting her dog, and wrapping up in blankets as ways to practice the skill.       Session Time: 12:00 -1:00  Participation Level:Active  Behavioral Response:CasualAlertDepressed  Type of Therapy: Group Therapy, OT  Treatment Goals addressed: Coping  Interventions:Psychosocial skills training, Supportive,   Summary:12:00 - 12:50:Occupational Therapy group 12:50 -1:00 Clinician led check-out. Clinician assessed for immediate needs, medication compliance and efficacy, and safety concerns  Therapist Response: 12:00 -  12:50: Patient engaged in group. See OT note.  12:50 - 1:00: At check-out, patient rates her mood at a 9 on a scale of 1-10 with 10 being great. Pt states afternoon plans of taking a nap and going to dinner with her Sarah Cardenas. Patient demonstrates some progress as evidenced by managing alcohol cravings. Patient denies SI/HI/self-harm at the end of group.   Suicidal/Homicidal: Nowithout intent/plan  Plan: Pt will continue in PHP while working to stabilize mood, decrease symptoms, and increase ability to manage symptoms in a healthy manner.   Diagnosis: Bipolar I disorder, most recent episode depressed ([place]) [F31.30]    1. Bipolar I disorder, most recent episode depressed ([place])       Lorin Glass, LCSW 06/21/2019

## 2019-06-22 ENCOUNTER — Telehealth (HOSPITAL_COMMUNITY): Payer: Self-pay | Admitting: Psychiatry

## 2019-06-22 ENCOUNTER — Other Ambulatory Visit: Payer: Self-pay

## 2019-06-22 ENCOUNTER — Other Ambulatory Visit (HOSPITAL_COMMUNITY): Payer: No Typology Code available for payment source | Admitting: Psychiatry

## 2019-06-22 ENCOUNTER — Other Ambulatory Visit (HOSPITAL_COMMUNITY): Payer: No Typology Code available for payment source

## 2019-06-22 ENCOUNTER — Encounter (HOSPITAL_COMMUNITY): Payer: Self-pay

## 2019-06-22 NOTE — Telephone Encounter (Signed)
D:  Group leader Sarah Cardenas, [name]) informed case manager that pt logged off at the very beginning of group and never logged back on.  Writer placed call to pt.  Pt states she is having a bad day today.  Reports nothing happened this morning or lastnight.  "I just woke up this way.  I've been talking to my brother who works at a behavioral health facility throughout the morning."  Pt mentioned she is trying very hard not to drink (ETOH).  According to pt, she has thirty days being abstinent.  Pt denies SI/HI or A/V hallucinations.  Admits to taking her meds as prescribed.  A:  Provided pt with support.  Strongly encouraged an [place] meeting.  Reiterated safety plan; if pt starts feeling unsafe.  Informed treatment team; along with Ricky Ala, NP.  R:  Pt receptive.

## 2019-06-22 NOTE — Progress Notes (Signed)
Virtual Visit via Video Note  I connected with Sarah Cardenas on 06/22/19 at  9:00 AM EDT by a video enabled telemedicine application and verified that I am speaking with the correct person using two identifiers.  Location: Patient: Sarah Cardenas

## 2019-06-23 ENCOUNTER — Other Ambulatory Visit (HOSPITAL_COMMUNITY): Payer: No Typology Code available for payment source | Attending: Family | Admitting: Psychiatry

## 2019-06-23 ENCOUNTER — Other Ambulatory Visit: Payer: Self-pay

## 2019-06-23 DIAGNOSIS — R45851 Suicidal ideations: Secondary | ICD-10-CM | POA: Insufficient documentation

## 2019-06-23 DIAGNOSIS — F3130 Bipolar disorder, current episode depressed, mild or moderate severity, unspecified: Secondary | ICD-10-CM

## 2019-06-23 DIAGNOSIS — F329 Major depressive disorder, single episode, unspecified: Secondary | ICD-10-CM | POA: Diagnosis present

## 2019-06-23 DIAGNOSIS — F1911 Other psychoactive substance abuse, in remission: Secondary | ICD-10-CM | POA: Diagnosis not present

## 2019-06-23 NOTE — Progress Notes (Deleted)
Not Present

## 2019-06-23 NOTE — Progress Notes (Signed)
Virtual Visit via Video Note  I connected with Denton Lank on 06/23/19 at  9:00 AM EDT by a video enabled telemedicine application and verified that I am speaking with the correct person using two identifiers.  Location: Patient: Sarah Cardenas Provider: Lise Auer, LCSW   I discussed the limitations of evaluation and management by telemedicine and the availability of in person appointments. The patient expressed understanding and agreed to proceed.  History of Present Illness: Bipolar 1   Observations/Objective: Case Manager checked in with all participants to review discharge dates, insurance authorizations, work-related documents and needs for the treatment team. Counselor engaged group in an ice breaker of sharing their wildest travel story, to think about a time where they overcame challenges in adversities and can now laugh about the experience. Olga shared about a terrifying carriage ride in [place], now vowing to never get near a horse. Counselor shared psychoeducation on management of flashbacks and dissociation as well as compulsive behaviors in relation to trauma experiences. Group members shared about their experiences with flashbacks, compulsions and dissociations. Lourdez shared about compulsive behaviors that impact her functioning and applying skills to better manage them. Counselor opened up the group to share resources and coping tools with each other. Lindora shared helpful resources with the group. Counselor shared local, state and national mental health resources with the group to access for maintenance of their mental health symptoms. Latissa was appreciative and may get connected with some groups. Three group members graduated today, so group members and Counselor shared encouraging words and the graduates reflected on their progress before ending the session. Ronie sent members off with encouragement.   Assessment and Plan: Counselor recommends that patient remains in IOP  treatment to better manage mental health symptoms and continue to address treatment plan goals. Counselor recommends adherence to crisis/safety plan, taking medications as prescribed and following up with medical professionals if any issues arise.   Follow Up Instructions: Counselor will send Webex link for next session.    I discussed the assessment and treatment plan with the patient. The patient was provided an opportunity to ask questions and all were answered. The patient agreed with the plan and demonstrated an understanding of the instructions.   The patient was advised to call back or seek an in-person evaluation if the symptoms worsen or if the condition fails to improve as anticipated.  I provided 180 minutes of non-face-to-face time during this encounter.   Lise Auer, LCSW

## 2019-06-24 ENCOUNTER — Encounter (HOSPITAL_COMMUNITY): Payer: Self-pay

## 2019-06-24 ENCOUNTER — Other Ambulatory Visit (HOSPITAL_COMMUNITY): Payer: No Typology Code available for payment source | Admitting: Psychiatry

## 2019-06-24 ENCOUNTER — Other Ambulatory Visit: Payer: Self-pay

## 2019-06-24 DIAGNOSIS — F3130 Bipolar disorder, current episode depressed, mild or moderate severity, unspecified: Secondary | ICD-10-CM

## 2019-06-24 DIAGNOSIS — F329 Major depressive disorder, single episode, unspecified: Secondary | ICD-10-CM | POA: Diagnosis not present

## 2019-06-24 NOTE — Progress Notes (Addendum)
Virtual Visit via Video Note  I connected with Sarah Cardenas on 06/24/19 at  9:00 AM EDT by a video enabled telemedicine application and verified that I am speaking with the correct person using two identifiers.  Location: Patient: Sarah Cardenas Provider: Lise Auer, LCSW   I discussed the limitations of evaluation and management by telemedicine and the availability of in person appointments. The patient expressed understanding and agreed to proceed.  History of Present Illness: Bipolar DO  Observations/Objective: Case Manager checked in with all participants to review discharge dates, insurance authorizations, work-related documents and needs for the treatment team. Counselor introduced guest speaker, Jeanella Craze, [place], to facilitate a discussion around Grief and Loss topics. Sarah Cardenas opened up about her families response to G&L and how that has negatively impacted her mental health. She identified that she suppresses her emotions and wants to get better at expressing and processing them to prevent mental health episodes. Counselor prompted the group to journal about the G&L issues they would like to further process with their individual therapist. Counselor facilitated a brief check in with group members to gauge mood and current functioning. Sarah Cardenas reports feeling well and able to fully engage today.  Counselor presented information on Resiliency and prompted group members to take a simple assessment to rate their resiliency. Group members shared their results and we processed the factors that promote their resiliency. Sarah Cardenas scored a 7 out of 14 for both childhood and now. Sarah Cardenas was called into work unexpectedly, so she was unable to participate in the Yoga practice.    Assessment and Plan: Counselor recommends that patient remains in IOP treatment to better manage mental health symptoms and continue to address treatment plan goals. Counselor recommends adherence to crisis/safety  plan, taking medications as prescribed and following up with medical professionals if any issues arise.   Follow Up Instructions: Counselor will send Webex link for next session.    I discussed the assessment and treatment plan with the patient. The patient was provided an opportunity to ask questions and all were answered. The patient agreed with the plan and demonstrated an understanding of the instructions.   The patient was advised to call back or seek an in-person evaluation if the symptoms worsen or if the condition fails to improve as anticipated.  I provided 120 minutes of non-face-to-face time during this encounter.   Lise Auer, LCSW

## 2019-06-25 ENCOUNTER — Other Ambulatory Visit: Payer: Self-pay

## 2019-06-25 ENCOUNTER — Other Ambulatory Visit (HOSPITAL_COMMUNITY): Payer: No Typology Code available for payment source | Admitting: Psychiatry

## 2019-06-25 ENCOUNTER — Encounter (HOSPITAL_COMMUNITY): Payer: Self-pay

## 2019-06-25 DIAGNOSIS — F329 Major depressive disorder, single episode, unspecified: Secondary | ICD-10-CM | POA: Diagnosis not present

## 2019-06-25 DIAGNOSIS — F3130 Bipolar disorder, current episode depressed, mild or moderate severity, unspecified: Secondary | ICD-10-CM

## 2019-06-25 NOTE — Progress Notes (Signed)
Virtual Visit via Video Note  I connected with Sarah Cardenas on 06/25/19 at  9:00 AM EDT by a video enabled telemedicine application and verified that I am speaking with the correct person using two identifiers.  Location: Patient: Sarah Cardenas Provider: Lise Auer, LCSW   I discussed the limitations of evaluation and management by telemedicine and the availability of in person appointments. The patient expressed understanding and agreed to proceed.  History of Present Illness: Bipolar 1 DO   Observations/Objective: Case Manager checked in with all participants to review discharge dates, insurance authorizations, work-related documents and needs for the treatment team. Counselor processed current mood and functioning and discussed how participants spent their time since last session and if skills were applied. Sarah Cardenas shared about an online dating experience that was frustrating for her. Others gave feedback and encouragement. Counselor prompted participants to journal about work-related stressors. Counselor gathered thoughts from group members to identify similarities in concerns. Group had an open discussion about ways they manage stress on the job. Sarah Cardenas shared that she has few stressors in the workplace, but discussed the language barrier and anger from employees when mistakes are made on payroll. Counselor shared 2 videos that address work-related stress management. Counselor received feedback from group members of how they could practically apply the skills in their places of work. Sarah Cardenas would like to implement utilizing a "to do list" more often to prioritize her day. Counselor provided the group with a self-assessment on vocational interests and encouraged them to complete for homework. Counselor acknowledged the progress made by one of the group members graduating from group today. Others shared encouraging words with the graduating member and he reflected on his time in treatment.    Assessment and Plan: Counselor recommends that patient remains in IOP treatment to better manage mental health symptoms and continue to address treatment plan goals. Counselor recommends adherence to crisis/safety plan, taking medications as prescribed and following up with medical professionals if any issues arise.   Follow Up Instructions: Counselor will send Webex link for next session.    I discussed the assessment and treatment plan with the patient. The patient was provided an opportunity to ask questions and all were answered. The patient agreed with the plan and demonstrated an understanding of the instructions.   The patient was advised to call back or seek an in-person evaluation if the symptoms worsen or if the condition fails to improve as anticipated.  I provided 180 minutes of non-face-to-face time during this encounter.   Lise Auer, LCSW

## 2019-06-29 ENCOUNTER — Other Ambulatory Visit (HOSPITAL_COMMUNITY): Payer: No Typology Code available for payment source | Admitting: Psychiatry

## 2019-06-29 ENCOUNTER — Encounter (HOSPITAL_COMMUNITY): Payer: Self-pay | Admitting: Family

## 2019-06-29 ENCOUNTER — Other Ambulatory Visit: Payer: Self-pay

## 2019-06-29 DIAGNOSIS — F3130 Bipolar disorder, current episode depressed, mild or moderate severity, unspecified: Secondary | ICD-10-CM

## 2019-06-29 DIAGNOSIS — F329 Major depressive disorder, single episode, unspecified: Secondary | ICD-10-CM | POA: Diagnosis not present

## 2019-06-29 DIAGNOSIS — F314 Bipolar disorder, current episode depressed, severe, without psychotic features: Secondary | ICD-10-CM

## 2019-06-29 NOTE — Progress Notes (Signed)
Virtual Visit via Telephone Note  I connected with Sarah Cardenas on 06/29/19 at  9:00 AM EDT by telephone and verified that I am speaking with the correct person using two identifiers.   I discussed the limitations, risks, security and privacy concerns of performing an evaluation and management service by telephone and the availability of in person appointments. I also discussed with the patient that there may be a patient responsible charge related to this service. The patient expressed understanding and agreed to proceed.   I discussed the assessment and treatment plan with the patient. The patient was provided an opportunity to ask questions and all were answered. The patient agreed with the plan and demonstrated an understanding of the instructions.   The patient was advised to call back or seek an in-person evaluation if the symptoms worsen or if the condition fails to improve as anticipated.  I provided 15 minutes of non-face-to-face time during this encounter.   Derrill Center, NP   [facility]ensive [facility] Program Discharge Summary  Sarah Cardenas [id number]  Admission date: 06/11/2019 Discharge date: 06/29/2019  Reason for admission: Per assessment note: Sarah Cardenas a 33 y.o. Caucasianfemale presents with worseningdepression andsuicidal ideations .Sarah Cardenas reported a recent discharge from [facility] for medication management mainly.She reported feeling better since her discharge. Reports a follow-up with her attending psychiatrist Landry Mellow. Patient is requesting medication samples of Vraylar that was provided at [facility]reported concerns with affording medications however states that she has a follow-up appointment in 3 days. Patient reports she continues to struggle with depression, anxiety poor concentration and intermittent suicidal ideations. Reports previous inpatient admission. Reports she has not been followed by  psychiatrist in a while. States she is currently prescribed reported history of substance abuse. Currently denying cravings or withdrawal symptoms. Reported she recently stopped drinking x 1 week ago.   Chemical Use History: Reported 2 months of sobriety since attending daily group sessions.  Reports intermittent craving however states family has been supportive and encouraging during this time.   Progress in Program Toward Treatment Goals: Ongoing, Sarah Cardenas has  attended and participated with daily group sessions with active and engauged  Participation. She recently completed partial hospitalization programming (PHP) and Intensive outpatient programming.(IOP)  She continues to present with mood irritability however reports overall feeling better since her admission.  Patient reports taking and tolerating medications well. Denies suicidal or homicidal ideations. Reported that her sleep hygiene has improved.   Support, encouragement and reassurance was provided. Denied medication refills at discharge.   Progress (rationale): Keep follow-up with Sarah Cardenas, LPC and Psychiatrist Sarah Cardenas  at [place] on 07/2019   -Patient to consider attending AA meeting  Take all medications as prescribed. Keep all follow-up appointments as scheduled.  Do not consume alcohol or use illegal drugs while on prescription medications. Report any adverse effects from your medications to your primary care provider promptly.  In the event of recurrent symptoms or worsening symptoms, call 911, a crisis hotline, or go to the nearest emergency department for evaluation.   Derrill Center, NP 06/29/2019

## 2019-06-29 NOTE — Patient Instructions (Signed)
D:  Patient completed MH-IOP today.  A:  Follow up with [facility] in October 2020 and Patty Hickman, [facility] 06-30-19 @ 9 a.m.  Encouraged support groups.  R:  Patient receptive.

## 2019-06-29 NOTE — Progress Notes (Signed)
Virtual Visit via Video Note  I connected with Sarah Cardenas on 06/29/19 at 0800 by a video enabled telemedicine application and verified that I am speaking with the correct person using two identifiers. I discussed the limitations of evaluation and management by telemedicine and the availability of in person appointments. The patient expressed understanding and agreed to proceed. I discussed the assessment and treatment plan with the patient. The patient was provided an opportunity to ask questions and all were answered. The patient agreed with the plan and demonstrated an understanding of the instructions.  The patient was advised to call back or seek an in-person evaluation if the symptoms worsen or if the condition fails to improve as anticipated.  I provided 20 minutes of non-face-to-face time during this encounter.   CLARK, RITA, M.Ed, CNA  As per previous CCA states: Pt reports to PHP per followup from inpt at [facility]. Pt was inpt due to suicide attempt by drinking and hanging. Pt reports she was inpt for 6 days. Pt reports 2 inpt stays prior: [facility] in Oct. 2018 for med management; when pt was 13 at [facility] for suicide attempt by cutting. Pt reports she was seeing Hal Neerebecca Austin for 6-7 years for counseling. Pt recently decided to switch therapists "because I got everything I could from her. I needed a new ear to listen."Pt started seeing Patty Hickman about 4 weeks ago. Pt reports she saw Starling MannsJoe Hughes at [facility] for 6-7 years but wants to switch psychiatrists at this time. Pt reports her depression had become increasingly worse for 6 weeks prior to attempt and had symptoms including lack of motivation, anhedonia, and decreased ADLS- not showering for weeks, not leaving the couch, not cooking or cleaning. 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 Pt reports she was drinking 1 bottle of wine 2-3x a week prior to inpt stay. Pt has committed to not drinking and feels it is not a primary focus of her mental health treatment because she knows she was using it as a coping mechanism. Pt denies any withdrawal symptoms or cravings; reports she hasn't had anything to drink since attempt on 7/24. Pt reports continued passive SI, denies intent/plan. Pt denies HI/AVH. Patients Currently Reported Symptoms/Problems: Pt reports recent 6 day inpt stay for suicide attempt by hanging; increased depression and anxiety; continued passive SI; lack of motivation; decreased ADLS- showering 1x a week at max, not cooking or cleaning, pt reports rarely getting off the couch; lack of energy; anhedonia; feelings of hopelessness/worthlessness; decreased sleep- can't stay asleep; mood swings; irritability; excessive worrying; feelings of loneliness; work problems- suspension from work; poor concentration  As per admit note:  Pt transitioned to MH-IOP today.  States she will need to log off to meet with her psychiatrist (which was previously scheduled). Pt was in MH-IOP previously with writer (December 2018). Pt denies SI/HI or A/V hallucinations today.    Pt completed all scheduled days in MH-IOP.  Reports overall mood improved.  [place].  "I've learned a  lot in group, got a lot of advice; it was a positive experience."  Denies any depressive/anxiety symptoms.  Denies SI/HI or A/V hallucinations.  A:  D/C today.  F/U with [facility] in October 2020 and Patty Hickman, [place] on 06-30-19 @ 9 a.m.  Encouraged support groups.  R:  Pt receptive.

## 2019-06-30 ENCOUNTER — Other Ambulatory Visit (HOSPITAL_COMMUNITY): Payer: No Typology Code available for payment source

## 2019-06-30 ENCOUNTER — Encounter (HOSPITAL_COMMUNITY): Payer: Self-pay | Admitting: Psychiatry

## 2019-06-30 ENCOUNTER — Other Ambulatory Visit: Payer: Self-pay

## 2019-06-30 NOTE — Progress Notes (Signed)
Virtual Visit via Video Note  I connected with Sarah Cardenas on 06/30/19 at  9:00 AM EDT by a video enabled telemedicine application and verified that I am speaking with the correct person using two identifiers.  Location: Patient: Sarah Cardenas Provider: Lise Auer, LCSW   I discussed the limitations of evaluation and management by telemedicine and the availability of in person appointments. The patient expressed understanding and agreed to proceed.  History of Present Illness: Bipolar 1   Observations/Objective: Case Manager checked in with all participants to review discharge dates, insurance authorizations, work-related documents and needs for the treatment team. Counselor processed current mood and functioning and discussed how participants spent their time since last session and if skills were applied. [name] shared that she had a good weekend with family, stretching herself to get out of her [place] and [place] with [place]. [name] reported being nervous and excited about ending group and starting back work full time. [name] was in a great mood and proud of the work she has completed over the past 2 months of treatment. Group members opened up a discussion on how "self-medicating" and using substances or other unhealthy habits to cope with mental health. Group members shared experiences and how it has negatively impacted their progress over time. We discussed resources and programs they can access if they need more help in this area. 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 Counselor walked through an exercise called my "My Ecomap" with the group members prompting them to chart people and intities they are connected with, the dynamics of the relationships and where they are focusing their energy. Group members shared each of there ecomaps with the group. [name] had several conflicting relationships of good and stress. She highlighted that she has an excellent friend  group who supports her in many ways. Counselor encouraged group members to make goals related to boundaries, self-care and communication to address problem areas on their charts. [name] would like to get more connected with her church and faith again. Counselor acknowledged the progress made by [name], who is graduating from group today. Others shared encouraging words with the her and she was very appreciative. She reported feeling very equipped to return back to work and her life outside of treatment.    Assessment and Plan: Counselor recommends that patient completes in IOP treatment to step down to individual therapy and medication management to continue management of  mental health symptoms and continue to address treatment plan goals. Counselor recommends adherence to crisis/safety plan, taking medications as prescribed and following up with medical professionals if any issues arise.   Follow Up Instructions: Counselor will send Webex link for next session.    I discussed the assessment and treatment plan with the patient. The patient was provided an opportunity to ask questions and all were answered. The patient agreed with the plan and demonstrated an understanding of the instructions.   The patient was advised to call back or seek an in-person evaluation if the symptoms worsen or if the condition fails to improve as anticipated.  I provided 180 minutes of non-face-to-face time during this encounter.   Lise Auer, LCSW

## 2019-07-01 ENCOUNTER — Ambulatory Visit (HOSPITAL_COMMUNITY): Payer: No Typology Code available for payment source

## 2019-07-02 ENCOUNTER — Ambulatory Visit (HOSPITAL_COMMUNITY): Payer: No Typology Code available for payment source

## 2019-07-05 ENCOUNTER — Ambulatory Visit (HOSPITAL_COMMUNITY): Payer: No Typology Code available for payment source

## 2019-07-06 ENCOUNTER — Ambulatory Visit (HOSPITAL_COMMUNITY): Payer: No Typology Code available for payment source

## 2019-07-07 ENCOUNTER — Ambulatory Visit (HOSPITAL_COMMUNITY): Payer: No Typology Code available for payment source

## 2019-07-08 ENCOUNTER — Ambulatory Visit (HOSPITAL_COMMUNITY): Payer: No Typology Code available for payment source

## 2019-07-09 ENCOUNTER — Ambulatory Visit (HOSPITAL_COMMUNITY): Payer: No Typology Code available for payment source

## 2019-07-15 ENCOUNTER — Encounter: Payer: Self-pay | Admitting: Physician Assistant

## 2019-07-15 ENCOUNTER — Telehealth: Payer: Self-pay | Admitting: Physician Assistant

## 2019-07-15 DIAGNOSIS — R05 Cough: Secondary | ICD-10-CM

## 2019-07-15 DIAGNOSIS — R0602 Shortness of breath: Secondary | ICD-10-CM

## 2019-07-15 DIAGNOSIS — R059 Cough, unspecified: Secondary | ICD-10-CM

## 2019-07-15 NOTE — Progress Notes (Signed)
I am concerned about the shortness of breath you are having when you are completing your regular activities. Based on what you shared with me, I feel your condition warrants further evaluation. I recommend that you be seen for a face to face office visit as soon as possible  NOTE: If you entered your credit card information for this eVisit, you will not be charged. You may see a "hold" on your card for the $35 but that hold will drop off and you will not have a charge processed.  If you are having a true medical emergency please call 911.     For an urgent face to face visit, [place] has four urgent care centers for your convenience:   . [facility]    [phone]                  Get Driving Directions  [address], [place] [address] . 10 am to 8 pm Monday-Friday . 12 pm to 8 pm Saturday-Sunday   . [facility] at [place]                  Get Driving Directions  [address], [place], [place] [address] . 8 am to 8 pm Monday-Friday . 9 am to 6 pm Saturday . 11 am to 6 pm Sunday   . [facility] at [place]                  Get Driving Directions   [address] [place], [place] [address] . 8 am to 8 pm Monday-Friday . 8 am to 4 pm Saturday-Sunday    . [facility] at [place]                    Get Driving Directions  [phone]  [address], [place], [place] [address]  . Monday-Friday, 12 PM to 6 PM    Your e-visit answers were reviewed by a board certified advanced clinical practitioner to complete your personal care plan.  Thank you for using e-Visits.  Approximately 5 minutes was spent documenting and reviewing patient's chart.

## 2019-07-15 NOTE — Telephone Encounter (Signed)
Please call pt and schedule a  Doxy visit.

## 2019-07-21 ENCOUNTER — Telehealth: Payer: No Typology Code available for payment source | Admitting: Family

## 2019-07-21 DIAGNOSIS — M545 Low back pain, unspecified: Secondary | ICD-10-CM

## 2019-07-21 DIAGNOSIS — R109 Unspecified abdominal pain: Secondary | ICD-10-CM

## 2019-07-21 DIAGNOSIS — N898 Other specified noninflammatory disorders of vagina: Secondary | ICD-10-CM

## 2019-07-21 NOTE — Progress Notes (Signed)
Based on what you shared with me, I feel your condition warrants further evaluation and I recommend that you be seen for a face to face office visit.  Given you are having abdomen and back pain with vaginal discharge, you need to be seen face to face to rule out a more serious infection.   NOTE: If you entered your credit card information for this eVisit, you will not be charged. You may see a "hold" on your card for the $35 but that hold will drop off and you will not have a charge processed.  If you are having a true medical emergency please call 911.     For an urgent face to face visit, [facility] has four urgent care centers for your convenience:   . [facility]    [phone]                  Get Driving Directions  [address], [place] [address] . 10 am to 8 pm Monday-Friday . 12 pm to 8 pm Saturday-Sunday   . [facility] at [place]                  Get Driving Directions  [address], [place], [place] [address] . 8 am to 8 pm Monday-Friday . 9 am to 6 pm Saturday . 11 am to 6 pm Sunday   . [facility] at [place]                  Get Driving Directions   [address] [place], [place] [address] . 8 am to 8 pm Monday-Friday . 8 am to 4 pm Saturday-Sunday    . [facility] at [place]                    Get Driving Directions  [phone]  [address], [place], [place] [address]  . Monday-Friday, 12 PM to 6 PM    Your e-visit answers were reviewed by a board certified advanced clinical practitioner to complete your personal care plan.  Thank you for using e-Visits.

## 2019-07-22 MED ORDER — KETOCONAZOLE 2 % EX CREA: 1.0000 "application " | TOPICAL_CREAM | Freq: Every day | CUTANEOUS | 0 refills | Status: DC

## 2019-07-22 NOTE — Addendum Note (Signed)
Addended by: Margarita Mail on: 07/22/2019 10:16 AM   Modules accepted: Orders

## 2019-08-09 ENCOUNTER — Encounter: Payer: Self-pay | Admitting: Physician Assistant

## 2019-08-09 ENCOUNTER — Other Ambulatory Visit: Payer: Self-pay

## 2019-08-09 ENCOUNTER — Ambulatory Visit (INDEPENDENT_AMBULATORY_CARE_PROVIDER_SITE_OTHER): Payer: No Typology Code available for payment source | Admitting: Physician Assistant

## 2019-08-09 DIAGNOSIS — R6889 Other general symptoms and signs: Secondary | ICD-10-CM

## 2019-08-09 DIAGNOSIS — Z7189 Other specified counseling: Secondary | ICD-10-CM | POA: Diagnosis not present

## 2019-08-09 MED ORDER — HYDROCOD POLST-CPM POLST ER 10-8 MG/5ML PO SUER: 5.0000 mL | Freq: Every evening | ORAL | 0 refills | Status: DC | PRN

## 2019-08-09 MED ORDER — AZITHROMYCIN 250 MG PO TABS: ORAL_TABLET | ORAL | 0 refills | Status: DC

## 2019-08-09 NOTE — Progress Notes (Signed)
Virtual Visit via Video   I connected with Sarah Cardenas on 08/09/19 at 11:40 AM EDT by a video enabled telemedicine application and verified that I am speaking with the correct person using two identifiers. Location patient: Home Location provider: [facility], Office Persons participating in the virtual visit: Jeanifer Krysta [place], [name] PA-C, Corky Mull, LPN   I discussed the limitations of evaluation and management by telemedicine and the availability of in person appointments. The patient expressed understanding and agreed to proceed.  I acted as a Neurosurgeon for [employer], PA-C [employer], LPN  Subjective:   HPI:   Patient is requesting evaluation for possible COVID-19.  Symptom onset: Today  Travel or Contacts: No expose that pt is aware of. Went to work today but started having worsening symptoms so was sent home.  Patient endorses the following symptoms: Fever >100.64F []   Yes [x]   No []   Unknown Subjective fever (felt feverish) [x]   Yes []   No []   Unknown Chills [x]   Yes []   No []   Unknown Muscle aches (myalgia) [x]   Yes []   No []   Unknown Runny nose (rhinorrhea) []   Yes [x]   No []   Unknown Sore throat []   Yes [x]   No []   Unknown Cough (new onset or worsening of chronic cough) [x]   Yes []   No []   Unknown Shortness of breath (dyspnea) []   Yes [x]   No []   Unknown Nausea or vomiting []   Yes [x]   No []   Unknown Headache []   Yes [x]   No []   Unknown Abdominal pain  []   Yes [x]   No []   Unknown Diarrhea (?3 loose/looser than normal stools/24hr period) [x]   Yes []   No []   Unknown  Other, specify: Right ear pain, Temperature today 99.   Treatments tried: tessalon perles   Patient risk factors: Smoker? [x]   Current []   Former []   Never If female, currently pregnant? []   Yes [x]   No  ROS: See pertinent positives and negatives per HPI.  Patient Active Problem List   Diagnosis Date Noted   Bipolar 1 disorder, depressed, severe (HCC) 05/26/2019    Cervical intraepithelial neoplasia grade 1 12/07/2018    Social History   Tobacco Use   Smoking status: Current Some Day Smoker    Packs/day: 0.50    Years: 10.00    Pack years: 5.00    Types: Cigarettes   Smokeless tobacco: Never Used  Substance Use Topics   Alcohol use: Yes    Comment: occ    Current Outpatient Medications:    azelastine (ASTELIN) 0.1 % nasal spray, Place 2 sprays into both nostrils 2 (two) times daily. Use in each nostril as directed, Disp: 30 mL, Rfl: 12   benzonatate (TESSALON) 200 MG capsule, Take 1 capsule (200 mg total) by mouth 2 (two) times daily as needed for cough., Disp: 20 capsule, Rfl: 0   hydrOXYzine (ATARAX/VISTARIL) 25 MG tablet, Take 25 mg by mouth 2 (two) times daily at 8 am and 10 pm. , Disp: , Rfl:    ibuprofen (ADVIL,MOTRIN) 200 MG tablet, Take 400 mg by mouth every 6 (six) hours as needed., Disp: , Rfl:    lamoTRIgine (LAMICTAL) 150 MG tablet, Take 150 mg by mouth 2 (two) times daily., Disp: , Rfl:    lurasidone (LATUDA) 80 MG TABS tablet, Take 80 mg by mouth daily with breakfast., Disp: , Rfl:    norgestimate-ethinyl estradiol (ESTARYLLA) 0.25-35 MG-MCG tablet, Estarylla 0.25 mg-35 mcg tablet, Disp: ,  Rfl:    prazosin (MINIPRESS) 2 MG capsule, TK 2 CS PO QHS, Disp: , Rfl:    QUEtiapine (SEROQUEL) 25 MG tablet, Take 1 tablet (25 mg total) by mouth 2 (two) times daily., Disp: 60 tablet, Rfl: 0   sertraline (ZOLOFT) 100 MG tablet, TK 1 T PO QAM, Disp: , Rfl:    tiZANidine (ZANAFLEX) 2 MG tablet, Take 1 tablet (2 mg total) by mouth every 8 (eight) hours as needed for muscle spasms., Disp: 12 tablet, Rfl: 0   traZODone (DESYREL) 50 MG tablet, Take 50 mg by mouth at bedtime as needed for sleep., Disp: , Rfl:    VENTOLIN HFA 108 (90 Base) MCG/ACT inhaler, INL 2 PFS ITL Q 4 H FOR UP TO 10 DAYS PRF WHZ OR SOB, Disp: , Rfl:    VRAYLAR capsule, Take 1.5 mg by mouth every morning., Disp: , Rfl:    azithromycin (ZITHROMAX) 250 MG tablet,  Take two tablets on day 1, then one tablet daily x 4 days, Disp: 6 tablet, Rfl: 0   chlorpheniramine-HYDROcodone (TUSSIONEX PENNKINETIC ER) 10-8 MG/5ML SUER, Take 5 mLs by mouth at bedtime as needed for cough., Disp: 60 mL, Rfl: 0  Allergies  Allergen Reactions   Ciprofloxacin Other (See Comments)    numbness    Objective:   VITALS: Per patient if applicable, see vitals. GENERAL: Alert, appears well and in no acute distress. HEENT: Atraumatic, conjunctiva clear, no obvious abnormalities on inspection of external nose and ears. NECK: Normal movements of the head and neck. CARDIOPULMONARY: No increased WOB. Speaking in clear sentences. I:E ratio WNL.  MS: Moves all visible extremities without noticeable abnormality. PSYCH: Pleasant and cooperative, well-groomed. Speech normal rate and rhythm. Affect is appropriate. Insight and judgement are appropriate. Attention is focused, linear, and appropriate.  NEURO: CN grossly intact. Oriented as arrived to appointment on time with no prompting. Moves both UE equally.  SKIN: No obvious lesions, wounds, erythema, or cyanosis noted on face or hands.  Assessment and Plan:   Sarah Cardenas was seen today for covid symptoms.  Diagnoses and all orders for this visit:  Flu-like symptoms; Advice given about COVID-19 virus infection Patient has a respiratory illness without signs of acute distress or respiratory compromise at this time. This is likely a viral infection, which can come from a number of respiratory viruses. She is scheduled to get rapid testing tomorrow, she states that she will inform us of these results. As a precaution, they have been advised to remain home until COVID-19 results and then possible further quarantine after that based on results and symptoms. Advised if they experience a "second sickening" or worsening symptoms as the illness progresses, they are to call the office for further instructions or seek emergent evaluation for any severe  symptoms.   *I did give her a pocket prescription of a z-pack to use if ear pain symptoms worsen. Also recommended delsym cough syrup during the day, and did give script for tussionex at night prn.  Other orders -     azithromycin (ZITHROMAX) 250 MG tablet; Take two tablets on day 1, then one tablet daily x 4 days -     chlorpheniramine-HYDROcodone (TUSSIONEX PENNKINETIC ER) 10-8 MG/5ML SUER; Take 5 mLs by mouth at bedtime as needed for cough.    Reviewed expectations re: course of current medical issues.  Discussed self-management of symptoms.  Outlined signs and symptoms indicating need for more acute intervention.  Patient verbalized understanding and all questions were answered.  Health Maintenance  issues including appropriate healthy diet, exercise, and smoking avoidance were discussed with patient.  See orders for this visit as documented in the electronic medical record.  I discussed the assessment and treatment plan with the patient. The patient was provided an opportunity to ask questions and all were answered. The patient agreed with the plan and demonstrated an understanding of the instructions.   The patient was advised to call back or seek an in-person evaluation if the symptoms worsen or if the condition fails to improve as anticipated.   CMA or LPN served as scribe during this visit. History, Physical, and Plan performed by medical provider. The above documentation has been reviewed and is accurate and complete.   [place] CitySamantha Carlos Quack[place], GeorgiaPA 08/09/2019

## 2019-09-23 ENCOUNTER — Telehealth: Payer: No Typology Code available for payment source | Admitting: Physician Assistant

## 2019-09-23 DIAGNOSIS — R05 Cough: Secondary | ICD-10-CM | POA: Diagnosis not present

## 2019-09-23 DIAGNOSIS — R059 Cough, unspecified: Secondary | ICD-10-CM

## 2019-09-23 MED ORDER — ALBUTEROL SULFATE HFA 108 (90 BASE) MCG/ACT IN AERS: 2.0000 | INHALATION_SPRAY | Freq: Four times a day (QID) | RESPIRATORY_TRACT | 0 refills | Status: DC | PRN

## 2019-09-23 MED ORDER — DEXTROMETHORPHAN HBR 15 MG/5ML PO SYRP: 10.0000 mL | ORAL_SOLUTION | Freq: Four times a day (QID) | ORAL | 0 refills | Status: DC | PRN

## 2019-09-23 MED ORDER — PREDNISONE 10 MG PO TABS: ORAL_TABLET | ORAL | 0 refills | Status: DC

## 2019-09-23 NOTE — Progress Notes (Signed)
We are sorry that you are not feeling well.  Here is how we plan to help!  Based on your presentation I believe you most likely have A cough due to a virus.  This is called viral bronchitis and is best treated by rest, plenty of fluids and control of the cough.  You may use Ibuprofen or Tylenol as directed to help your symptoms.     In addition you may use A non-prescription cough medication called Robitussin DAC. Take 2 teaspoons every 8 hours or Delsym: take 2 teaspoons every 12 hours.  Prednisone 60 mg daily for one day, followed by 40 mg daily for 4 days. Take this with meals.  You may also find it helpful to take an allergy medicine at night such as benadryl, as this can help decrease secretions that can cause cough.     Please seek immediate evaluation if you develop shortness of breath no related to coughing fits, fevers, chest pain, persistent vomiting, or loss of consciousness. You may want to consider getting tested for COVID-19 as well.     From your responses in the eVisit questionnaire you describe inflammation in the upper respiratory tract which is causing a significant cough.  This is commonly called Bronchitis and has four common causes:    Allergies  Viral Infections  Acid Reflux  Bacterial Infection Allergies, viruses and acid reflux are treated by controlling symptoms or eliminating the cause. An example might be a cough caused by taking certain blood pressure medications. You stop the cough by changing the medication. Another example might be a cough caused by acid reflux. Controlling the reflux helps control the cough.  USE OF BRONCHODILATOR ("RESCUE") INHALERS: There is a risk from using your bronchodilator too frequently.  The risk is that over-reliance on a medication which only relaxes the muscles surrounding the breathing tubes can reduce the effectiveness of medications prescribed to reduce swelling and congestion of the tubes themselves.  Although you feel  brief relief from the bronchodilator inhaler, your asthma may actually be worsening with the tubes becoming more swollen and filled with mucus.  This can delay other crucial treatments, such as oral steroid medications. If you need to use a bronchodilator inhaler daily, several times per day, you should discuss this with your provider.  There are probably better treatments that could be used to keep your asthma under control.     HOME CARE . Only take medications as instructed by your medical team. . Complete the entire course of an antibiotic. . Drink plenty of fluids and get plenty of rest. . Avoid close contacts especially the very young and the elderly . Cover your mouth if you cough or cough into your sleeve. . Always remember to wash your hands . A steam or ultrasonic humidifier can help congestion.   GET HELP RIGHT AWAY IF: . You develop worsening fever. . You become short of breath . You cough up blood. . Your symptoms persist after you have completed your treatment plan MAKE SURE YOU   Understand these instructions.  Will watch your condition.  Will get help right away if you are not doing well or get worse.  Your e-visit answers were reviewed by a board certified advanced clinical practitioner to complete your personal care plan.  Depending on the condition, your plan could have included both over the counter or prescription medications. If there is a problem please reply  once you have received a response from your provider. Your safety is  important to [place].  If you have drug allergies check your prescription carefully.    You can use MyChart to ask questions about today's visit, request a non-urgent call back, or ask for a work or school excuse for 24 hours related to this e-Visit. If it has been greater than 24 hours you will need to follow up with your provider, or enter a new e-Visit to address those concerns. You will get an e-mail in the next two days asking about your  experience.  I hope that your e-visit has been valuable and will speed your recovery. Thank you for using e-visits.  Greater than 5 minutes, yet less than 10 minutes of time have been spent researching, coordinating, and implementing care for this patient today.

## 2019-09-23 NOTE — Addendum Note (Signed)
Addended by: Rodell Perna A on: 09/23/2019 08:53 AM   Modules accepted: Orders

## 2019-10-07 ENCOUNTER — Ambulatory Visit: Payer: No Typology Code available for payment source | Admitting: Physician Assistant

## 2019-10-07 ENCOUNTER — Encounter: Payer: Self-pay | Admitting: Physician Assistant

## 2019-10-07 ENCOUNTER — Telehealth: Payer: Self-pay | Admitting: *Deleted

## 2019-10-07 ENCOUNTER — Telehealth: Payer: No Typology Code available for payment source | Admitting: Physician Assistant

## 2019-10-07 DIAGNOSIS — R058 Other specified cough: Secondary | ICD-10-CM

## 2019-10-07 DIAGNOSIS — R05 Cough: Secondary | ICD-10-CM

## 2019-10-07 DIAGNOSIS — R062 Wheezing: Secondary | ICD-10-CM

## 2019-10-07 NOTE — Telephone Encounter (Signed)
Spoke to pt told her Aldona Bar reviewed her chart and she wants you to go to an Urgent care to be evaluated so you can get a chest xray if needed. Told her need to have a good exam in person. Pt verbalized understanding.

## 2019-10-07 NOTE — Telephone Encounter (Signed)
Pt called back and scheduled for today

## 2019-10-07 NOTE — Progress Notes (Signed)
  E-Visit for [employer] Virus Screening  Based on what you have shared with me, you need to seek an evaluation for a severe illness that is causing your symptoms which may be coronavirus or some other illness. I recommend that you be seen and evaluated "face to face". If you are considered high risk for Corona virus because of a known exposure, fever, shortness of breath and cough, OR if you have severe symptoms of any kind, seek medical care at an emergency room. Our Emergency Departments are best equipped to handle patients with severe symptoms.  You will be evaluated by the ER provider (or higher level of care provider) who will determine whether you need formal testing.  If you are having a true medical emergency please call 911.   I recommend the following:  . [facility] Emergency Department [place], [place], [place] [address] [phone]  . [facility] Emergency Department [place], [place], [place] [address] [phone]  . [facility] Emergency Department [place], [place], [place] [address] [phone]  . [facility] Emergency Department [address] [place], [place], [place] [address] [phone]  . [facility] Emergency Department [place], [place], [place] [address] [phone]  NOTE: If you entered your credit card information for this eVisit, you will not be charged. You may see a "hold" on your card for the $35 but that hold will drop off and you will not have a charge processed.   Your e-visit answers were reviewed by a board certified advanced clinical practitioner to complete your personal care plan.  Thank you for using e-Visits.  Particia Nearing PA-C   Approximately 5 minutes was spent documenting and reviewing patient's chart.

## 2019-10-07 NOTE — Telephone Encounter (Signed)
Left message on personal voicemail need to schedule virtual visit to be evaluated. Please call the office.

## 2019-10-31 ENCOUNTER — Telehealth: Payer: No Typology Code available for payment source | Admitting: Family

## 2019-10-31 DIAGNOSIS — J069 Acute upper respiratory infection, unspecified: Secondary | ICD-10-CM

## 2019-10-31 MED ORDER — BENZONATATE 100 MG PO CAPS: 100.0000 mg | ORAL_CAPSULE | Freq: Three times a day (TID) | ORAL | 0 refills | Status: DC | PRN

## 2019-10-31 MED ORDER — FLUTICASONE PROPIONATE 50 MCG/ACT NA SUSP: 2.0000 | Freq: Every day | NASAL | 6 refills | Status: DC

## 2019-10-31 NOTE — Progress Notes (Signed)
We are sorry you are not feeling well.  Here is how we plan to help!  Based on what you have shared with me, it looks like you may have a viral upper respiratory infection.  Upper respiratory infections are caused by a large number of viruses; however, rhinovirus is the most common cause.   Symptoms vary from person to person, with common symptoms including sore throat, cough, fatigue or lack of energy and feeling of general discomfort.  A low-grade fever of up to 100.4 may present, but is often uncommon.  Symptoms vary however, and are closely related to a person's age or underlying illnesses.  The most common symptoms associated with an upper respiratory infection are nasal discharge or congestion, cough, sneezing, headache and pressure in the ears and face.  These symptoms usually persist for about 3 to 10 days, but can last up to 2 weeks.  It is important to know that upper respiratory infections do not cause serious illness or complications in most cases.    Upper respiratory infections can be transmitted from person to person, with the most common method of transmission being a person's hands.  The virus is able to live on the skin and can infect other persons for up to 2 hours after direct contact.  Also, these can be transmitted when someone coughs or sneezes; thus, it is important to cover the mouth to reduce this risk.  To keep the spread of the illness at [place], good hand hygiene is very important.  This is an infection that is most likely caused by a virus. There are no specific treatments other than to help you with the symptoms until the infection runs its course.  We are sorry you are not feeling well.  Here is how we plan to help!   For nasal congestion, you may use an oral decongestants such as Mucinex D or if you have glaucoma or high blood pressure use plain Mucinex.  Saline nasal spray or nasal drops can help and can safely be used as often as needed for congestion.  For your congestion,  I have prescribed Fluticasone nasal spray one spray in each nostril twice a day  If you do not have a history of heart disease, hypertension, diabetes or thyroid disease, prostate/bladder issues or glaucoma, you may also use Sudafed to treat nasal congestion.  It is highly recommended that you consult with a pharmacist or your primary care physician to ensure this medication is safe for you to take.     If you have a cough, you may use cough suppressants such as Delsym and Robitussin.  If you have glaucoma or high blood pressure, you can also use Coricidin HBP.   For cough I have prescribed for you A prescription cough medication called Tessalon Perles 100 mg. You may take 1-2 capsules every 8 hours as needed for cough.   Given your symptoms, you also need to be tested for COVID.   If you have a sore or scratchy throat, use a saltwater gargle-  to  teaspoon of salt dissolved in a 4-ounce to 8-ounce glass of warm water.  Gargle the solution for approximately 15-30 seconds and then spit.  It is important not to swallow the solution.  You can also use throat lozenges/cough drops and Chloraseptic spray to help with throat pain or discomfort.  Warm or cold liquids can also be helpful in relieving throat pain.  For headache, pain or general discomfort, you can use Ibuprofen or Tylenol  as directed.   Some authorities believe that zinc sprays or the use of Echinacea may shorten the course of your symptoms.   HOME CARE . Only take medications as instructed by your medical team. . Be sure to drink plenty of fluids. Water is fine as well as fruit juices, sodas and electrolyte beverages. You may want to stay away from caffeine or alcohol. If you are nauseated, try taking small sips of liquids. How do you know if you are getting enough fluid? Your urine should be a pale yellow or almost colorless. . Get rest. . Taking a steamy shower or using a humidifier may help nasal congestion and ease sore throat pain.  You can place a towel over your head and breathe in the steam from hot water coming from a faucet. . Using a saline nasal spray works much the same way. . Cough drops, hard candies and sore throat lozenges may ease your cough. . Avoid close contacts especially the very young and the elderly . Cover your mouth if you cough or sneeze . Always remember to wash your hands.   GET HELP RIGHT AWAY IF: . You develop worsening fever. . If your symptoms do not improve within 10 days . You develop yellow or green discharge from your nose over 3 days. . You have coughing fits . You develop a severe head ache or visual changes. . You develop shortness of breath, difficulty breathing or start having chest pain . Your symptoms persist after you have completed your treatment plan  MAKE SURE YOU   Understand these instructions.  Will watch your condition.  Will get help right away if you are not doing well or get worse.  Your e-visit answers were reviewed by a board certified advanced clinical practitioner to complete your personal care plan. Depending upon the condition, your plan could have included both over the counter or prescription medications. Please review your pharmacy choice. If there is a problem, you may call our nursing hot line at and have the prescription routed to another pharmacy. Your safety is important to [place]. If you have drug allergies check your prescription carefully.   You can use MyChart to ask questions about today's visit, request a non-urgent call back, or ask for a work or school excuse for 24 hours related to this e-Visit. If it has been greater than 24 hours you will need to follow up with your provider, or enter a new e-Visit to address those concerns. You will get an e-mail in the next two days asking about your experience.  I hope that your e-visit has been valuable and will speed your recovery. Thank you for using e-visits.    Approximately 5 minutes was spent  documenting and reviewing patient's chart.

## 2019-11-15 ENCOUNTER — Encounter: Payer: Self-pay | Admitting: Physician Assistant

## 2019-11-15 NOTE — Telephone Encounter (Signed)
Pt called back, asked pt is she is constipated? Pt said no, it was a lot the first time and bright red and having bleeding every time when wiping. Asked pt if having any dizziness or pain?  Pt said no. Told her Lelon Mast would like you to come into the office tomorrow. I have a 1:40 appt. Pt said that is fine. Appt scheduled, told her dizziness or excessive bright red bleeding needs to go to the ED. Pt verbalized understanding.

## 2019-11-15 NOTE — Telephone Encounter (Signed)
Left message on voicemail to call office.  

## 2019-11-16 ENCOUNTER — Ambulatory Visit (INDEPENDENT_AMBULATORY_CARE_PROVIDER_SITE_OTHER): Payer: No Typology Code available for payment source | Admitting: Physician Assistant

## 2019-11-16 ENCOUNTER — Encounter: Payer: Self-pay | Admitting: Physician Assistant

## 2019-11-16 VITALS — BP 118/80 | HR 89 | Temp 98.2°F | Ht 64.0 in | Wt 277.2 lb

## 2019-11-16 DIAGNOSIS — R61 Generalized hyperhidrosis: Secondary | ICD-10-CM

## 2019-11-16 DIAGNOSIS — K625 Hemorrhage of anus and rectum: Secondary | ICD-10-CM | POA: Diagnosis not present

## 2019-11-16 LAB — COMPREHENSIVE METABOLIC PANEL
ALT: 15 U/L (ref 0–35)
AST: 18 U/L (ref 0–37)
Albumin: 4.4 g/dL (ref 3.5–5.2)
Alkaline Phosphatase: 84 U/L (ref 39–117)
BUN: 12 mg/dL (ref 6–23)
CO2: 27 mEq/L (ref 19–32)
Calcium: 9.6 mg/dL (ref 8.4–10.5)
Chloride: 99 mEq/L (ref 96–112)
Creatinine, Ser: 1.16 mg/dL (ref 0.40–1.20)
GFR: 53.26 mL/min — ABNORMAL LOW (ref >60.00)
Glucose, Bld: 93 mg/dL (ref 70–99)
Potassium: 4.6 mEq/L (ref 3.5–5.1)
Sodium: 135 mEq/L (ref 135–145)
Total Bilirubin: 0.3 mg/dL (ref 0.2–1.2)
Total Protein: 6.7 g/dL (ref 6.0–8.3)

## 2019-11-16 LAB — CBC WITH DIFFERENTIAL/PLATELET
Basophils Absolute: 0.1 10*3/uL (ref 0.0–0.1)
Basophils Relative: 0.6 % (ref 0.0–3.0)
Eosinophils Absolute: 0.3 10*3/uL (ref 0.0–0.7)
Eosinophils Relative: 3.1 % (ref 0.0–5.0)
HCT: 38.9 % (ref 36.0–46.0)
Hemoglobin: 12.6 g/dL (ref 12.0–15.0)
Lymphocytes Relative: 37.1 % (ref 12.0–46.0)
Lymphs Abs: 3.8 10*3/uL (ref 0.7–4.0)
MCHC: 32.5 g/dL (ref 30.0–36.0)
MCV: 80.8 fl (ref 78.0–100.0)
Monocytes Absolute: 0.6 10*3/uL (ref 0.1–1.0)
Monocytes Relative: 5.8 % (ref 3.0–12.0)
Neutro Abs: 5.4 10*3/uL (ref 1.4–7.7)
Neutrophils Relative %: 53.4 % (ref 43.0–77.0)
Platelets: 277 10*3/uL (ref 150.0–400.0)
RBC: 4.82 Mil/uL (ref 3.87–5.11)
RDW: 15.1 % (ref 11.5–15.5)
WBC: 10.2 10*3/uL (ref 4.0–10.5)

## 2019-11-16 NOTE — Patient Instructions (Signed)
It was great to see you!  You will be contacted about your referral to GI and about your lab results.  Take care,  [name] PA-C

## 2019-11-16 NOTE — Progress Notes (Signed)
Sarah Cardenas is a 34 y.o. female here for a new problem.  I acted as a Neurosurgeon for [employer], PA-C Corky Mull, LPN  History of Present Illness:   Chief Complaint  Patient presents with  . Rectal Bleeding    HPI    Rectal bleeding Pt c/o a lot of bright red rectal bleeding on 01/24 after moving bowels; had a "gush of blood with clots", and every time she moves her bowels since episode there is blood with wiping.   Diarrhea is normal for her. Normal for her to go daily. Does endorse new night sweats, wakes up sweaty. Happens nightly for the past week. Denies unintentional weight loss. No fevers. No strange food consumption. No dysuria. Denies anal sex. Denies any food intolerances/allergies. No pain in abdomen.  No family hx of IBD, IBS, cancer.   Past Medical History:  Diagnosis Date  . Anxiety   . Bipolar affect, depressed (HCC)   . Depression      Social History   Socioeconomic History  . Marital status: Single    Spouse name: Not on file  . Number of children: Not on file  . Years of education: Not on file  . Highest education level: Not on file  Occupational History  . Not on file  Tobacco Use  . Smoking status: Current Some Day Smoker    Packs/day: 0.50    Years: 10.00    Pack years: 5.00    Types: Cigarettes  . Smokeless tobacco: Never Used  Substance and Sexual Activity  . Alcohol use: Yes    Comment: occ  . Drug use: No  . Sexual activity: Not Currently    Birth control/protection: Pill  Other Topics Concern  . Not on file  Social History Narrative   HR x 6 years   Single, no children   Social Determinants of Health   Financial Resource Strain:   . Difficulty of Paying Living Expenses: Not on file  Food Insecurity: No Food Insecurity  . Worried About Programme researcher, broadcasting/film/video in the Last Year: Never true  . Ran Out of Food in the Last Year: Never true  Transportation Needs: No Transportation Needs  . Lack of Transportation (Medical): No   . Lack of Transportation (Non-Medical): No  Physical Activity: Inactive  . Days of Exercise per Week: 0 days  . Minutes of Exercise per Session: 0 min  Stress: Stress Concern Present  . Feeling of Stress : Rather much  Social Connections: Somewhat Isolated  . Frequency of Communication with Friends and Family: More than three times a week  . Frequency of Social Gatherings with Friends and Family: Once a week  . Attends Religious Services: More than 4 times per year  . Active Member of Clubs or Organizations: No  . Attends Banker Meetings: Never  . Marital Status: Never married  Intimate Partner Violence: Not At Risk  . Fear of Current or Ex-Partner: No  . Emotionally Abused: No  . Physically Abused: No  . Sexually Abused: No    Past Surgical History:  Procedure Laterality Date  . BREAST SURGERY     lump removed  . ear drum replacement     right  . FRACTURE SURGERY      Family History  Problem Relation Age of Onset  . Bipolar disorder Father   . Drug abuse Father   . Breast cancer Neg Hx   . Colon cancer Neg Hx  Allergies  Allergen Reactions  . Ciprofloxacin Other (See Comments)    numbness    Current Medications:   Current Outpatient Medications:  .  albuterol (VENTOLIN HFA) 108 (90 Base) MCG/ACT inhaler, Inhale 2 puffs into the lungs every 6 (six) hours as needed for wheezing or shortness of breath., Disp: 8 g, Rfl: 0 .  azelastine (ASTELIN) 0.1 % nasal spray, Place 2 sprays into both nostrils 2 (two) times daily. Use in each nostril as directed, Disp: 30 mL, Rfl: 12 .  fluticasone (FLONASE) 50 MCG/ACT nasal spray, Place 2 sprays into both nostrils daily., Disp: 16 g, Rfl: 6 .  ibuprofen (ADVIL,MOTRIN) 200 MG tablet, Take 400 mg by mouth every 6 (six) hours as needed., Disp: , Rfl:  .  lamoTRIgine (LAMICTAL) 150 MG tablet, Take 150 mg by mouth 2 (two) times daily., Disp: , Rfl:  .  prazosin (MINIPRESS) 2 MG capsule, TK 2 CS PO QHS, Disp: , Rfl:   .  QUEtiapine (SEROQUEL) 25 MG tablet, Take 1 tablet (25 mg total) by mouth 2 (two) times daily., Disp: 60 tablet, Rfl: 0 .  sertraline (ZOLOFT) 100 MG tablet, TK 1 T PO QAM, Disp: , Rfl:  .  traZODone (DESYREL) 50 MG tablet, Take 50 mg by mouth at bedtime as needed for sleep., Disp: , Rfl:  .  VENTOLIN HFA 108 (90 Base) MCG/ACT inhaler, INL 2 PFS ITL Q 4 H FOR UP TO 10 DAYS PRF WHZ OR SOB, Disp: , Rfl:  .  VRAYLAR capsule, Take 1.5 mg by mouth every morning., Disp: , Rfl:    Review of Systems:   ROS Negative unless otherwise specified per HPI.  Vitals:   Vitals:   11/16/19 1331  BP: 118/80  Pulse: 89  Temp: 98.2 F (36.8 C)  TempSrc: Temporal  SpO2: 95%  Weight: 277 lb 4 oz (125.8 kg)  Height: 5\' 4"  (1.626 m)     Body mass index is 47.59 kg/m.  Physical Exam:   Physical Exam Vitals and nursing note reviewed. Exam conducted with a chaperone present Butch Penny, LPN).  Constitutional:      General: She is not in acute distress.    Appearance: She is well-developed. She is not ill-appearing or toxic-appearing.  Cardiovascular:     Rate and Rhythm: Normal rate and regular rhythm.     Pulses: Normal pulses.     Heart sounds: Normal heart sounds, S1 normal and S2 normal.     Comments: No LE edema Pulmonary:     Effort: Pulmonary effort is normal.     Breath sounds: Normal breath sounds.  Genitourinary:    Rectum: Guaiac result negative. No tenderness or external hemorrhoid.  Skin:    General: Skin is warm and dry.  Neurological:     Mental Status: She is alert.     GCS: GCS eye subscore is 4. GCS verbal subscore is 5. GCS motor subscore is 6.  Psychiatric:        Speech: Speech normal.        Behavior: Behavior normal. Behavior is cooperative.       Assessment and Plan:   Rufus was seen today for rectal bleeding.  Diagnoses and all orders for this visit:  Rectal bleeding No red flags on exam.  Will check CBC, CMP. GI referral for further evaluation and  management of rectal bleeding. -     CBC with Differential/Platelet -     Comprehensive metabolic panel  Night sweats Unclear etiology. Will send  to GI re: rectal bleeding. Follow-up if symptoms persist.   . Reviewed expectations re: course of current medical issues. . Discussed self-management of symptoms. . Outlined signs and symptoms indicating need for more acute intervention. . Patient verbalized understanding and all questions were answered. . See orders for this visit as documented in the electronic medical record. . Patient received an After-Visit Summary.  CMA or LPN served as scribe during this visit. History, Physical, and Plan performed by medical provider. The above documentation has been reviewed and is accurate and complete.   [name], PA-C

## 2019-12-01 ENCOUNTER — Encounter: Payer: Self-pay | Admitting: Physician Assistant

## 2019-12-01 NOTE — Telephone Encounter (Signed)
Spoke to pt, c/o feeling weak today and having tingling in arms and stomach. Pt said yesterday she had an episode of dizziness and lightheaded felt like she was going to pass out. Drank some orange juice and felt better. Told pt discussed with Lelon Mast and she can come into today if you want and do some lab work or you can monitor symptoms and let us know. Pt declined appt today saying just had labs done will monitor symptoms and call back if she wants to be seen. Told her okay call if you need [place]. Pt verbalized understanding.

## 2019-12-03 ENCOUNTER — Ambulatory Visit: Payer: No Typology Code available for payment source | Admitting: Physician Assistant

## 2019-12-13 ENCOUNTER — Encounter: Payer: Self-pay | Admitting: Family Medicine

## 2019-12-13 ENCOUNTER — Encounter: Payer: Self-pay | Admitting: Physician Assistant

## 2019-12-13 ENCOUNTER — Ambulatory Visit (INDEPENDENT_AMBULATORY_CARE_PROVIDER_SITE_OTHER): Payer: No Typology Code available for payment source | Admitting: Family Medicine

## 2019-12-13 ENCOUNTER — Other Ambulatory Visit: Payer: Self-pay

## 2019-12-13 VITALS — BP 120/78 | HR 102 | Ht 64.0 in | Wt 282.2 lb

## 2019-12-13 DIAGNOSIS — S39012A Strain of muscle, fascia and tendon of lower back, initial encounter: Secondary | ICD-10-CM | POA: Diagnosis not present

## 2019-12-13 MED ORDER — CYCLOBENZAPRINE HCL 10 MG PO TABS: 10.0000 mg | ORAL_TABLET | Freq: Three times a day (TID) | ORAL | 1 refills | Status: DC | PRN

## 2019-12-13 NOTE — Progress Notes (Signed)
Subjective:    CC: Low back pain  I, Molly Weber, LAT, ATC, am serving as scribe for Dr. Lynne Leader.  HPI: Pt is a 34 y/o presenting w/ c/o acute flare of chronic low back pain that began this weekend after doing hair this weekend and working out.  She rates her pain at a 8/10 and describes her pain as sharp.  She denies any injury.  No rating pain weakness or numbness past her buttocks.  No bowel bladder dysfunction.  Radiating pain: Yes into her B hips and buttocks Numbness/tingling in LEs: No LE weakness: No Aggravating factors: All movement; having difficulty sleeping Treatments tried: Ice and Motrin; tried chiropractic care w/ past episodes of low back pain  Diagnostic testing: L-spine MRI - May 2020  Pertinent review of Systems: No fevers or chills.  Relevant historical information: History of bipolar.  History of prior back pain.   Objective:    Vitals:   12/13/19 1342  BP: 120/78  Pulse: (!) 102  SpO2: 98%   General: Well Developed, well nourished, and in no acute distress.   MSK:  L-spine: Nontender to spinal midline.  Tender palpation lumbar paraspinal musculature. Significantly reduced lumbar motion. Lower extremity strength reflexes and sensation are equal normal throughout. Significant antalgic gait.  Lab and Radiology Results  MRI Spine Lumbar WO IV Contrast5/21/2020 [facility] Result Impression  IMPRESSION:   L5-S1: Disc desiccation and mild disc height loss. Small central disc extrusion (age-indeterminate), mildly eccentric to the right. Very mild bilateral facet arthrosis. Slight retrolisthesis of L5 on S1. No significant central zone stenosis. The small  disc extrusion minimally indents the ventral thecal sac. It abuts the bilateral descending S1 nerve roots, without nerve root compression. Mild bilateral subarticular zone stenosis. Mild left neural foraminal stenosis. No significant right neural  foraminal stenosis.  The remaining  lumbar levels are normal.  Electronically Signed by: Mali Holder  Result Narrative  MRI LUMBAR SPINE WITHOUT CONTRAST  INDICATION: Low back pain.  TECHNIQUE: Multiplanar, multisequence MR imaging of the lumbar spine without IV contrast.  COMPARISON: Lumbar spine radiographs 03/03/2019.  FINDINGS: For the purposes of this dictation, it is assumed that the most caudal lumbar-type vertebra is L5, and that the most caudal full intervertebral disc is L5-S1.  Vertebral alignment: Slight retrolisthesis of L5 on S1.  Vertebral body heights: Normal. Marrow signal: Normal. Conus medullaris: Terminates at a normal level, and is normal in size, contour, and signal intensity. Individual disc levels:   T12-L1 (sagittal images only): Normal. L1-2 (sagittal images only): Normal. L2-3: Normal. L3-4: Normal. L4-5: Normal.  L5-S1: Disc desiccation and mild disc height loss. Small central disc extrusion (age-indeterminate), mildly eccentric to the right. Very mild bilateral facet arthrosis. Slight retrolisthesis of L5 on S1. No significant central zone stenosis. The small  disc extrusion minimally indents the ventral thecal sac. It abuts the bilateral descending S1 nerve roots, without nerve root compression. Mild bilateral subarticular zone stenosis. Mild left neural foraminal stenosis. No significant right neural  foraminal stenosis.      Impression and Recommendations:    Assessment and Plan: 34 y.o. female with lumbosacral strain.  Patient has symptoms consistent with myofascial spasm and dysfunction.  Fortunately she does not have significant radicular symptoms or physical exam findings.  MRI reviewed as above. Plan for trial of physical therapy.  Limited cyclobenzaprine along with over-the-counter medications for pain control.  Recommend heating pad and TENS unit also.  Recheck back in about a month.Marland Kitchen  PDMP reviewed during this encounter. Orders Placed This Encounter  Procedures  .  Ambulatory referral to Physical Therapy    Referral Priority:   Routine    Referral Type:   Physical Medicine    Referral Reason:   Specialty Services Required    Requested Specialty:   Physical Therapy   Meds ordered this encounter  Medications  . cyclobenzaprine (FLEXERIL) 10 MG tablet    Sig: Take 1 tablet (10 mg total) by mouth 3 (three) times daily as needed for muscle spasms.    Dispense:  60 tablet    Refill:  1    Discussed warning signs or symptoms. Please see discharge instructions. Patient expresses understanding.   The above documentation has been reviewed and is accurate and complete Clementeen Graham

## 2019-12-13 NOTE — Patient Instructions (Addendum)
Thank you for coming in today. Continue ibuprofen for pain.  Add flexeril muscle relaxer up to 3x daily for pain as needed.  Attend PT.  Use heating pad and TENS unit.   TENS UNIT: This is helpful for muscle pain and spasm.   Search and Purchase a TENS 7000 2nd edition at  www.tenspros.com or www.[employer]aron.com It should be less than $30.     TENS unit instructions: Do not shower or bathe with the unit on Turn the unit off before removing electrodes or batteries If the electrodes lose stickiness add a drop of water to the electrodes after they are disconnected from the unit and place on plastic sheet. If you continued to have difficulty, call the TENS unit company to purchase more electrodes. Do not apply lotion on the skin area prior to use. Make sure the skin is clean and dry as this will help prolong the life of the electrodes. After use, always check skin for unusual red areas, rash or other skin difficulties. If there are any skin problems, does not apply electrodes to the same area. Never remove the electrodes from the unit by pulling the wires. Do not use the TENS unit or electrodes other than as directed. Do not change electrode placement without consultating your therapist or physician. Keep 2 fingers with between each electrode. Wear time ratio is 2:1, on to off times.    For example on for 30 minutes off for 15 minutes and then on for 30 minutes off for 15 minutes     Lumbosacral Strain Lumbosacral strain is an injury that causes pain in the lower back (lumbosacral spine). This injury usually happens from overstretching the muscles or ligaments along your spine. Ligaments are cord-like tissues that connect bones to other bones. A strain can affect one or more muscles or ligaments. What are the causes? This condition may be caused by:  A hard, direct hit to the back.  Overstretching the lower back muscles. This may result from: ? A fall. ? Lifting something  heavy. ? Repetitive movements such as bending or crouching. What increases the risk? The following factors may make you more likely to develop this condition:  Participating in sports or activities that involve: ? A sudden twist of the back. ? Pushing or pulling motions.  Being overweight or obese.  Having poor strength and flexibility, especially tight hamstrings or weak muscles in the back or abdomen.  Having too much of a curve in the lower back.  Having a pelvis that is tilted forward. What are the signs or symptoms? The main symptom of this condition is pain in the lower back, at the site of the strain. Pain may also be felt down one or both legs. How is this diagnosed? This condition is diagnosed based on your symptoms, your medical history, and a physical exam. During the physical exam, your health care provider may push on certain areas of your back to find the source of your pain. You may be asked to bend forward, backward, and side to side to check your pain and range of motion. You may also have imaging tests, such as X-rays and an MRI. How is this treated? This condition may be treated by:  Applying heat and cold on the affected area.  Taking medicines to help relieve pain and relax your muscles.  Taking NSAIDs, such as ibuprofen, to help reduce swelling and discomfort.  Doing stretching and strengthening exercises for your lower back. Symptoms usually improve within several  weeks of treatment. However, recovery time varies. When your symptoms improve, gradually return to your normal routine as soon as possible to reduce pain, avoid stiffness, and keep muscle strength. Follow these instructions at home: Medicines  Take over-the-counter and prescription medicines only as told by your health care provider.  Ask your health care provider if the medicine prescribed to you: ? Requires you to avoid driving or using heavy machinery. ? Can cause constipation. You may need  to take these actions to prevent or treat constipation:  Drink enough fluid to keep your urine pale yellow.  Take over-the-counter or prescription medicines.  Eat foods that are high in fiber, such as beans, whole grains, and fresh fruits and vegetables.  Limit foods that are high in fat and processed sugars, such as fried or sweet foods. Managing pain, stiffness, and swelling      If directed, put ice on the injured area. To do this: ? Put ice in a plastic bag. ? Place a towel between your skin and the bag. ? Leave the ice on for 20 minutes, 2-3 times a day.  If directed, apply heat on the affected area as often as told by your health care provider. Use the heat source that your health care provider recommends, such as a moist heat pack or a heating pad. ? Place a towel between your skin and the heat source. ? Leave the heat on for 20-30 minutes. ? Remove the heat if your skin turns bright red. This is especially important if you are unable to feel pain, heat, or cold. You may have a greater risk of getting burned. Activity  Rest as told by your health care provider.  Do not stay in bed. Staying in bed for more than 1-2 days can delay your recovery.  Return to your normal activities as told by your health care provider. Ask your health care provider what activities are safe for you.  Avoid activities that take a lot of energy for as long as told by your health care provider.  Do exercises as told by your health care provider. This includes stretching and strengthening exercises. General instructions  Sit up and stand up straight. Avoid leaning forward when you sit, or hunching over when you stand.  Do not use any products that contain nicotine or tobacco, such as cigarettes, e-cigarettes, and chewing tobacco. If you need help quitting, ask your health care provider.  Keep all follow-up visits as told by your health care provider. This is important. How is this  prevented?   Use correct form when playing sports and lifting heavy objects.  Use good posture when sitting and standing.  Maintain a healthy weight.  Sleep on a mattress with medium firmness to support your back.  Do at least 150 minutes of moderate-intensity exercise each week, such as brisk walking or water aerobics. Try a form of exercise that takes stress off your back, such as swimming or stationary cycling.  Maintain physical fitness, including: ? Strength. ? Flexibility. Contact a health care provider if:  Your back pain does not improve after several weeks of treatment.  Your symptoms get worse. Get help right away if:  Your back pain is severe.  You cannot stand or walk.  You have difficulty controlling when you urinate or when you have a bowel movement.  You feel nauseous or you vomit.  Your feet or legs get very cold, turn pale, or look blue.  You have numbness, tingling, weakness, or problems  using your arms or legs.  You develop any of the following: ? Shortness of breath. ? Dizziness. ? Pain in your legs. ? Weakness in your buttocks or legs. Summary  Lumbosacral strain is an injury that causes pain in the lower back (lumbosacral spine).  This injury usually happens from overstretching the muscles or ligaments along your spine.  This condition may be caused by a direct hit to the lower back or by overstretching the lower back muscles.  Symptoms usually improve within several weeks of treatment. This information is not intended to replace advice given to you by your health care provider. Make sure you discuss any questions you have with your health care provider. Document Revised: 03/02/2019 Document Reviewed: 03/02/2019 Elsevier Patient Education  [place].

## 2019-12-13 NOTE — Telephone Encounter (Signed)
Patient has been scheduled for Dr. Denyse Amass

## 2019-12-13 NOTE — Telephone Encounter (Signed)
Please call pt and schedule visit for back pain.

## 2019-12-14 ENCOUNTER — Ambulatory Visit (INDEPENDENT_AMBULATORY_CARE_PROVIDER_SITE_OTHER): Payer: No Typology Code available for payment source | Admitting: Physical Therapy

## 2019-12-14 ENCOUNTER — Encounter: Payer: Self-pay | Admitting: Physical Therapy

## 2019-12-14 ENCOUNTER — Other Ambulatory Visit: Payer: Self-pay

## 2019-12-14 DIAGNOSIS — M545 Low back pain, unspecified: Secondary | ICD-10-CM

## 2019-12-14 NOTE — Patient Instructions (Signed)
Access Code: [email or web address]  URL: [email or web address]  Date: 12/14/2019  Prepared by: Sedalia Muta   Exercises Supine Single Knee to Chest - 3 reps - 30 hold - 3x daily Supine Posterior Pelvic Tilt - 10 reps - 2 sets - 2x daily

## 2019-12-15 ENCOUNTER — Encounter: Payer: Self-pay | Admitting: Physical Therapy

## 2019-12-15 ENCOUNTER — Telehealth: Payer: Self-pay

## 2019-12-15 ENCOUNTER — Encounter: Payer: Self-pay | Admitting: Family Medicine

## 2019-12-15 MED ORDER — PREDNISONE 50 MG PO TABS: 50.0000 mg | ORAL_TABLET | Freq: Every day | ORAL | 0 refills | Status: DC

## 2019-12-15 MED ORDER — BACLOFEN 10 MG PO TABS: 10.0000 mg | ORAL_TABLET | Freq: Three times a day (TID) | ORAL | 1 refills | Status: DC | PRN

## 2019-12-15 NOTE — Telephone Encounter (Signed)
Pt called back to say no medication is working, ha PT yesterday didn't help much. States just was stuck on the floor and had to call a friend to help her up the pain is much worse radiating halfway down her legs now no weakness.

## 2019-12-15 NOTE — Therapy (Signed)
[facility] [address] [place], [place], [address] Phone: [phone]   Fax:  [phone]  Physical Therapy Evaluation  Patient Details  Name: Sarah Cardenas MRN: [id number] Date of Birth: [birth year] Referring Provider (PT): Clementeen Graham   Encounter Date: 12/14/2019  PT End of Session - 12/15/19 1332    Visit Number  1    Number of Visits  12    Date for PT Re-Evaluation  01/25/20    Authorization Type  UHC    PT Start Time  1433    PT Stop Time  1513    PT Time Calculation (min)  40 min    Activity Tolerance  Patient tolerated treatment well    Behavior During Therapy  [facility] for tasks assessed/performed       Past Medical History:  Diagnosis Date  . Anxiety   . Bipolar affect, depressed (HCC)   . Depression     Past Surgical History:  Procedure Laterality Date  . BREAST SURGERY     lump removed  . ear drum replacement     right  . FRACTURE SURGERY      There were no vitals filed for this visit.   Subjective Assessment - 12/14/19 1441    Subjective  Pt states no injury, but increased standing time since last week, doing hair. 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 Pt states she has has this same pain happen about 4 times in the last year. Does resolve, but has been to chiropractor to help as well.  Pain in center of back, low lumbar.  Bil Thigh pain, no numbness, tingling. Unable to go to work last 2 days, pain so severe.    Patient Stated Goals  decreased pain    Currently in Pain?  Yes    Pain Score  8     Pain Location  Back    Pain Orientation  Right;Left;Lower    Pain Descriptors / Indicators  Aching;Burning    Pain Type  Acute pain    Pain Radiating Towards  Bil thighs /ant/lateral    Pain Onset  More than a month ago    Pain Frequency  Intermittent    Aggravating Factors   all movements.    Pain Relieving Factors  laying flat, legs in ER         [facility] PT Assessment - 12/15/19 0001       Assessment   Medical Diagnosis  Low Back pain    Referring Provider (PT)  Clementeen Graham    Prior Therapy  no      Balance Screen   Has the patient fallen in the past 6 months  No      Prior Function   Level of Independence  Independent      Cognition   Overall Cognitive Status  Within Functional Limits for tasks assessed      Posture/Postural Control   Posture Comments  increased lordosis in standing and sittig       ROM / Strength   AROM / PROM / Strength  AROM;Strength      AROM   Overall AROM Comments  Hip ROM: WNL, KNee: WNL    AROM Assessment Site  Lumbar    Lumbar Flexion  significant limitation/pain    Lumbar Extension  significant limitation/pain    Lumbar - Right Side Bend  mod limitation    Lumbar - Left Side Bend  mod limitation  Strength   Overall Strength Comments  Hips: 4-/5, Knee: 5/5, Core: 3/5       Palpation   Palpation comment  Pain in central low lumbar spine, with light palpation, minimal pain or spasm in paraspinal musculature,  Pain in Bil SI, no pain in glutes.       Special Tests   Other special tests  Inc pain in back/SI with SLR on R;                  Objective measurements completed on examination: See above findings.      [facility] Adult PT Treatment/Exercise - 12/15/19 0001      Exercises   Exercises  Lumbar      Lumbar Exercises: Stretches   Single Knee to Chest Stretch  2 reps;30 seconds    Pelvic Tilt  15 reps             PT Education - 12/15/19 1330    Education Details  PT POC, initial HEP, Exam findings    Person(s) Educated  Patient    Methods  Explanation;Demonstration;Verbal cues;Handout    Comprehension  Verbalized understanding;Returned demonstration;Verbal cues required;Tactile cues required;Need further instruction       PT Short Term Goals - 12/15/19 1335      PT SHORT TERM GOAL #1   Title  Pt to be independent with initial HEP    Time  2    Period  Weeks    Status  New    Target Date   12/28/19      PT SHORT TERM GOAL #2   Title  Pt to report decreased pain in back to 0-4/10    Time  2    Period  Weeks    Status  New    Target Date  12/28/19        PT Long Term Goals - 12/15/19 1336      PT LONG TERM GOAL #1   Title  Pt to be independent with final HEP    Time  6    Period  Weeks    Status  New    Target Date  01/25/20      PT LONG TERM GOAL #2   Title  Pt to report decreased pain in low back to 0-2/10 with activity    Time  6    Period  Weeks    Status  New    Target Date  01/25/20      PT LONG TERM GOAL #3   Title  Pt to demo ability for correct mechanics with bend, squat, lift for safety with low back and functional activity    Time  6    Period  Weeks    Status  New    Target Date  01/25/20      PT LONG TERM GOAL #4   Title  Pt to demo improved strength of bil Hips and core to at least 4+/5 to improve stability and pain    Time  6    Period  Weeks    Status  New    Target Date  01/25/20      PT LONG TERM GOAL #5   Title  Pt to demo ability for full lumbar ROM wtihout pain, to improve ability for ADLS. and IADLs.    Time  6    Period  Weeks    Status  New    Target Date  01/25/20  Plan - 12/15/19 1346    Clinical Impression Statement  Pt presents with primary complaint of increased pain in low back. She has signficant soreness in central, low lumbar region and SI today, with much diffiuculty with all transfers, and movements. Pt with pain into bil thighs, but no numbness/tingling below knee. She has had a few episodes of this same pain and has lack of effective HEP for this. She has very limited lumbar ROM today. Pt with significant decrease in ability for functional activity due to pain. Pt to benefit from skilled PT to improve. Will try to get pt in another day this week, due to significant pain.    Personal Factors and Comorbidities  Past/Current Experience    Examination-Activity Limitations  Locomotion  Level;Bathing;Transfers;Bed Mobility;Bend;Sit;Squat;Stairs;Stand;Lift    Examination-Participation Restrictions  Meal Prep;Cleaning;Community Activity;Driving;Shop;Laundry;Yard Work    Stability/Clinical Decision Making  Stable/Uncomplicated    Clinical Decision Making  Low    Rehab Potential  Good    PT Frequency  2x / week    PT Duration  6 weeks    PT Treatment/Interventions  ADLs/Self Care Home Management;Cryotherapy;Electrical Stimulation;Gait training;DME Instruction;Ultrasound;Traction;Moist Heat;Iontophoresis 4mg /ml Dexamethasone;Stair training;Functional mobility training;Therapeutic activities;Therapeutic exercise;Balance training;Neuromuscular re-education;Manual techniques;Patient/family education;Passive range of motion;Orthotic Fit/Training;Dry needling;Taping;Joint Manipulations;Spinal Manipulations    Consulted and Agree with Plan of Care  Patient       Patient will benefit from skilled therapeutic intervention in order to improve the following deficits and impairments:  Abnormal gait, Decreased range of motion, Difficulty walking, Obesity, Increased muscle spasms, Decreased safety awareness, Decreased activity tolerance, Pain, Improper body mechanics, Impaired flexibility, Decreased mobility, Decreased strength  Visit Diagnosis: Acute bilateral low back pain, unspecified whether sciatica present     Problem List Patient Active Problem List   Diagnosis Date Noted  . Bipolar 1 disorder, depressed, severe ([place]) 05/26/2019  . Cervical intraepithelial neoplasia grade 1 12/07/2018    Lyndee Hensen, PT, DPT 3:34 PM  12/15/19    [place], [place], [address] Phone: [phone]   Fax:  [phone]  Name: Sarah Cardenas MRN: [id number] Date of Birth: Apr 21, 1986

## 2019-12-15 NOTE — Addendum Note (Signed)
Addended by: Sedalia Muta on: 12/15/2019 03:36 PM   Modules accepted: Orders

## 2019-12-15 NOTE — Telephone Encounter (Signed)
I called Sarah Cardenas back.  She still hurting quite a bit.  She has another physical therapy appointment set up for tomorrow.  Recommend that she go to that and maybe try dry needling or other modalities. We will switch up muscle relaxer and cancel cyclobenzaprine and try baclofen.  Additionally will send a course of prednisone and.  She notes the pain does not radiate past her knee. Asked her to keep me updated and will go from there.

## 2019-12-16 ENCOUNTER — Other Ambulatory Visit: Payer: Self-pay

## 2019-12-16 ENCOUNTER — Ambulatory Visit (INDEPENDENT_AMBULATORY_CARE_PROVIDER_SITE_OTHER): Payer: No Typology Code available for payment source | Admitting: Physical Therapy

## 2019-12-16 DIAGNOSIS — M545 Low back pain, unspecified: Secondary | ICD-10-CM

## 2019-12-19 ENCOUNTER — Encounter: Payer: Self-pay | Admitting: Physical Therapy

## 2019-12-19 NOTE — Therapy (Signed)
[facility] [address] [place], [place], [address] Phone: [phone]   Fax:  [phone]  Physical Therapy Treatment  Patient Details  Name: Marybell Robards MRN: [id number] Date of Birth: 03-Sep-1986 Referring Provider (PT): Clementeen Graham   Encounter Date: 12/16/2019  PT End of Session - 12/19/19 1949    Visit Number  2    Number of Visits  12    Date for PT Re-Evaluation  01/25/20    Authorization Type  UHC    PT Start Time  1016    PT Stop Time  1105    PT Time Calculation (min)  49 min    Activity Tolerance  Patient tolerated treatment well    Behavior During Therapy  [facility] for tasks assessed/performed       Past Medical History:  Diagnosis Date  . Anxiety   . Bipolar affect, depressed (HCC)   . Depression     Past Surgical History:  Procedure Laterality Date  . BREAST SURGERY     lump removed  . ear drum replacement     right  . FRACTURE SURGERY      There were no vitals filed for this visit.  Subjective Assessment - 12/19/19 1946    Subjective  Pt states some decrease in pain since last visit. She will not return to work until next week.    Currently in Pain?  Yes    Pain Score  7     Pain Location  Back    Pain Orientation  Right;Left    Pain Descriptors / Indicators  Aching    Pain Type  Acute pain    Pain Onset  More than a month ago    Pain Frequency  Intermittent                       [facility] Adult PT Treatment/Exercise - 12/19/19 0001      Posture/Postural Control   Posture Comments  increased lordosis in standing and sittig       Exercises   Exercises  Lumbar      Lumbar Exercises: Stretches   Active Hamstring Stretch  30 seconds;3 reps;Right;Left    Single Knee to Chest Stretch  2 reps;30 seconds    Pelvic Tilt  20 reps      Lumbar Exercises: Aerobic   Stationary Bike  L1 x 5 min;       Lumbar Exercises: Standing   Other Standing Lumbar Exercises  March x20;       Lumbar  Exercises: Supine   Ab Set  10 reps    Bent Knee Raise  20 reps      Modalities   Modalities  Electrical Stimulation;Moist Heat      Moist Heat Therapy   Number Minutes Moist Heat  10 Minutes    Moist Heat Location  Lumbar Spine      Electrical Stimulation   Electrical Stimulation Location  R lumbar    Electrical Stimulation Action  PreMod    Electrical Stimulation Parameters  x10 min    Electrical Stimulation Goals  Pain      Manual Therapy   Manual Therapy  Soft tissue mobilization;Joint mobilization;Manual Traction    Joint Mobilization  Light PA mobs gt 2-3 lumbar     Soft tissue mobilization  STM/DTM to bil lumbar and SI region    Manual Traction  Long leg distraction x2 min bil  PT Short Term Goals - 12/15/19 1335      PT SHORT TERM GOAL #1   Title  Pt to be independent with initial HEP    Time  2    Period  Weeks    Status  New    Target Date  12/28/19      PT SHORT TERM GOAL #2   Title  Pt to report decreased pain in back to 0-4/10    Time  2    Period  Weeks    Status  New    Target Date  12/28/19        PT Long Term Goals - 12/15/19 1336      PT LONG TERM GOAL #1   Title  Pt to be independent with final HEP    Time  6    Period  Weeks    Status  New    Target Date  01/25/20      PT LONG TERM GOAL #2   Title  Pt to report decreased pain in low back to 0-2/10 with activity    Time  6    Period  Weeks    Status  New    Target Date  01/25/20      PT LONG TERM GOAL #3   Title  Pt to demo ability for correct mechanics with bend, squat, lift for safety with low back and functional activity    Time  6    Period  Weeks    Status  New    Target Date  01/25/20      PT LONG TERM GOAL #4   Title  Pt to demo improved strength of bil Hips and core to at least 4+/5 to improve stability and pain    Time  6    Period  Weeks    Status  New    Target Date  01/25/20      PT LONG TERM GOAL #5   Title  Pt to demo ability for full  lumbar ROM wtihout pain, to improve ability for ADLS. and IADLs.    Time  6    Period  Weeks    Status  New    Target Date  01/25/20            Plan - 12/19/19 1953    Clinical Impression Statement  Pt with mild decrease in pain since last visit, with decreased pain into hips, and most pain concentrated at R low lumbar /SI region . Improved ability for amublaiton and transfers, able to progress ther ex without increased pain. Manual and E-stim done for pain. plan to progress as tolerated.    Personal Factors and Comorbidities  Past/Current Experience    Examination-Activity Limitations  Locomotion Level;Bathing;Transfers;Bed Mobility;Bend;Sit;Squat;Stairs;Stand;Lift    Examination-Participation Restrictions  Meal Prep;Cleaning;Community Activity;Driving;Shop;Laundry;Yard Work    Stability/Clinical Decision Making  Stable/Uncomplicated    Rehab Potential  Good    PT Frequency  2x / week    PT Duration  6 weeks    PT Treatment/Interventions  ADLs/Self Care Home Management;Cryotherapy;Electrical Stimulation;Gait training;DME Instruction;Ultrasound;Traction;Moist Heat;Iontophoresis 4mg /ml Dexamethasone;Stair training;Functional mobility training;Therapeutic activities;Therapeutic exercise;Balance training;Neuromuscular re-education;Manual techniques;Patient/family education;Passive range of motion;Orthotic Fit/Training;Dry needling;Taping;Joint Manipulations;Spinal Manipulations    Consulted and Agree with Plan of Care  Patient       Patient will benefit from skilled therapeutic intervention in order to improve the following deficits and impairments:  Abnormal gait, Decreased range of motion, Difficulty walking, Obesity, Increased muscle spasms, Decreased safety awareness,  Decreased activity tolerance, Pain, Improper body mechanics, Impaired flexibility, Decreased mobility, Decreased strength  Visit Diagnosis: Acute bilateral low back pain, unspecified whether sciatica  present     Problem List Patient Active Problem List   Diagnosis Date Noted  . Bipolar 1 disorder, depressed, severe ([place]) 05/26/2019  . Cervical intraepithelial neoplasia grade 1 12/07/2018    Lyndee Hensen, PT, DPT 7:56 PM  12/19/19    [place], [place], [address] Phone: [phone]   Fax:  [phone]  Name: Acire Tang MRN: [id number] Date of Birth: 06-Aug-1986

## 2019-12-21 ENCOUNTER — Encounter: Payer: Self-pay | Admitting: Physical Therapy

## 2019-12-21 ENCOUNTER — Ambulatory Visit (INDEPENDENT_AMBULATORY_CARE_PROVIDER_SITE_OTHER): Payer: No Typology Code available for payment source | Admitting: Physical Therapy

## 2019-12-21 ENCOUNTER — Encounter: Payer: No Typology Code available for payment source | Admitting: Physical Therapy

## 2019-12-21 ENCOUNTER — Other Ambulatory Visit: Payer: Self-pay

## 2019-12-21 DIAGNOSIS — M545 Low back pain, unspecified: Secondary | ICD-10-CM

## 2019-12-21 NOTE — Therapy (Signed)
[facility] [address] [place], [place], [address] Phone: [phone]   Fax:  [phone]  Physical Therapy Treatment  Patient Details  Name: Sarah Cardenas MRN: [id number] Date of Birth: 07/21/1986 Referring Provider (PT): Clementeen Graham   Encounter Date: 12/21/2019  PT End of Session - 12/21/19 1000    Visit Number  3    Number of Visits  12    Date for PT Re-Evaluation  01/25/20    Authorization Type  UHC    PT Start Time  0845    PT Stop Time  0942    PT Time Calculation (min)  57 min    Activity Tolerance  Patient tolerated treatment well    Behavior During Therapy  [facility] for tasks assessed/performed       Past Medical History:  Diagnosis Date  . Anxiety   . Bipolar affect, depressed (HCC)   . Depression     Past Surgical History:  Procedure Laterality Date  . BREAST SURGERY     lump removed  . ear drum replacement     right  . FRACTURE SURGERY      There were no vitals filed for this visit.  Subjective Assessment - 12/21/19 0959    Subjective  Pt states mild decrease in pain, moving a bit better .Went to work for 1/2 day yesterday    Currently in Pain?  Yes    Pain Score  5     Pain Location  Back    Pain Orientation  Right    Pain Descriptors / Indicators  Aching    Pain Type  Acute pain    Pain Onset  1 to 4 weeks ago    Pain Frequency  Intermittent                       [facility] Adult PT Treatment/Exercise - 12/21/19 0851      Posture/Postural Control   Posture Comments  increased lordosis in standing and sittig       Exercises   Exercises  Lumbar      Lumbar Exercises: Stretches   Active Hamstring Stretch  30 seconds;3 reps;Right;Left    Single Knee to Chest Stretch  2 reps;30 seconds    Pelvic Tilt  20 reps    Figure 4 Stretch  3 reps;30 seconds      Lumbar Exercises: Aerobic   Stationary Bike  L1 x 8 min;       Lumbar Exercises: Standing   Other Standing Lumbar Exercises  --       Lumbar Exercises: Supine   Ab Set  10 reps    Clam  20 reps    Clam Limitations  GTB wtih TA    Bent Knee Raise  20 reps    Bent Knee Raise Limitations  with TA    Bridge  10 reps      Lumbar Exercises: Sidelying   Hip Abduction  15 reps;Right      Modalities   Modalities  Electrical Stimulation;Moist Heat      Moist Heat Therapy   Number Minutes Moist Heat  12 Minutes    Moist Heat Location  Lumbar Spine      Electrical Stimulation   Electrical Stimulation Location  R lumbar    Electrical Stimulation Action  pre mod    Electrical Stimulation Parameters  12    Electrical Stimulation Goals  Pain      Manual Therapy  Manual Therapy  Soft tissue mobilization;Joint mobilization;Manual Traction;Muscle Energy Technique    Joint Mobilization  Light PA mobs gt 2-3 lumbar     Soft tissue mobilization  STM/DTM to R lumbar and SI region    Manual Traction  Long leg distraction x2 min on R     Muscle Energy Technique  For R ant rotation                PT Short Term Goals - 12/15/19 1335      PT SHORT TERM GOAL #1   Title  Pt to be independent with initial HEP    Time  2    Period  Weeks    Status  New    Target Date  12/28/19      PT SHORT TERM GOAL #2   Title  Pt to report decreased pain in back to 0-4/10    Time  2    Period  Weeks    Status  New    Target Date  12/28/19        PT Long Term Goals - 12/15/19 1336      PT LONG TERM GOAL #1   Title  Pt to be independent with final HEP    Time  6    Period  Weeks    Status  New    Target Date  01/25/20      PT LONG TERM GOAL #2   Title  Pt to report decreased pain in low back to 0-2/10 with activity    Time  6    Period  Weeks    Status  New    Target Date  01/25/20      PT LONG TERM GOAL #3   Title  Pt to demo ability for correct mechanics with bend, squat, lift for safety with low back and functional activity    Time  6    Period  Weeks    Status  New    Target Date  01/25/20      PT LONG  TERM GOAL #4   Title  Pt to demo improved strength of bil Hips and core to at least 4+/5 to improve stability and pain    Time  6    Period  Weeks    Status  New    Target Date  01/25/20      PT LONG TERM GOAL #5   Title  Pt to demo ability for full lumbar ROM wtihout pain, to improve ability for ADLS. and IADLs.    Time  6    Period  Weeks    Status  New    Target Date  01/25/20            Plan - 12/21/19 1002    Clinical Impression Statement  Pt with concentrated pain at R low lumbar, SI region. Improving pain from previous sessions, with improved ability for ther ex and transfers. Manual and modalities used for pain today . Plan to progress as tolerated.    Personal Factors and Comorbidities  Past/Current Experience    Examination-Activity Limitations  Locomotion Level;Bathing;Transfers;Bed Mobility;Bend;Sit;Squat;Stairs;Stand;Lift    Examination-Participation Restrictions  Meal Prep;Cleaning;Community Activity;Driving;Shop;Laundry;Yard Work    Stability/Clinical Decision Making  Stable/Uncomplicated    Rehab Potential  Good    PT Frequency  2x / week    PT Duration  6 weeks    PT Treatment/Interventions  ADLs/Self Care Home Management;Cryotherapy;Electrical Stimulation;Gait training;DME Instruction;Ultrasound;Traction;Moist Heat;Iontophoresis 4mg /ml Dexamethasone;Stair training;Functional mobility training;Therapeutic  activities;Therapeutic exercise;Balance training;Neuromuscular re-education;Manual techniques;Patient/family education;Passive range of motion;Orthotic Fit/Training;Dry needling;Taping;Joint Manipulations;Spinal Manipulations    Consulted and Agree with Plan of Care  Patient       Patient will benefit from skilled therapeutic intervention in order to improve the following deficits and impairments:  Abnormal gait, Decreased range of motion, Difficulty walking, Obesity, Increased muscle spasms, Decreased safety awareness, Decreased activity tolerance, Pain, Improper  body mechanics, Impaired flexibility, Decreased mobility, Decreased strength  Visit Diagnosis: Acute bilateral low back pain, unspecified whether sciatica present     Problem List Patient Active Problem List   Diagnosis Date Noted  . Bipolar 1 disorder, depressed, severe (HCC) 05/26/2019  . Cervical intraepithelial neoplasia grade 1 12/07/2018   Sedalia Muta, PT, DPT 10:03 AM  12/21/19    [facility] [address] [place], [place], [address] Phone: [phone]   Fax:  [phone]  Name: Sarah Cardenas MRN: [id number] Date of Birth: 1985-12-25

## 2019-12-22 ENCOUNTER — Encounter: Payer: Self-pay | Admitting: Family Medicine

## 2019-12-23 ENCOUNTER — Ambulatory Visit (INDEPENDENT_AMBULATORY_CARE_PROVIDER_SITE_OTHER): Payer: No Typology Code available for payment source | Admitting: Physical Therapy

## 2019-12-23 ENCOUNTER — Encounter: Payer: Self-pay | Admitting: Physical Therapy

## 2019-12-23 ENCOUNTER — Other Ambulatory Visit: Payer: Self-pay

## 2019-12-23 DIAGNOSIS — M545 Low back pain, unspecified: Secondary | ICD-10-CM

## 2019-12-23 NOTE — Therapy (Signed)
[facility] [address] [place], [place], [address] Phone: [phone]   Fax:  [phone]  Physical Therapy Treatment  Patient Details  Name: Sarah Cardenas MRN: [id number] Date of Birth: July 27, 1986 Referring Provider (PT): Clementeen Graham   Encounter Date: 12/23/2019  PT End of Session - 12/23/19 1605    Visit Number  4    Number of Visits  12    Date for PT Re-Evaluation  01/25/20    Authorization Type  UHC    PT Start Time  1600    PT Stop Time  1648    PT Time Calculation (min)  48 min    Activity Tolerance  Patient tolerated treatment well    Behavior During Therapy  [facility] for tasks assessed/performed       Past Medical History:  Diagnosis Date  . Anxiety   . Bipolar affect, depressed (HCC)   . Depression     Past Surgical History:  Procedure Laterality Date  . BREAST SURGERY     lump removed  . ear drum replacement     right  . FRACTURE SURGERY      There were no vitals filed for this visit.  Subjective Assessment - 12/23/19 1605    Subjective  Pt not able to work today, states mild increase in pain in back.    Currently in Pain?  Yes    Pain Score  5     Pain Location  Back    Pain Orientation  Right    Pain Descriptors / Indicators  Aching    Pain Type  Acute pain    Pain Onset  1 to 4 weeks ago    Pain Frequency  Intermittent                       [facility] Adult PT Treatment/Exercise - 12/23/19 1606      Posture/Postural Control   Posture Comments  increased lordosis in standing and sittig       Exercises   Exercises  Lumbar      Lumbar Exercises: Stretches   Active Hamstring Stretch  30 seconds;3 reps;Right;Left    Single Knee to Chest Stretch  1 rep;30 seconds;Right;Left    Lower Trunk Rotation  --    Lower Trunk Rotation Limitations  painful     Pelvic Tilt  20 reps    Figure 4 Stretch  3 reps;30 seconds    Figure 4 Stretch Limitations  seated on R       Lumbar Exercises:  Aerobic   Stationary Bike  L1 x 7 min;       Lumbar Exercises: Standing   Other Standing Lumbar Exercises  March x20, Hip ext x10 bil;  Hip abd 2x10 bil;       Lumbar Exercises: Supine   Ab Set  10 reps    Clam  20 reps    Clam Limitations  GTB wtih TA    Bent Knee Raise  20 reps    Bent Knee Raise Limitations  with TA    Bridge  --      Lumbar Exercises: Sidelying   Hip Abduction  15 reps;Right      Modalities   Modalities  Electrical Stimulation;Moist Heat      Moist Heat Therapy   Number Minutes Moist Heat  12 Minutes    Moist Heat Location  Lumbar Spine      Electrical Stimulation   Electrical Stimulation  Location  R lumbar    Electrical Stimulation Action  pre mod    Electrical Stimulation Parameters  12    Electrical Stimulation Goals  Pain      Manual Therapy   Manual Therapy  Soft tissue mobilization;Joint mobilization;Manual Traction;Muscle Energy Technique    Joint Mobilization  PA mobs lumbar gr 3     Soft tissue mobilization  STM/DTM to R lumbar and SI region    Manual Traction  --    Muscle Energy Technique  --               PT Short Term Goals - 12/15/19 1335      PT SHORT TERM GOAL #1   Title  Pt to be independent with initial HEP    Time  2    Period  Weeks    Status  New    Target Date  12/28/19      PT SHORT TERM GOAL #2   Title  Pt to report decreased pain in back to 0-4/10    Time  2    Period  Weeks    Status  New    Target Date  12/28/19        PT Long Term Goals - 12/15/19 1336      PT LONG TERM GOAL #1   Title  Pt to be independent with final HEP    Time  6    Period  Weeks    Status  New    Target Date  01/25/20      PT LONG TERM GOAL #2   Title  Pt to report decreased pain in low back to 0-2/10 with activity    Time  6    Period  Weeks    Status  New    Target Date  01/25/20      PT LONG TERM GOAL #3   Title  Pt to demo ability for correct mechanics with bend, squat, lift for safety with low back and  functional activity    Time  6    Period  Weeks    Status  New    Target Date  01/25/20      PT LONG TERM GOAL #4   Title  Pt to demo improved strength of bil Hips and core to at least 4+/5 to improve stability and pain    Time  6    Period  Weeks    Status  New    Target Date  01/25/20      PT LONG TERM GOAL #5   Title  Pt to demo ability for full lumbar ROM wtihout pain, to improve ability for ADLS. and IADLs.    Time  6    Period  Weeks    Status  New    Target Date  01/25/20            Plan - 12/23/19 1642    Clinical Impression Statement  Pt improving wtih lumbar flexion ROM, gait, and ease of transfers. Pain at R low lumbar region today, but less tenderness in central spine with PAs. Pt able to progress to extension in supine, still painful in standing. Plan to progress as tolerated.    Personal Factors and Comorbidities  Past/Current Experience    Examination-Activity Limitations  Locomotion Level;Bathing;Transfers;Bed Mobility;Bend;Sit;Squat;Stairs;Stand;Lift    Examination-Participation Restrictions  Meal Prep;Cleaning;Community Activity;Driving;Shop;Laundry;Yard Work    Stability/Clinical Decision Making  Stable/Uncomplicated    Rehab Potential  Good  PT Frequency  2x / week    PT Duration  6 weeks    PT Treatment/Interventions  ADLs/Self Care Home Management;Cryotherapy;Electrical Stimulation;Gait training;DME Instruction;Ultrasound;Traction;Moist Heat;Iontophoresis 4mg /ml Dexamethasone;Stair training;Functional mobility training;Therapeutic activities;Therapeutic exercise;Balance training;Neuromuscular re-education;Manual techniques;Patient/family education;Passive range of motion;Orthotic Fit/Training;Dry needling;Taping;Joint Manipulations;Spinal Manipulations    Consulted and Agree with Plan of Care  Patient       Patient will benefit from skilled therapeutic intervention in order to improve the following deficits and impairments:  Abnormal gait, Decreased  range of motion, Difficulty walking, Obesity, Increased muscle spasms, Decreased safety awareness, Decreased activity tolerance, Pain, Improper body mechanics, Impaired flexibility, Decreased mobility, Decreased strength  Visit Diagnosis: Acute bilateral low back pain, unspecified whether sciatica present     Problem List Patient Active Problem List   Diagnosis Date Noted  . Bipolar 1 disorder, depressed, severe (HCC) 05/26/2019  . Cervical intraepithelial neoplasia grade 1 12/07/2018    12/09/2018, PT, DPT 4:47 PM  12/23/19    [facility] [address] [place], [place], [place] Phone: [phone]   Fax:  [phone]  Name: Sarah Cardenas MRN: Evangeline Dakin Date of Birth: 1986/01/13

## 2019-12-27 ENCOUNTER — Ambulatory Visit: Payer: No Typology Code available for payment source | Attending: Internal Medicine

## 2019-12-27 DIAGNOSIS — Z23 Encounter for immunization: Secondary | ICD-10-CM | POA: Insufficient documentation

## 2019-12-27 NOTE — Progress Notes (Signed)
   Covid-19 Vaccination Clinic  Name:  Sarah Cardenas    MRN: [id number] DOB: 1986-10-21  12/27/2019  [name] was observed post Covid-19 immunization for 15 minutes without incident. She was provided with Vaccine Information Sheet and instruction to access the V-Safe system.   [name] was instructed to call 911 with any severe reactions post vaccine: Marland Kitchen Difficulty breathing  . Swelling of face and throat  . A fast heartbeat  . A bad rash all over body  . Dizziness and weakness   Immunizations Administered    Name Date Dose VIS Date Route   Pfizer COVID-19 Vaccine 12/27/2019  2:06 PM 0.3 mL 10/01/2019 Intramuscular   Manufacturer: [employer], [employer]   Lot: [id number]   NDC: [address]-3

## 2019-12-28 ENCOUNTER — Ambulatory Visit (INDEPENDENT_AMBULATORY_CARE_PROVIDER_SITE_OTHER): Payer: No Typology Code available for payment source | Admitting: Physical Therapy

## 2019-12-28 ENCOUNTER — Encounter: Payer: Self-pay | Admitting: Physical Therapy

## 2019-12-28 ENCOUNTER — Other Ambulatory Visit: Payer: Self-pay

## 2019-12-28 DIAGNOSIS — M545 Low back pain, unspecified: Secondary | ICD-10-CM

## 2019-12-28 NOTE — Therapy (Signed)
[facility] [address] [place], [place], [address] Phone: [phone]   Fax:  [phone]  Physical Therapy Treatment  Patient Details  Name: Sarah Cardenas MRN: [id number] Date of Birth: 15-Aug-1986 Referring Provider (PT): Clementeen Graham   Encounter Date: 12/28/2019  PT End of Session - 12/28/19 1446    Visit Number  5    Number of Visits  12    Date for PT Re-Evaluation  01/25/20    Authorization Type  UHC    PT Start Time  1435    PT Stop Time  1515    PT Time Calculation (min)  40 min    Activity Tolerance  Patient tolerated treatment well    Behavior During Therapy  [facility] for tasks assessed/performed       Past Medical History:  Diagnosis Date  . Anxiety   . Bipolar affect, depressed (HCC)   . Depression     Past Surgical History:  Procedure Laterality Date  . BREAST SURGERY     lump removed  . ear drum replacement     right  . FRACTURE SURGERY      There were no vitals filed for this visit.  Subjective Assessment - 12/28/19 1445    Subjective  Pt states pain in back is better, but constant ache across low back. Has been able to return to work.    Currently in Pain?  Yes    Pain Score  6     Pain Location  Back    Pain Orientation  Right;Left    Pain Descriptors / Indicators  Aching    Pain Type  Acute pain    Pain Onset  1 to 4 weeks ago    Pain Frequency  Intermittent                       [facility] Adult PT Treatment/Exercise - 12/28/19 1447      Posture/Postural Control   Posture Comments  increased lordosis in standing and sittig       Exercises   Exercises  Lumbar      Lumbar Exercises: Stretches   Active Hamstring Stretch  --    Single Knee to Chest Stretch  --    Lower Trunk Rotation  5 reps;10 seconds    Lower Trunk Rotation Limitations  --    Pelvic Tilt  20 reps    Figure 4 Stretch  3 reps;30 seconds    Figure 4 Stretch Limitations  seated on R     Other Lumbar Stretch  Exercise  Standing QL stretch 30 sec x2 bil;     Other Lumbar Stretch Exercise  Childs pose 30 sec x3;       Lumbar Exercises: Aerobic   Stationary Bike  L1 x 7 min;       Lumbar Exercises: Standing   Other Standing Lumbar Exercises  March x20,   Hip abd 2x10 bil;       Lumbar Exercises: Supine   Ab Set  --    Clam  20 reps    Clam Limitations  GTB wtih TA    Bent Knee Raise  20 reps    Bent Knee Raise Limitations  with TA    Bridge  15 reps    Straight Leg Raise  10 reps      Lumbar Exercises: Sidelying   Hip Abduction  --      Lumbar Exercises: Quadruped   [place]  [place]  20 reps      Modalities   Modalities  --      Moist Heat Therapy   Moist Heat Location  --      Electrical Stimulation   Electrical Stimulation Location  R lumbar    Electrical Stimulation Goals  Pain      Manual Therapy   Manual Therapy  Soft tissue mobilization;Joint mobilization;Manual Traction;Muscle Energy Technique    Joint Mobilization  PA mobs lumbar gr 3     Soft tissue mobilization  STM/DTM to bil lumbar    Manual Traction  Long leg distraction x2 min on R                PT Short Term Goals - 12/15/19 1335      PT SHORT TERM GOAL #1   Title  Pt to be independent with initial HEP    Time  2    Period  Weeks    Status  New    Target Date  12/28/19      PT SHORT TERM GOAL #2   Title  Pt to report decreased pain in back to 0-4/10    Time  2    Period  Weeks    Status  New    Target Date  12/28/19        PT Long Term Goals - 12/15/19 1336      PT LONG TERM GOAL #1   Title  Pt to be independent with final HEP    Time  6    Period  Weeks    Status  New    Target Date  01/25/20      PT LONG TERM GOAL #2   Title  Pt to report decreased pain in low back to 0-2/10 with activity    Time  6    Period  Weeks    Status  New    Target Date  01/25/20      PT LONG TERM GOAL #3   Title  Pt to demo ability for correct mechanics with bend, squat, lift for safety with low  back and functional activity    Time  6    Period  Weeks    Status  New    Target Date  01/25/20      PT LONG TERM GOAL #4   Title  Pt to demo improved strength of bil Hips and core to at least 4+/5 to improve stability and pain    Time  6    Period  Weeks    Status  New    Target Date  01/25/20      PT LONG TERM GOAL #5   Title  Pt to demo ability for full lumbar ROM wtihout pain, to improve ability for ADLS. and IADLs.    Time  6    Period  Weeks    Status  New    Target Date  01/25/20            Plan - 12/28/19 1647    Clinical Impression Statement  Pt improving with ability for activity, and ease of movement, as well as ROM. Pt able to progress ther ex with mild soreness. Pt continues to report constant sorness across low back. Discussed increasing frequency of HEP if needed, for pain relief.    Personal Factors and Comorbidities  Past/Current Experience    Examination-Activity Limitations  Locomotion Level;Bathing;Transfers;Bed Mobility;Bend;Sit;Squat;Stairs;Stand;Lift    Examination-Participation Restrictions  Meal Prep;Cleaning;Community Activity;Driving;Shop;Laundry;[employer]  Work    Stability/Clinical Decision Making  Stable/Uncomplicated    Rehab Potential  Good    PT Frequency  2x / week    PT Duration  6 weeks    PT Treatment/Interventions  ADLs/Self Care Home Management;Cryotherapy;Electrical Stimulation;Gait training;DME Instruction;Ultrasound;Traction;Moist Heat;Iontophoresis 4mg /ml Dexamethasone;Stair training;Functional mobility training;Therapeutic activities;Therapeutic exercise;Balance training;Neuromuscular re-education;Manual techniques;Patient/family education;Passive range of motion;Orthotic Fit/Training;Dry needling;Taping;Joint Manipulations;Spinal Manipulations    Consulted and Agree with Plan of Care  Patient       Patient will benefit from skilled therapeutic intervention in order to improve the following deficits and impairments:  Abnormal gait,  Decreased range of motion, Difficulty walking, Obesity, Increased muscle spasms, Decreased safety awareness, Decreased activity tolerance, Pain, Improper body mechanics, Impaired flexibility, Decreased mobility, Decreased strength  Visit Diagnosis: Acute bilateral low back pain, unspecified whether sciatica present     Problem List Patient Active Problem List   Diagnosis Date Noted  . Bipolar 1 disorder, depressed, severe (HCC) 05/26/2019  . Cervical intraepithelial neoplasia grade 1 12/07/2018   12/09/2018, PT, DPT 4:48 PM  12/28/19    [place] [facility] [address] [place], [place], [place] Phone: [phone]   Fax:  [phone]  Name: Sarah Cardenas MRN: Evangeline Dakin Date of Birth: 18-Jan-1986

## 2019-12-29 ENCOUNTER — Encounter: Payer: Self-pay | Admitting: Family Medicine

## 2019-12-30 ENCOUNTER — Ambulatory Visit (INDEPENDENT_AMBULATORY_CARE_PROVIDER_SITE_OTHER): Payer: No Typology Code available for payment source | Admitting: Family Medicine

## 2019-12-30 ENCOUNTER — Encounter: Payer: Self-pay | Admitting: Family Medicine

## 2019-12-30 ENCOUNTER — Other Ambulatory Visit: Payer: Self-pay

## 2019-12-30 ENCOUNTER — Encounter: Payer: Self-pay | Admitting: Physical Therapy

## 2019-12-30 ENCOUNTER — Ambulatory Visit (INDEPENDENT_AMBULATORY_CARE_PROVIDER_SITE_OTHER): Payer: No Typology Code available for payment source | Admitting: Physical Therapy

## 2019-12-30 ENCOUNTER — Ambulatory Visit (INDEPENDENT_AMBULATORY_CARE_PROVIDER_SITE_OTHER): Payer: No Typology Code available for payment source

## 2019-12-30 VITALS — BP 120/72 | HR 97 | Ht 64.0 in | Wt 283.2 lb

## 2019-12-30 DIAGNOSIS — M5442 Lumbago with sciatica, left side: Secondary | ICD-10-CM

## 2019-12-30 DIAGNOSIS — M5441 Lumbago with sciatica, right side: Secondary | ICD-10-CM

## 2019-12-30 DIAGNOSIS — M545 Low back pain, unspecified: Secondary | ICD-10-CM

## 2019-12-30 DIAGNOSIS — M5416 Radiculopathy, lumbar region: Secondary | ICD-10-CM

## 2019-12-30 MED ORDER — GABAPENTIN 300 MG PO CAPS: ORAL_CAPSULE | ORAL | 1 refills | Status: DC

## 2019-12-30 NOTE — Progress Notes (Signed)
I, Sarah Cardenas, LAT, ATC, am serving as scribe for Dr. Lynne Leader.  Sarah Cardenas is a 34 y.o. female who presents to [place] [facility] today for f/u of low back pain.  She last saw Dr. Georgina Snell on 12/13/19 and was referred to outpatient PT of which she has completed 5 sessions.  She was prescribed Flexeril 10 mg and advised to use a TENs unit.  Since her last visit, she reports that she con't to be in a lot of pain, rating her pain at a constant 6-7/10.  She states that PT has helped w/ her muscle spasms but not so much w/ the pain.  She states that she has radiating pain from her low back to her B ant-lat thighs.  She denies any numbness/tingling or weakness into her B LEs.  She con't to take the Baclofen but has completed her steroid dose pack.    Diagnostic imaging: L-spine MRI on 03/11/19;   Pertinent review of systems: No fevers or chills  Relevant historical information: Morbid obesity.  Bipolar disorder well managed   Exam:  BP 120/72 (BP Location: Left Arm, Patient Position: Sitting, Cuff Size: Large)   Pulse 97   Ht 5\' 4"  (1.626 m)   Wt 283 lb 3.2 oz (128.5 kg)   SpO2 97%   BMI 48.61 kg/m  General: Well Developed, well nourished, and in no acute distress.   MSK:  L-spine: Normal-appearing Nontender spinal midline.  Tender palpation bilateral lumbar paraspinal musculature. Decreased lumbar motion. Normal lower extremity strength sensation and reflexes. Hips bilaterally normal motion no pain with internal rotation and flexion.    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  No obvious abnormality around L1 or L2 or L3  AP pelvis: No significant femoral acetabular abnormality  Await formal radiology review  Assessment and Plan: 34 y.o. female with continued low back pain with new pain in anterior  thighs possible L to lumbar radiculopathy.  Effectively at this point patient has worsening or progressive neurological symptoms attributable to her back pain complaint.  Next step is MRI L-spine to further evaluate potential radicular pain.  Plan for MRI and recheck following MRI.  We will also prescribe gabapentin for nighttime pain due to the radiculopathy.   PDMP not reviewed this encounter. Orders Placed This Encounter  Procedures  . DG Lumbar Spine Complete    Standing Status:   Future    Number of Occurrences:   1    Standing Expiration Date:   02/28/2021    Order Specific Question:   Reason for Exam (SYMPTOM  OR DIAGNOSIS REQUIRED)    Answer:   eval pain Lspine and poss L2 radicuopathy    Order Specific Question:   Is patient pregnant?    Answer:   No    Order Specific Question:   Preferred imaging location?    Answer:   [facility]    Order Specific Question:   Radiology Contrast Protocol - do NOT remove file path    Answer:   \\[email or web address]\epicdata\Radiant\DXFluoroContrastProtocols.pdf  . DG Pelvis 1-2 Views    Standing Status:   Future    Number of Occurrences:   1    Standing Expiration Date:   02/28/2021    Order Specific Question:   Reason for Exam (SYMPTOM  OR DIAGNOSIS REQUIRED)    Answer:  BL anterior hip/thigh pain    Order Specific Question:   Is patient pregnant?    Answer:   No    Order Specific Question:   Preferred imaging location?    Answer:   [facility]    Order Specific Question:   Radiology Contrast Protocol - do NOT remove file path    Answer:   \\[email or web address]\epicdata\Radiant\DXFluoroContrastProtocols.pdf  . MR Lumbar Spine Wo Contrast    Standing Status:   Future    Standing Expiration Date:   02/28/2021    Order Specific Question:   ** REASON FOR EXAM (FREE TEXT)    Answer:   poss L2 radiculopathy. Worsening low back pain    Order Specific Question:   What is the patient's sedation requirement?    Answer:   No Sedation    Order Specific  Question:   Does the patient have a pacemaker or implanted devices?    Answer:   No    Order Specific Question:   Preferred imaging location?    Answer:   [facility] (table limit-350lbs)    Order Specific Question:   Radiology Contrast Protocol - do NOT remove file path    Answer:   \\charchive\epicdata\Radiant\mriPROTOCOL.PDF   Meds ordered this encounter  Medications  . gabapentin (NEURONTIN) 300 MG capsule    Sig: One tab PO qHS for a week, then BID for a week, then TID. May double weekly to a max of 3,600mg /day    Dispense:  180 capsule    Refill:  1     Discussed warning signs or symptoms. Please see discharge instructions. Patient expresses understanding.   The above documentation has been reviewed and is accurate and complete Clementeen Graham

## 2019-12-30 NOTE — Patient Instructions (Signed)
Thank you for coming in today. Get xray today.  Plan for MRI soon. Recommend scheduling it next weekend.  Schedule follow up appt with me after MRI.  Get a CD made of the MRI images.  Try gabapentin for nerve pain at bedtime. OK to increase to 3x daily as needed for nerve pain.

## 2019-12-30 NOTE — Progress Notes (Signed)
X-ray lumbar spine shows narrowing at L5 and S1.  This will be much better visualized on MRI.

## 2019-12-30 NOTE — Therapy (Signed)
[facility] [address] [place], [place], [address] Phone: [phone]   Fax:  [phone]  Physical Therapy Treatment  Patient Details  Name: Sarah Cardenas MRN: [id number] Date of Birth: 1985-11-08 Referring Provider (PT): Lynne Leader   Encounter Date: 12/30/2019  PT End of Session - 12/30/19 1527    Visit Number  6    Number of Visits  12    Date for PT Re-Evaluation  01/25/20    Authorization Type  UHC    PT Start Time  1751    PT Stop Time  1555    PT Time Calculation (min)  40 min    Activity Tolerance  Patient tolerated treatment well    Behavior During Therapy  [facility] for tasks assessed/performed       Past Medical History:  Diagnosis Date  . Anxiety   . Bipolar affect, depressed ([place])   . Depression     Past Surgical History:  Procedure Laterality Date  . BREAST SURGERY     lump removed  . ear drum replacement     right  . FRACTURE SURGERY      There were no vitals filed for this visit.  Subjective Assessment - 12/30/19 1522    Subjective  Pt states pain is not improving. ALso notes increased pain into bil hips. Thinks she is moving better, but pain has been constant.    Currently in Pain?  Yes    Pain Score  6     Pain Location  Back    Pain Orientation  Right;Left    Pain Descriptors / Indicators  Aching    Pain Type  Acute pain    Pain Onset  1 to 4 weeks ago    Pain Frequency  Intermittent                       [facility] Adult PT Treatment/Exercise - 12/30/19 0001      Lumbar Exercises: Stretches   Active Hamstring Stretch  30 seconds;3 reps;Right;Left    Single Knee to Chest Stretch  2 reps;30 seconds    Pelvic Tilt  20 reps    Other Lumbar Stretch Exercise  Standing QL stretch 30 sec x2 bil;     Other Lumbar Stretch Exercise  Supine hip flexor stretch off side of table x1 min bil;       Lumbar Exercises: Aerobic   Stationary Bike  L1 x 5 min (pain)       Lumbar Exercises:  Supine   Clam  20 reps    Clam Limitations  GTB wtih TA    Bent Knee Raise  20 reps    Bent Knee Raise Limitations  with TA      Lumbar Exercises: Prone   Other Prone Lumbar Exercises  Prone press ups x10; Prone on elbows x1 min, Prone on 1 pillow x1 min.       Manual Therapy   Joint Mobilization  PA mobs lumbar gr2- 3 (pain)     Soft tissue mobilization  STM/DTM to bil lumbar               PT Short Term Goals - 12/15/19 1335      PT SHORT TERM GOAL #1   Title  Pt to be independent with initial HEP    Time  2    Period  Weeks    Status  New    Target Date  12/28/19  PT SHORT TERM GOAL #2   Title  Pt to report decreased pain in back to 0-4/10    Time  2    Period  Weeks    Status  New    Target Date  12/28/19        PT Long Term Goals - 12/15/19 1336      PT LONG TERM GOAL #1   Title  Pt to be independent with final HEP    Time  6    Period  Weeks    Status  New    Target Date  01/25/20      PT LONG TERM GOAL #2   Title  Pt to report decreased pain in low back to 0-2/10 with activity    Time  6    Period  Weeks    Status  New    Target Date  01/25/20      PT LONG TERM GOAL #3   Title  Pt to demo ability for correct mechanics with bend, squat, lift for safety with low back and functional activity    Time  6    Period  Weeks    Status  New    Target Date  01/25/20      PT LONG TERM GOAL #4   Title  Pt to demo improved strength of bil Hips and core to at least 4+/5 to improve stability and pain    Time  6    Period  Weeks    Status  New    Target Date  01/25/20      PT LONG TERM GOAL #5   Title  Pt to demo ability for full lumbar ROM wtihout pain, to improve ability for ADLS. and IADLs.    Time  6    Period  Weeks    Status  New    Target Date  01/25/20            Plan - 12/30/19 1611    Clinical Impression Statement  Pt with continued soreness. More difficulty today with ther ex and transfers. Trial for lumbar extension, pt with  increased pain. Seems to have more relief with flexion, will continue wiht flexion Cardenas program. No LLTT with SLR. Plan to progress as tolerated. Pt has MRI ordered, not scheduled yet.    Personal Factors and Comorbidities  Past/Current Experience    Examination-Activity Limitations  Locomotion Level;Bathing;Transfers;Bed Mobility;Bend;Sit;Squat;Stairs;Stand;Lift    Examination-Participation Restrictions  Meal Prep;Cleaning;Community Activity;Driving;Shop;Laundry;Yard Work    Stability/Clinical Decision Making  Stable/Uncomplicated    Rehab Potential  Good    PT Frequency  2x / week    PT Duration  6 weeks    PT Treatment/Interventions  ADLs/Self Care Home Management;Cryotherapy;Electrical Stimulation;Gait training;DME Instruction;Ultrasound;Traction;Moist Heat;Iontophoresis 4mg /ml Dexamethasone;Stair training;Functional mobility training;Therapeutic activities;Therapeutic exercise;Balance training;Neuromuscular re-education;Manual techniques;Patient/family education;Passive range of motion;Orthotic Fit/Training;Dry needling;Taping;Joint Manipulations;Spinal Manipulations    Consulted and Agree with Plan of Care  Patient       Patient will benefit from skilled therapeutic intervention in order to improve the following deficits and impairments:  Abnormal gait, Decreased range of motion, Difficulty walking, Obesity, Increased muscle spasms, Decreased safety awareness, Decreased activity tolerance, Pain, Improper body mechanics, Impaired flexibility, Decreased mobility, Decreased strength  Visit Diagnosis: Acute bilateral low back pain, unspecified whether sciatica present     Problem List Patient Active Problem List   Diagnosis Date Noted  . Morbid obesity (HCC) 12/30/2019  . Bipolar 1 disorder, depressed, severe (HCC) 05/26/2019  . Cervical intraepithelial  neoplasia grade 1 12/07/2018    Sedalia Muta, PT, DPT 4:12 PM  12/30/19    [facility]  [address] [place], [place], [address] Phone: [phone]   Fax:  [phone]  Name: Sarah Cardenas MRN: [id number] Date of Birth: 09/10/1986

## 2019-12-30 NOTE — Progress Notes (Signed)
X-ray hips show possible femoral acetabular impingement changes.  This can sometimes cause pain in the front of the hips typically with lots of hip flexion.  Can look into this further in the future if needed.

## 2020-01-04 ENCOUNTER — Other Ambulatory Visit: Payer: Self-pay

## 2020-01-04 ENCOUNTER — Encounter: Payer: Self-pay | Admitting: Physical Therapy

## 2020-01-04 ENCOUNTER — Ambulatory Visit (INDEPENDENT_AMBULATORY_CARE_PROVIDER_SITE_OTHER): Payer: No Typology Code available for payment source | Admitting: Physical Therapy

## 2020-01-04 DIAGNOSIS — M545 Low back pain, unspecified: Secondary | ICD-10-CM

## 2020-01-05 NOTE — Therapy (Signed)
[facility] [address] [place], [place], [address] Phone: [phone]   Fax:  [phone]  Physical Therapy Treatment  Patient Details  Name: Sarah Cardenas MRN: [id number] Date of Birth: 1985-11-07 Referring Provider (PT): Lynne Leader   Encounter Date: 01/04/2020  PT End of Session - 01/04/20 1440    Visit Number  7    Number of Visits  12    Date for PT Re-Evaluation  01/25/20    Authorization Type  UHC    PT Start Time  5701    PT Stop Time  1513    PT Time Calculation (min)  40 min    Activity Tolerance  Patient tolerated treatment well    Behavior During Therapy  [facility] for tasks assessed/performed       Past Medical History:  Diagnosis Date  . Anxiety   . Bipolar affect, depressed ([place])   . Depression     Past Surgical History:  Procedure Laterality Date  . BREAST SURGERY     lump removed  . ear drum replacement     right  . FRACTURE SURGERY      There were no vitals filed for this visit.  Subjective Assessment - 01/04/20 1437    Subjective  Pt states pretty good day yesterday, sore today. Has been able to work. No MRI scheduled yet.    Currently in Pain?  Yes    Pain Score  5     Pain Location  Back    Pain Orientation  Right;Left    Pain Descriptors / Indicators  Aching    Pain Type  Acute pain    Pain Onset  More than a month ago    Pain Frequency  Intermittent                       [facility] Adult PT Treatment/Exercise - 01/04/20 1441      Lumbar Exercises: Stretches   Single Knee to Chest Stretch  2 reps;30 seconds    Pelvic Tilt  20 reps    Other Lumbar Stretch Exercise  Standing QL stretch 30 sec x2 bil;     Other Lumbar Stretch Exercise  Childs pose 30 sec x 4;       Lumbar Exercises: Aerobic   Stationary Bike  L1 x 7 min        Lumbar Exercises: Standing   Other Standing Lumbar Exercises  March x20,   Hip abd 2x10 bil;  Hip Ext 2x10 bil;       Lumbar Exercises: Supine    Clam  20 reps    Clam Limitations  GTB wtih TA    Bent Knee Raise  20 reps    Bent Knee Raise Limitations  with TA      Lumbar Exercises: Sidelying   Hip Abduction  10 reps;Both      Lumbar Exercises: Prone   Other Prone Lumbar Exercises  --      Lumbar Exercises: Quadruped   Madcat/Old Horse  20 reps    Single Arm Raise  15 reps      Manual Therapy   Joint Mobilization  pain with attempts for PA mobs/lumbar     Soft tissue mobilization  --    Manual Traction  Long leg distraction x2 min bil               PT Short Term Goals - 01/04/20 1440      PT  SHORT TERM GOAL #1   Title  Pt to be independent with initial HEP    Time  2    Period  Weeks    Status  Achieved    Target Date  12/28/19      PT SHORT TERM GOAL #2   Title  Pt to report decreased pain in back to 0-4/10    Time  2    Period  Weeks    Status  On-going    Target Date  12/28/19        PT Long Term Goals - 12/15/19 1336      PT LONG TERM GOAL #1   Title  Pt to be independent with final HEP    Time  6    Period  Weeks    Status  New    Target Date  01/25/20      PT LONG TERM GOAL #2   Title  Pt to report decreased pain in low back to 0-2/10 with activity    Time  6    Period  Weeks    Status  New    Target Date  01/25/20      PT LONG TERM GOAL #3   Title  Pt to demo ability for correct mechanics with bend, squat, lift for safety with low back and functional activity    Time  6    Period  Weeks    Status  New    Target Date  01/25/20      PT LONG TERM GOAL #4   Title  Pt to demo improved strength of bil Hips and core to at least 4+/5 to improve stability and pain    Time  6    Period  Weeks    Status  New    Target Date  01/25/20      PT LONG TERM GOAL #5   Title  Pt to demo ability for full lumbar ROM wtihout pain, to improve ability for ADLS. and IADLs.    Time  6    Period  Weeks    Status  New    Target Date  01/25/20            Plan - 01/05/20 0830    Clinical  Impression Statement  Pt with improving ability for ROM and ther ex, does have soreness with attempts for extension, laying prone, and with PA mobs today.Pt states relief with flexion ROM and stretching. Plan to progress strength as tolerated.    Personal Factors and Comorbidities  Past/Current Experience    Examination-Activity Limitations  Locomotion Level;Bathing;Transfers;Bed Mobility;Bend;Sit;Squat;Stairs;Stand;Lift    Examination-Participation Restrictions  Meal Prep;Cleaning;Community Activity;Driving;Shop;Laundry;Yard Work    Stability/Clinical Decision Making  Stable/Uncomplicated    Rehab Potential  Good    PT Frequency  2x / week    PT Duration  6 weeks    PT Treatment/Interventions  ADLs/Self Care Home Management;Cryotherapy;Electrical Stimulation;Gait training;DME Instruction;Ultrasound;Traction;Moist Heat;Iontophoresis 4mg /ml Dexamethasone;Stair training;Functional mobility training;Therapeutic activities;Therapeutic exercise;Balance training;Neuromuscular re-education;Manual techniques;Patient/family education;Passive range of motion;Orthotic Fit/Training;Dry needling;Taping;Joint Manipulations;Spinal Manipulations    Consulted and Agree with Plan of Care  Patient       Patient will benefit from skilled therapeutic intervention in order to improve the following deficits and impairments:  Abnormal gait, Decreased range of motion, Difficulty walking, Obesity, Increased muscle spasms, Decreased safety awareness, Decreased activity tolerance, Pain, Improper body mechanics, Impaired flexibility, Decreased mobility, Decreased strength  Visit Diagnosis: Acute bilateral low back pain, unspecified whether sciatica present  Problem List Patient Active Problem List   Diagnosis Date Noted  . Morbid obesity (HCC) 12/30/2019  . Bipolar 1 disorder, depressed, severe (HCC) 05/26/2019  . Cervical intraepithelial neoplasia grade 1 12/07/2018    Sedalia Muta, PT, DPT 8:33 AM   01/05/20    [facility] [address] [place], [place], [address] Phone: [phone]   Fax:  [phone]  Name: Kiarah Eckstein MRN: [id number] Date of Birth: 06-Feb-1986

## 2020-01-06 ENCOUNTER — Encounter: Payer: No Typology Code available for payment source | Admitting: Physical Therapy

## 2020-01-11 ENCOUNTER — Ambulatory Visit: Payer: No Typology Code available for payment source | Admitting: Family Medicine

## 2020-01-11 ENCOUNTER — Encounter: Payer: Self-pay | Admitting: Physical Therapy

## 2020-01-11 ENCOUNTER — Ambulatory Visit (INDEPENDENT_AMBULATORY_CARE_PROVIDER_SITE_OTHER): Payer: No Typology Code available for payment source | Admitting: Physical Therapy

## 2020-01-11 ENCOUNTER — Encounter: Payer: No Typology Code available for payment source | Admitting: Physical Therapy

## 2020-01-11 ENCOUNTER — Telehealth: Payer: Self-pay | Admitting: Family Medicine

## 2020-01-11 ENCOUNTER — Other Ambulatory Visit: Payer: Self-pay

## 2020-01-11 DIAGNOSIS — M545 Low back pain, unspecified: Secondary | ICD-10-CM

## 2020-01-11 NOTE — Therapy (Signed)
[facility] [address] [place], [place], [address] Phone: [phone]   Fax:  [phone]  Physical Therapy Treatment  Patient Details  Name: Sarah Cardenas MRN: [id number] Date of Birth: [birth year] Referring Provider (PT): Lynne Leader   Encounter Date: 01/11/2020  PT End of Session - 01/11/20 1221    Visit Number  8    Number of Visits  12    Date for PT Re-Evaluation  01/25/20    Authorization Type  UHC    PT Start Time  1216    PT Stop Time  1257    PT Time Calculation (min)  41 min    Activity Tolerance  Patient tolerated treatment well    Behavior During Therapy  [facility] for tasks assessed/performed       Past Medical History:  Diagnosis Date  . Anxiety   . Bipolar affect, depressed ([place])   . Depression     Past Surgical History:  Procedure Laterality Date  . BREAST SURGERY     lump removed  . ear drum replacement     right  . FRACTURE SURGERY      There were no vitals filed for this visit.  Subjective Assessment - 01/11/20 1219    Subjective  Pt states shes doing ok. Still sore, but not too bad today. Has MRI scheduled for 3/28 (sunday)    Currently in Pain?  Yes    Pain Score  4     Pain Location  Back    Pain Orientation  Right;Left    Pain Descriptors / Indicators  Aching    Pain Type  Acute pain    Pain Onset  More than a month ago    Pain Frequency  Intermittent                       [facility] Adult PT Treatment/Exercise - 01/11/20 1222      Lumbar Exercises: Stretches   Single Knee to Chest Stretch  2 reps;30 seconds    Lower Trunk Rotation  5 reps;10 seconds    Pelvic Tilt  20 reps    Figure 4 Stretch  3 reps;30 seconds    Figure 4 Stretch Limitations  seated    Other Lumbar Stretch Exercise  Standing QL stretch 30 sec x2 bil;     Other Lumbar Stretch Exercise  Childs pose 30 sec x 4;       Lumbar Exercises: Aerobic   Stationary Bike  L1 x 8 min        Lumbar Exercises:  Standing   Row  20 reps    Theraband Level (Row)  Level 3 (Green)    Other Standing Lumbar Exercises  March x20,   Hip abd 2x10 bil;      Other Standing Lumbar Exercises  Trunk rotation YTB x15 bil;       Lumbar Exercises: Seated   Sit to Stand  15 reps    Sit to Stand Limitations  for education on form, and squat technique      Lumbar Exercises: Supine   Clam  20 reps    Clam Limitations  GTB wtih TA, alternating     Bent Knee Raise  20 reps    Bent Knee Raise Limitations  with TA    Bridge  15 reps    Straight Leg Raise  15 reps    Straight Leg Raises Limitations  bil, with TA  Lumbar Exercises: Sidelying   Hip Abduction  --      Lumbar Exercises: Quadruped   Madcat/Old Horse  20 reps    Single Arm Raise  15 reps      Manual Therapy   Joint Mobilization  --    Manual Traction  Long leg distraction x2 min bil               PT Short Term Goals - 01/04/20 1440      PT SHORT TERM GOAL #1   Title  Pt to be independent with initial HEP    Time  2    Period  Weeks    Status  Achieved    Target Date  12/28/19      PT SHORT TERM GOAL #2   Title  Pt to report decreased pain in back to 0-4/10    Time  2    Period  Weeks    Status  On-going    Target Date  12/28/19        PT Long Term Goals - 12/15/19 1336      PT LONG TERM GOAL #1   Title  Pt to be independent with final HEP    Time  6    Period  Weeks    Status  New    Target Date  01/25/20      PT LONG TERM GOAL #2   Title  Pt to report decreased pain in low back to 0-2/10 with activity    Time  6    Period  Weeks    Status  New    Target Date  01/25/20      PT LONG TERM GOAL #3   Title  Pt to demo ability for correct mechanics with bend, squat, lift for safety with low back and functional activity    Time  6    Period  Weeks    Status  New    Target Date  01/25/20      PT LONG TERM GOAL #4   Title  Pt to demo improved strength of bil Hips and core to at least 4+/5 to improve stability  and pain    Time  6    Period  Weeks    Status  New    Target Date  01/25/20      PT LONG TERM GOAL #5   Title  Pt to demo ability for full lumbar ROM wtihout pain, to improve ability for ADLS. and IADLs.    Time  6    Period  Weeks    Status  New    Target Date  01/25/20            Plan - 01/11/20 2110    Clinical Impression Statement  Pt able to increase strengthening and ther ex today. No increased pain during session. Pain has been variable. Plan to progress strength and stabilization as tolerated.    Personal Factors and Comorbidities  Past/Current Experience    Examination-Activity Limitations  Locomotion Level;Bathing;Transfers;Bed Mobility;Bend;Sit;Squat;Stairs;Stand;Lift    Examination-Participation Restrictions  Meal Prep;Cleaning;Community Activity;Driving;Shop;Laundry;Yard Work    Stability/Clinical Decision Making  Stable/Uncomplicated    Rehab Potential  Good    PT Frequency  2x / week    PT Duration  6 weeks    PT Treatment/Interventions  ADLs/Self Care Home Management;Cryotherapy;Electrical Stimulation;Gait training;DME Instruction;Ultrasound;Traction;Moist Heat;Iontophoresis 4mg /ml Dexamethasone;Stair training;Functional mobility training;Therapeutic activities;Therapeutic exercise;Balance training;Neuromuscular re-education;Manual techniques;Patient/family education;Passive range of motion;Orthotic Fit/Training;Dry needling;Taping;Joint Manipulations;Spinal Manipulations  Consulted and Agree with Plan of Care  Patient       Patient will benefit from skilled therapeutic intervention in order to improve the following deficits and impairments:  Abnormal gait, Decreased range of motion, Difficulty walking, Obesity, Increased muscle spasms, Decreased safety awareness, Decreased activity tolerance, Pain, Improper body mechanics, Impaired flexibility, Decreased mobility, Decreased strength  Visit Diagnosis: Acute bilateral low back pain, unspecified whether sciatica  present     Problem List Patient Active Problem List   Diagnosis Date Noted  . Morbid obesity (HCC) 12/30/2019  . Bipolar 1 disorder, depressed, severe (HCC) 05/26/2019  . Cervical intraepithelial neoplasia grade 1 12/07/2018   Sedalia Muta, PT, DPT 9:12 PM  01/11/20    [facility] [address] [place], [place], [address] Phone: [phone]   Fax:  [phone]  Name: Sarah Cardenas MRN: [id number] Date of Birth: [birth year]

## 2020-01-11 NOTE — Telephone Encounter (Signed)
Pt scheduled MRI in [place] for 3/29. Dr. Denyse Amass is off next week, so we scheduled her follow up for 4/5.  Molly-could we look out for her results and give her a call next week so that she does not have to wait until 4/5 for results?

## 2020-01-13 ENCOUNTER — Ambulatory Visit (INDEPENDENT_AMBULATORY_CARE_PROVIDER_SITE_OTHER): Payer: No Typology Code available for payment source | Admitting: Physical Therapy

## 2020-01-13 ENCOUNTER — Encounter: Payer: Self-pay | Admitting: Physical Therapy

## 2020-01-13 DIAGNOSIS — M545 Low back pain, unspecified: Secondary | ICD-10-CM

## 2020-01-13 NOTE — Therapy (Signed)
[facility] [address] [place], [place], [address] Phone: [phone]   Fax:  [phone]  Physical Therapy Treatment  Patient Details  Name: Maki Hege MRN: [id number] Date of Birth: 14-Jun-1986 Referring Provider (PT): Clementeen Graham   Encounter Date: 01/13/2020  PT End of Session - 01/13/20 1440    Visit Number  9    Number of Visits  12    Date for PT Re-Evaluation  01/25/20    Authorization Type  UHC    PT Start Time  1435    PT Stop Time  1515    PT Time Calculation (min)  40 min    Activity Tolerance  Patient tolerated treatment well    Behavior During Therapy  [facility] for tasks assessed/performed       Past Medical History:  Diagnosis Date  . Anxiety   . Bipolar affect, depressed (HCC)   . Depression     Past Surgical History:  Procedure Laterality Date  . BREAST SURGERY     lump removed  . ear drum replacement     right  . FRACTURE SURGERY      There were no vitals filed for this visit.  Subjective Assessment - 01/13/20 1440    Subjective  Pt states less pain, still there, bothersome. No increased pain after last sesison.    Currently in Pain?  Yes    Pain Score  4     Pain Location  Back    Pain Orientation  Right;Left    Pain Descriptors / Indicators  Aching    Pain Type  Acute pain    Pain Onset  More than a month ago    Pain Frequency  Intermittent                       [facility] Adult PT Treatment/Exercise - 01/13/20 1441      Lumbar Exercises: Stretches   Single Knee to Chest Stretch  2 reps;30 seconds    Lower Trunk Rotation  5 reps;10 seconds    Pelvic Tilt  --    Figure 4 Stretch  --    Figure 4 Stretch Limitations  --    Other Lumbar Stretch Exercise  Standing QL stretch 30 sec x2 bil;     Other Lumbar Stretch Exercise  Childs pose 30 sec x 4;       Lumbar Exercises: Aerobic   Stationary Bike  L2 x 6 min        Lumbar Exercises: Standing   Functional Squats  20 reps    Functional Squats Limitations  with education on form, squat, lift, 5 lb and 10lb x5;     Row  20 reps    Theraband Level (Row)  Level 3 (Green)    Other Standing Lumbar Exercises  March x20,   Hip abd 2x10 bil;      Other Standing Lumbar Exercises  Trunk rotation YTB x15 bil;       Lumbar Exercises: Seated   Sit to Stand  --    Sit to Stand Limitations  --      Lumbar Exercises: Supine   Clam  20 reps    Clam Limitations  GTB wtih TA, alternating     Bridge  20 reps    Straight Leg Raise  10 reps    Straight Leg Raises Limitations  bil, with TA      Lumbar Exercises: Quadruped   Madcat/Old Horse  20 reps    Single Arm Raise  15 reps    Plank  high plank on knees 20 sec x 3;                 PT Short Term Goals - 01/04/20 1440      PT SHORT TERM GOAL #1   Title  Pt to be independent with initial HEP    Time  2    Period  Weeks    Status  Achieved    Target Date  12/28/19      PT SHORT TERM GOAL #2   Title  Pt to report decreased pain in back to 0-4/10    Time  2    Period  Weeks    Status  On-going    Target Date  12/28/19        PT Long Term Goals - 12/15/19 1336      PT LONG TERM GOAL #1   Title  Pt to be independent with final HEP    Time  6    Period  Weeks    Status  New    Target Date  01/25/20      PT LONG TERM GOAL #2   Title  Pt to report decreased pain in low back to 0-2/10 with activity    Time  6    Period  Weeks    Status  New    Target Date  01/25/20      PT LONG TERM GOAL #3   Title  Pt to demo ability for correct mechanics with bend, squat, lift for safety with low back and functional activity    Time  6    Period  Weeks    Status  New    Target Date  01/25/20      PT LONG TERM GOAL #4   Title  Pt to demo improved strength of bil Hips and core to at least 4+/5 to improve stability and pain    Time  6    Period  Weeks    Status  New    Target Date  01/25/20      PT LONG TERM GOAL #5   Title  Pt to demo ability for full  lumbar ROM wtihout pain, to improve ability for ADLS. and IADLs.    Time  6    Period  Weeks    Status  New    Target Date  01/25/20            Plan - 01/13/20 2116    Clinical Impression Statement  Pt able to progress ther ex and strengthening today, without increased pain. Pt challenged with most exercises,due to weakness. Pt to benefit from continued progression of strength and stabilization.    Personal Factors and Comorbidities  Past/Current Experience    Examination-Activity Limitations  Locomotion Level;Bathing;Transfers;Bed Mobility;Bend;Sit;Squat;Stairs;Stand;Lift    Examination-Participation Restrictions  Meal Prep;Cleaning;Community Activity;Driving;Shop;Laundry;Yard Work    Stability/Clinical Decision Making  Stable/Uncomplicated    Rehab Potential  Good    PT Frequency  2x / week    PT Duration  6 weeks    PT Treatment/Interventions  ADLs/Self Care Home Management;Cryotherapy;Electrical Stimulation;Gait training;DME Instruction;Ultrasound;Traction;Moist Heat;Iontophoresis 4mg /ml Dexamethasone;Stair training;Functional mobility training;Therapeutic activities;Therapeutic exercise;Balance training;Neuromuscular re-education;Manual techniques;Patient/family education;Passive range of motion;Orthotic Fit/Training;Dry needling;Taping;Joint Manipulations;Spinal Manipulations    Consulted and Agree with Plan of Care  Patient       Patient will benefit from skilled therapeutic intervention in order to improve the following  deficits and impairments:  Abnormal gait, Decreased range of motion, Difficulty walking, Obesity, Increased muscle spasms, Decreased safety awareness, Decreased activity tolerance, Pain, Improper body mechanics, Impaired flexibility, Decreased mobility, Decreased strength  Visit Diagnosis: Acute bilateral low back pain, unspecified whether sciatica present     Problem List Patient Active Problem List   Diagnosis Date Noted  . Morbid obesity (HCC)  12/30/2019  . Bipolar 1 disorder, depressed, severe (HCC) 05/26/2019  . Cervical intraepithelial neoplasia grade 1 12/07/2018    Sedalia Muta, PT, DPT 9:17 PM  01/13/20    [facility] [address] [place], [place], [address] Phone: [phone]   Fax:  [phone]  Name: Terie Lear MRN: [id number] Date of Birth: 02/09/1986

## 2020-01-16 ENCOUNTER — Other Ambulatory Visit: Payer: No Typology Code available for payment source

## 2020-01-17 NOTE — Telephone Encounter (Signed)
MRI rescheduled to 4/4, so we have moved her follow up appt to 4/7.

## 2020-01-18 ENCOUNTER — Encounter: Payer: Self-pay | Admitting: Physical Therapy

## 2020-01-18 ENCOUNTER — Ambulatory Visit (INDEPENDENT_AMBULATORY_CARE_PROVIDER_SITE_OTHER): Payer: No Typology Code available for payment source | Admitting: Physical Therapy

## 2020-01-18 ENCOUNTER — Other Ambulatory Visit: Payer: Self-pay

## 2020-01-18 DIAGNOSIS — M545 Low back pain, unspecified: Secondary | ICD-10-CM

## 2020-01-18 NOTE — Telephone Encounter (Signed)
Noted. Thanks Julie.

## 2020-01-18 NOTE — Therapy (Signed)
[facility] [address] [place], [place], [address] Phone: [phone]   Fax:  [phone]  Physical Therapy Treatment  Patient Details  Name: Sarah Cardenas MRN: [id number] Date of Birth: 1986-02-01 Referring Provider (PT): Clementeen Graham   Encounter Date: 01/18/2020  PT End of Session - 01/18/20 1453    Visit Number  10    Number of Visits  12    Date for PT Re-Evaluation  01/25/20    Authorization Type  UHC    PT Start Time  1432    PT Stop Time  1513    PT Time Calculation (min)  41 min    Activity Tolerance  Patient tolerated treatment well    Behavior During Therapy  [facility] for tasks assessed/performed       Past Medical History:  Diagnosis Date  . Anxiety   . Bipolar affect, depressed (HCC)   . Depression     Past Surgical History:  Procedure Laterality Date  . BREAST SURGERY     lump removed  . ear drum replacement     right  . FRACTURE SURGERY      There were no vitals filed for this visit.  Subjective Assessment - 01/18/20 1450    Subjective  MRI rescheduled for this weekend. Pt states mild/continued pain. Was able to walk yesterday    Currently in Pain?  Yes    Pain Score  4     Pain Location  Back    Pain Orientation  Right;Left    Pain Descriptors / Indicators  Aching    Pain Type  Acute pain                       [facility] Adult PT Treatment/Exercise - 01/18/20 1459      Lumbar Exercises: Stretches   Single Knee to Chest Stretch  2 reps;30 seconds    Lower Trunk Rotation  5 reps;10 seconds    Other Lumbar Stretch Exercise  Standing QL stretch 30 sec x2 bil;     Other Lumbar Stretch Exercise  Childs pose 30 sec x 4;       Lumbar Exercises: Aerobic   Stationary Bike  L2 x 6 min        Lumbar Exercises: Standing   Functional Squats  --    Functional Squats Limitations  --    Row  20 reps    Theraband Level (Row)  Level 3 (Green)    Other Standing Lumbar Exercises  March x20,   Hip  abd 2x10 YTB  bil;      Other Standing Lumbar Exercises  --      Lumbar Exercises: Supine   Clam  20 reps    Clam Limitations  GTB wtih TA, alternating     Bridge  20 reps    Straight Leg Raise  15 reps    Straight Leg Raises Limitations  bil, with TA      Lumbar Exercises: Quadruped   Madcat/Old Horse  20 reps    Single Arm Raise  --    Plank  high plank on knees 20 sec x 4;                 PT Short Term Goals - 01/04/20 1440      PT SHORT TERM GOAL #1   Title  Pt to be independent with initial HEP    Time  2    Period  Weeks    Status  Achieved    Target Date  12/28/19      PT SHORT TERM GOAL #2   Title  Pt to report decreased pain in back to 0-4/10    Time  2    Period  Weeks    Status  On-going    Target Date  12/28/19        PT Long Term Goals - 12/15/19 1336      PT LONG TERM GOAL #1   Title  Pt to be independent with final HEP    Time  6    Period  Weeks    Status  New    Target Date  01/25/20      PT LONG TERM GOAL #2   Title  Pt to report decreased pain in low back to 0-2/10 with activity    Time  6    Period  Weeks    Status  New    Target Date  01/25/20      PT LONG TERM GOAL #3   Title  Pt to demo ability for correct mechanics with bend, squat, lift for safety with low back and functional activity    Time  6    Period  Weeks    Status  New    Target Date  01/25/20      PT LONG TERM GOAL #4   Title  Pt to demo improved strength of bil Hips and core to at least 4+/5 to improve stability and pain    Time  6    Period  Weeks    Status  New    Target Date  01/25/20      PT LONG TERM GOAL #5   Title  Pt to demo ability for full lumbar ROM wtihout pain, to improve ability for ADLS. and IADLs.    Time  6    Period  Weeks    Status  New    Target Date  01/25/20            Plan - 01/18/20 1456    Clinical Impression Statement  Pt will wait until after MRI to return. Pt continues to have pain in center of back. Has been able to  increase activity and mobility. Flexion has been relieving , and extension continues to be painful. Will re-eval after MRI next week.    Personal Factors and Comorbidities  Past/Current Experience    Examination-Activity Limitations  Locomotion Level;Bathing;Transfers;Bed Mobility;Bend;Sit;Squat;Stairs;Stand;Lift    Examination-Participation Restrictions  Meal Prep;Cleaning;Community Activity;Driving;Shop;Laundry;Yard Work    Stability/Clinical Decision Making  Stable/Uncomplicated    Rehab Potential  Good    PT Frequency  2x / week    PT Duration  6 weeks    PT Treatment/Interventions  ADLs/Self Care Home Management;Cryotherapy;Electrical Stimulation;Gait training;DME Instruction;Ultrasound;Traction;Moist Heat;Iontophoresis 4mg /ml Dexamethasone;Stair training;Functional mobility training;Therapeutic activities;Therapeutic exercise;Balance training;Neuromuscular re-education;Manual techniques;Patient/family education;Passive range of motion;Orthotic Fit/Training;Dry needling;Taping;Joint Manipulations;Spinal Manipulations    Consulted and Agree with Plan of Care  Patient       Patient will benefit from skilled therapeutic intervention in order to improve the following deficits and impairments:  Abnormal gait, Decreased range of motion, Difficulty walking, Obesity, Increased muscle spasms, Decreased safety awareness, Decreased activity tolerance, Pain, Improper body mechanics, Impaired flexibility, Decreased mobility, Decreased strength  Visit Diagnosis: Acute bilateral low back pain, unspecified whether sciatica present     Problem List Patient Active Problem List   Diagnosis Date Noted  . Morbid obesity (HCC) 12/30/2019  .  Bipolar 1 disorder, depressed, severe ([place]) 05/26/2019  . Cervical intraepithelial neoplasia grade 1 12/07/2018   Lyndee Hensen, PT, DPT 9:21 PM  01/18/20    [facility] [place], [place],  [address] Phone: [phone]   Fax:  [phone]  Name: Sarah Cardenas MRN: [id number] Date of Birth: 29-Oct-1985

## 2020-01-20 ENCOUNTER — Encounter: Payer: No Typology Code available for payment source | Admitting: Physical Therapy

## 2020-01-23 ENCOUNTER — Ambulatory Visit (INDEPENDENT_AMBULATORY_CARE_PROVIDER_SITE_OTHER): Payer: No Typology Code available for payment source

## 2020-01-23 ENCOUNTER — Other Ambulatory Visit: Payer: Self-pay

## 2020-01-23 DIAGNOSIS — M5441 Lumbago with sciatica, right side: Secondary | ICD-10-CM | POA: Diagnosis not present

## 2020-01-23 DIAGNOSIS — M5442 Lumbago with sciatica, left side: Secondary | ICD-10-CM

## 2020-01-23 DIAGNOSIS — M5416 Radiculopathy, lumbar region: Secondary | ICD-10-CM

## 2020-01-24 ENCOUNTER — Ambulatory Visit: Payer: No Typology Code available for payment source | Admitting: Family Medicine

## 2020-01-24 NOTE — Progress Notes (Signed)
MRI lumbar spine shows small disc protrusion at L5-S1.  This should cause pain going down the back of the leg or to the lateral outside part of the calves but not to the thighs.  We can discuss this in full detail at her upcoming visit on April 7.

## 2020-01-25 ENCOUNTER — Ambulatory Visit (INDEPENDENT_AMBULATORY_CARE_PROVIDER_SITE_OTHER): Payer: No Typology Code available for payment source | Admitting: Physical Therapy

## 2020-01-25 ENCOUNTER — Other Ambulatory Visit: Payer: Self-pay

## 2020-01-25 ENCOUNTER — Encounter: Payer: Self-pay | Admitting: Physical Therapy

## 2020-01-25 DIAGNOSIS — M545 Low back pain, unspecified: Secondary | ICD-10-CM

## 2020-01-25 NOTE — Patient Instructions (Signed)
Access Code: [email or web address] URL: [email or web address] Date: 01/25/2020 Prepared by: Sedalia Muta  Exercises Supine Posterior Pelvic Tilt - 2 x daily - 10 reps - 2 sets Supine Single Knee to Chest - 3 x daily - 3 reps - 30 hold Child's Pose Stretch - 2 x daily - 3 reps - 30 hold Seated Piriformis Stretch with Trunk Bend - 2 x daily - 3 reps - 30 hold Hooklying Isometric Clamshell - 1 x daily - 2 sets - 10 reps Supine Bridge - 1 x daily - 2 sets - 10 reps Supine March - 1 x daily - 2 sets - 10 reps Standing Repeated Hip Abduction with Resistance - 1 x daily - 2 sets - 10 reps Kneeling Plank with Scapular Protraction Retraction AROM - 1 x daily - 2 sets - 10 reps

## 2020-01-26 ENCOUNTER — Ambulatory Visit: Payer: No Typology Code available for payment source | Attending: Internal Medicine

## 2020-01-26 ENCOUNTER — Encounter: Payer: Self-pay | Admitting: Family Medicine

## 2020-01-26 ENCOUNTER — Ambulatory Visit (INDEPENDENT_AMBULATORY_CARE_PROVIDER_SITE_OTHER): Payer: No Typology Code available for payment source | Admitting: Family Medicine

## 2020-01-26 VITALS — BP 110/70 | HR 83 | Ht 64.0 in | Wt 282.6 lb

## 2020-01-26 DIAGNOSIS — Z23 Encounter for immunization: Secondary | ICD-10-CM

## 2020-01-26 DIAGNOSIS — M5441 Lumbago with sciatica, right side: Secondary | ICD-10-CM | POA: Diagnosis not present

## 2020-01-26 DIAGNOSIS — M5442 Lumbago with sciatica, left side: Secondary | ICD-10-CM | POA: Diagnosis not present

## 2020-01-26 NOTE — Patient Instructions (Addendum)
Thank you for coming in today. Please contact [facility] to set up the injection at [phone]. You will need someone to drive you.   If the 1st back injection does not help at all next step is Facet injection in your back. This is a different taget but will seem very similar to you.   Keep me updated.   One option to help manage the now getting be chronic pain is to switch Zoloft to Cymbalta. Talk with your psychiatrist about this switch. 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   Lyrica is also a possibility.   Epidural Steroid Injection Patient Information  Description: The epidural space surrounds the nerves as they exit the spinal cord.  In some patients, the nerves can be compressed and inflamed by a bulging disc or a tight spinal canal (spinal stenosis).  By injecting steroids into the epidural space, we can bring irritated nerves into direct contact with a potentially helpful medication.  These steroids act directly on the irritated nerves and can reduce swelling and inflammation which often leads to decreased pain.  Epidural steroids may be injected anywhere along the spine and from the neck to the low back depending upon the location of your pain.   After numbing the skin with local anesthetic (like Novocaine), a small needle is passed into the epidural space slowly.  You may experience a sensation of pressure while this is being done.  The entire block usually last less than 10 minutes.  Conditions which may be treated by epidural steroids:   Low back and leg pain  Neck and arm pain  Spinal stenosis  Post-laminectomy syndrome  Herpes zoster (shingles) pain  Pain from compression fractures  Preparation for the injection:  1. Do not eat any solid food or dairy products within 8 hours of your appointment.  2. You may drink clear liquids up to 3 hours before appointment.  Clear liquids include water, black coffee, juice or soda.  No milk or cream  please. 3. You may take your regular medication, including pain medications, with a sip of water before your appointment  Diabetics should hold regular insulin (if taken separately) and take 1/2 normal NPH dos the morning of the procedure.  Carry some sugar containing items with you to your appointment. 4. A driver must accompany you and be prepared to drive you home after your procedure.  5. Bring all your current medications with your. 6. An IV may be inserted and sedation may be given at the discretion of the physician.   7. A blood pressure cuff, EKG and other monitors will often be applied during the procedure.  Some patients may need to have extra oxygen administered for a short period. 8. You will be asked to provide medical information, including your allergies, prior to the procedure.  We must know immediately if you are taking blood thinners (like Coumadin/Warfarin)  Or if you are allergic to IV iodine contrast (dye). We must know if you could possible be pregnant.  Possible side-effects:  Bleeding from needle site  Infection (rare, may require surgery)  Nerve injury (rare)  Numbness & tingling (temporary)  Difficulty urinating (rare, temporary)  Spinal headache ( a headache worse with upright posture)  Light -headedness (temporary)  Pain at injection site (several days)  Decreased blood pressure (temporary)  Weakness in arm/leg (temporary)  Pressure sensation in back/neck (temporary)  Call if you experience:  Fever/chills associated with headache or increased  back/neck pain.  Headache worsened by an upright position.  New onset weakness or numbness of an extremity below the injection site  Hives or difficulty breathing (go to the emergency room)  Inflammation or drainage at the infection site  Severe back/neck pain  Any new symptoms which are concerning to you  Please note:  Although the local anesthetic injected can often make your back or neck feel good  for several hours after the injection, the pain will likely return.  It takes 3-7 days for steroids to work in the epidural space.  You may not notice any pain relief for at least that one week.  If effective, we will often do a series of three injections spaced 3-6 weeks apart to maximally decrease your pain.  After the initial series, we generally will wait several months before considering a repeat injection of the same type.

## 2020-01-26 NOTE — Progress Notes (Signed)
   Covid-19 Vaccination Clinic  Name:  Sarah Cardenas    MRN: [id number] DOB: 03-10-1986  01/26/2020  Sarah Cardenas was observed post Covid-19 immunization for 15 minutes without incident. She was provided with Vaccine Information Sheet and instruction to access the V-Safe system.   Sarah Cardenas was instructed to call 911 with any severe reactions post vaccine: Marland Kitchen Difficulty breathing  . Swelling of face and throat  . A fast heartbeat  . A bad rash all over body  . Dizziness and weakness   Immunizations Administered    Name Date Dose VIS Date Route   Pfizer COVID-19 Vaccine 01/26/2020 12:00 PM 0.3 mL 10/01/2019 Intramuscular   Manufacturer: [employer], [employer]   Lot: [id number]   NDC: [address]-4

## 2020-01-26 NOTE — Therapy (Signed)
[facility] [address] [place], [place], [address] Phone: [phone]   Fax:  [phone]  Physical Therapy Treatment  Patient Details  Name: Sarah Cardenas MRN: [id number] Date of Birth: 1985/11/02 Referring Provider (PT): Lynne Leader   Encounter Date: 01/25/2020  PT End of Session - 01/25/20 1432    Visit Number  11    Number of Visits  12    Date for PT Re-Evaluation  01/25/20    Authorization Type  [employer]    PT Start Time  2505    PT Stop Time  1510    PT Time Calculation (min)  45 min    Activity Tolerance  Patient tolerated treatment well    Behavior During Therapy  [facility] for tasks assessed/performed       Past Medical History:  Diagnosis Date  . Anxiety   . Bipolar affect, depressed ([place])   . Depression     Past Surgical History:  Procedure Laterality Date  . BREAST SURGERY     lump removed  . ear drum replacement     right  . FRACTURE SURGERY      There were no vitals filed for this visit.  Subjective Assessment - 01/25/20 1431    Subjective  Pt had MRI, will follow up with MD tomorrow.Is frustrated with continued pain 4/10 , mostly with sitting at work or with prolonged activity.    Patient Stated Goals  decreased pain    Currently in Pain?  Yes    Pain Score  4     Pain Location  Back    Pain Orientation  Right;Left    Pain Descriptors / Indicators  Aching    Pain Type  Acute pain    Pain Onset  More than a month ago    Pain Frequency  Intermittent                       [facility] Adult PT Treatment/Exercise - 01/25/20 1433      Lumbar Exercises: Stretches   Single Knee to Chest Stretch  2 reps;30 seconds    Lower Trunk Rotation  --    Pelvic Tilt  15 reps    Other Lumbar Stretch Exercise  --    Other Lumbar Stretch Exercise  --      Lumbar Exercises: Aerobic   Stationary Bike  L2 x 8 min        Lumbar Exercises: Standing   Row  20 reps    Theraband Level (Row)  Level 3 (Green)    Other Standing Lumbar Exercises    Hip abd 2x10 YTB  bil;      Other Standing Lumbar Exercises  Trunk rotation YTB x15 bil;       Lumbar Exercises: Supine   Clam  20 reps    Clam Limitations  GTB wtih TA, alternating     Bridge  --    Bridge Limitations  painful today    Straight Leg Raise  15 reps    Straight Leg Raises Limitations  bil, with TA    Other Supine Lumbar Exercises  Modified crunch x20;       Lumbar Exercises: Quadruped   Madcat/Old Horse  --    Opposite Arm/Leg Raise Limitations  UE only x20     Plank  high plank on knees 20 sec x 4;        Manual Therapy   Soft tissue mobilization  Pain  at central lumbar/ L5 with palpation and PA mobilization                PT Short Term Goals - 01/04/20 1440      PT SHORT TERM GOAL #1   Title  Pt to be independent with initial HEP    Time  2    Period  Weeks    Status  Achieved    Target Date  12/28/19      PT SHORT TERM GOAL #2   Title  Pt to report decreased pain in back to 0-4/10    Time  2    Period  Weeks    Status  On-going    Target Date  12/28/19        PT Long Term Goals - 01/26/20 0919      PT LONG TERM GOAL #1   Title  Pt to be independent with final HEP    Time  6    Period  Weeks    Status  Partially Met      PT LONG TERM GOAL #2   Title  Pt to report decreased pain in low back to 0-2/10 with activity    Time  6    Period  Weeks    Status  On-going      PT LONG TERM GOAL #3   Title  Pt to demo ability for correct mechanics with bend, squat, lift for safety with low back and functional activity    Time  6    Period  Weeks    Status  Achieved      PT LONG TERM GOAL #4   Title  Pt to demo improved strength of bil Hips and core to at least 4+/5 to improve stability and pain    Time  6    Period  Weeks    Status  Partially Met      PT LONG TERM GOAL #5   Title  Pt to demo ability for full lumbar ROM wtihout pain, to improve ability for ADLS. and IADLs.    Baseline  * Still having  pain with extension    Time  6    Period  Weeks    Status  On-going            Plan - 01/25/20 1456    Clinical Impression Statement  Pt has f/u with MD tomorrow. Pt has had made good progress with ROM , strength, and ability for ther ex. She is still having bothersome pain in central lumbar region. Most pain at L5 with PA mob. Extension still painful today. Discussed weight loss , pt given [facility] . Pt will benefit from continued care to finalize HEP and for pain relief. Will continue based on MD recommendations.    Personal Factors and Comorbidities  Past/Current Experience    Examination-Activity Limitations  Locomotion Level;Bathing;Transfers;Bed Mobility;Bend;Sit;Squat;Stairs;Stand;Lift    Examination-Participation Restrictions  Meal Prep;Cleaning;Community Activity;Shop;Laundry;Yard Work;Driving    Stability/Clinical Decision Making  Stable/Uncomplicated    Rehab Potential  Good    PT Frequency  2x / week    PT Duration  6 weeks    PT Treatment/Interventions  ADLs/Self Care Home Management;Cryotherapy;Electrical Stimulation;Gait training;DME Instruction;Ultrasound;Traction;Moist Heat;Iontophoresis '4mg'$ /ml Dexamethasone;Stair training;Functional mobility training;Therapeutic activities;Therapeutic exercise;Balance training;Neuromuscular re-education;Manual techniques;Patient/family education;Passive range of motion;Orthotic Fit/Training;Dry needling;Taping;Joint Manipulations;Spinal Manipulations    Consulted and Agree with Plan of Care  Patient       Patient will benefit from skilled  therapeutic intervention in order to improve the following deficits and impairments:  Abnormal gait, Decreased range of motion, Difficulty walking, Obesity, Increased muscle spasms, Decreased safety awareness, Decreased activity tolerance, Pain, Improper body mechanics, Impaired flexibility, Decreased mobility, Decreased strength  Visit Diagnosis: Acute bilateral low  back pain, unspecified whether sciatica present     Problem List Patient Active Problem List   Diagnosis Date Noted  . Morbid obesity ([place]) 12/30/2019  . Bipolar 1 disorder, depressed, severe ([place]) 05/26/2019  . Cervical intraepithelial neoplasia grade 1 12/07/2018    Lyndee Hensen, PT, DPT 9:20 AM  01/26/20    [facility] [place], [place], [address] Phone: [phone]   Fax:  [phone]  Name: Sarah Cardenas MRN: [id number] Date of Birth: June 04, 1986

## 2020-01-26 NOTE — Progress Notes (Signed)
I, Christoper Fabian, LAT, ATC, am serving as scribe for Dr. Clementeen Graham.  Sarah Cardenas is a 34 y.o. female who presents to [facility] today for f/u of her low back pain w/ radiating pain into her B ant-lat thighs and L-spine MRI review.  She was last seen by Dr. Denyse Amass on 12/30/19 and had an L-spine and pelvis XR and an MRI was ordered which she had on 01/23/20.  She has completed 10 PT visits.  She has tried a steroid dose pack and is currently taking Baclofen.  Since her last visit, pt reports that she con't to have low back pain but notes that she is no longer having pain running into her legs.  She is taking the Baclofen prn.  Diagnostic imaging: L-spine and pelvis XR- 12/30/19; L-spine MRI- 4/4/21and 03/11/19  Pertinent review of systems: No fevers or chills  Relevant historical information: Bipolar disorder on following medications.  Patient has a follow-up visit with her psychiatrist today. Outpatient Encounter Medications as of 01/26/2020  Medication Sig Note  . albuterol (VENTOLIN HFA) 108 (90 Base) MCG/ACT inhaler Inhale 2 puffs into the lungs every 6 (six) hours as needed for wheezing or shortness of breath.   Marland Kitchen azelastine (ASTELIN) 0.1 % nasal spray Place 2 sprays into both nostrils 2 (two) times daily. Use in each nostril as directed   . baclofen (LIORESAL) 10 MG tablet Take 1 tablet (10 mg total) by mouth 3 (three) times daily as needed for muscle spasms. Do not take with flexeril   . fluticasone (FLONASE) 50 MCG/ACT nasal spray Place 2 sprays into both nostrils daily.   Marland Kitchen ibuprofen (ADVIL,MOTRIN) 200 MG tablet Take 400 mg by mouth every 6 (six) hours as needed.   . lamoTRIgine (LAMICTAL) 150 MG tablet Take 150 mg by mouth 2 (two) times daily. 05/28/2019: Increased to 200mg   . prazosin (MINIPRESS) 2 MG capsule TK 2 CS PO QHS 05/28/2019: Increased to 4mg   . QUEtiapine (SEROQUEL) 25 MG tablet Take 1 tablet (25 mg total) by mouth 2 (two) times daily.   .  sertraline (ZOLOFT) 100 MG tablet TK 1 T PO QAM 05/28/2019: Increased to150mg   . traZODone (DESYREL) 50 MG tablet Take 50 mg by mouth at bedtime as needed for sleep.   . VENTOLIN HFA 108 (90 Base) MCG/ACT inhaler INL 2 PFS ITL Q 4 H FOR UP TO 10 DAYS PRF WHZ OR SOB   . VRAYLAR capsule Take 1.5 mg by mouth every morning.   . [DISCONTINUED] gabapentin (NEURONTIN) 300 MG capsule One tab PO qHS for a week, then BID for a week, then TID. May double weekly to a max of 3,600mg /day    No facility-administered encounter medications on file as of 01/26/2020.      Exam:  BP 110/70 (BP Location: Left Arm, Patient Position: Sitting, Cuff Size: Large)   Pulse 83   Ht 5\' 4"  (1.626 m)   Wt 282 lb 9.6 oz (128.2 kg)   SpO2 98%   BMI 48.51 kg/m  General: Well Developed, well nourished, and in no acute distress.   MSK:  L-spine nontender midline.  Normal lumbar motion.  Lower extremity strength intact.    Lab and Radiology Results No results found for this or any previous visit (from the past 72 hour(s)). MR Lumbar Spine Wo Contrast  Result Date: 01/23/2020 CLINICAL DATA:  Low back pain and intermittent bilateral leg pain. EXAM: MRI LUMBAR SPINE WITHOUT CONTRAST TECHNIQUE: Multiplanar, multisequence MR  imaging of the lumbar spine was performed. No intravenous contrast was administered. COMPARISON:  Lumbar radiographs 12/30/2019 FINDINGS: Segmentation: There are five lumbar type vertebral bodies. The last full intervertebral disc space is labeled L5-S1. Alignment:  Normal Vertebrae:  Normal marrow signal. No bone lesions or fractures. Conus medullaris and cauda equina: Conus extends to the bottom of L1 level. Conus and cauda equina appear normal. Paraspinal and other soft tissues: No significant paraspinal or retroperitoneal findings identified. Disc levels: T12-L1: No significant findings. L1-2: No significant findings. L2-3: No significant findings. L3-4: No significant findings. L4-5: Mild facet disease but  no disc protrusions, spinal or foraminal stenosis. L5-S1: Mild disc desiccation and mild degeneration. There is a small central disc protrusion slightly asymmetric right which contacts the ventral thecal sac and comes close to contacting the right S1 nerve root. No foraminal stenosis. Mild facet disease. IMPRESSION: Small central disc protrusion at L5-S1. Electronically Signed   By: Marijo Sanes M.D.   On: 01/23/2020 10:52   I, Lynne Leader, personally (independently) visualized and performed the interpretation of the images attached in this note.     Assessment and Plan: 34 y.o. female with midline low back pain bilateral.  Fortunately patient does not have much radicular pain now at all.  Most likely examination for pain is the central disc protrusion at L5-S1.  She also has some facet DJD.  Plan to start treatment with interlaminar epidural steroid injection at L5-S1.  Other targets would be facet joints or even SI joints.  Additionally discussed dietary strategies for weight loss which will help as well as nature of chronic back pain.  Also would consider switching from Zoloft to Cymbalta for better pain management or adding Lyrica.  Since patient is seeing her psychiatrist soon would recommend discussing with her psychiatrist the possibility of this change.  Patient will keep me updated following her injections and will check back as needed.  Total encounter time 30 minutes including charting time date of service.    PDMP not reviewed this encounter. Orders Placed This Encounter  Procedures  . DG INJECT DIAG/THERA/INC NEEDLE/CATH/PLC EPI/LUMB/SAC W/IMG    Standing Status:   Future    Standing Expiration Date:   03/27/2021    Order Specific Question:   Reason for Exam (SYMPTOM  OR DIAGNOSIS REQUIRED)    Answer:   interlaminal L5-S1    Order Specific Question:   Is the patient pregnant?    Answer:   No    Order Specific Question:   Preferred Imaging Location?    Answer:   [facility]    Order Specific Question:   Radiology Contrast Protocol - do NOT remove file path    Answer:   [email or web address]   No orders of the defined types were placed in this encounter.    Discussed warning signs or symptoms. Please see discharge instructions. Patient expresses understanding.   The above documentation has been reviewed and is accurate and complete Lynne Leader

## 2020-01-27 ENCOUNTER — Other Ambulatory Visit: Payer: Self-pay

## 2020-01-27 ENCOUNTER — Ambulatory Visit (INDEPENDENT_AMBULATORY_CARE_PROVIDER_SITE_OTHER): Payer: No Typology Code available for payment source | Admitting: Physical Therapy

## 2020-01-27 ENCOUNTER — Encounter: Payer: Self-pay | Admitting: Physical Therapy

## 2020-01-27 DIAGNOSIS — M545 Low back pain, unspecified: Secondary | ICD-10-CM

## 2020-01-27 NOTE — Patient Instructions (Signed)
Access Code: [email or web address] URL: [email or web address] Date: 01/25/2020 Prepared by: Ande Therrell  Exercises Supine Posterior Pelvic Tilt - 2 x daily - 10 reps - 2 sets Supine Single Knee to Chest - 3 x daily - 3 reps - 30 hold Child's Pose Stretch - 2 x daily - 3 reps - 30 hold Seated Piriformis Stretch with Trunk Bend - 2 x daily - 3 reps - 30 hold Hooklying Isometric Clamshell - 1 x daily - 2 sets - 10 reps Supine Bridge - 1 x daily - 2 sets - 10 reps Supine March - 1 x daily - 2 sets - 10 reps Standing Repeated Hip Abduction with Resistance - 1 x daily - 2 sets - 10 reps Kneeling Plank with Scapular Protraction Retraction AROM - 1 x daily - 2 sets - 10 reps   

## 2020-01-28 ENCOUNTER — Encounter: Payer: Self-pay | Admitting: Physical Therapy

## 2020-01-28 NOTE — Therapy (Signed)
[facility] [address] [place], [place], [address] Phone: [phone]   Fax:  [phone]  Physical Therapy Treatment/Discharge   Patient Details  Name: Sarah Cardenas MRN: [id number] Date of Birth: 1986/07/07 Referring Provider (PT): Lynne Leader   Encounter Date: 01/27/2020  PT End of Session - 01/27/20 1453    Visit Number  12    Number of Visits  12    Date for PT Re-Evaluation  01/25/20    Authorization Type  UHC    PT Start Time  4818    PT Stop Time  1513    PT Time Calculation (min)  38 min    Activity Tolerance  Patient tolerated treatment well    Behavior During Therapy  [facility] for tasks assessed/performed       Past Medical History:  Diagnosis Date  . Anxiety   . Bipolar affect, depressed ([place])   . Depression     Past Surgical History:  Procedure Laterality Date  . BREAST SURGERY     lump removed  . ear drum replacement     right  . FRACTURE SURGERY      There were no vitals filed for this visit.  Subjective Assessment - 01/27/20 1450    Subjective  Pt states continued pain. She had MD follow up. She is scheduled for lumbar injection next week.    Currently in Pain?  Yes    Pain Score  4     Pain Location  Back    Pain Orientation  Right;Left    Pain Descriptors / Indicators  Aching    Pain Type  Acute pain    Pain Onset  More than a month ago    Pain Frequency  Intermittent                       [facility] Adult PT Treatment/Exercise - 01/28/20 2117      Lumbar Exercises: Stretches   Single Knee to Chest Stretch  2 reps;30 seconds    Other Lumbar Stretch Exercise  Hip flexor stretch off side of table 30 sec x 2 bil;       Lumbar Exercises: Aerobic   Stationary Bike  L 2 x 8 min;      Lumbar Exercises: Standing   Other Standing Lumbar Exercises  HIp abd x20; March x20;       Lumbar Exercises: Supine   Clam  20 reps    Clam Limitations  GTB wtih TA, alternating     Bridge  Limitations  painful today     Other Supine Lumbar Exercises  Modified crunch x20;       Manual Therapy   Soft tissue mobilization  Pain at central lumbar/ L5 with palpation and PA mobilization              PT Education - 01/27/20 1453    Education Details  Final HEP reviewed , POC, d/c plan discussed    Person(s) Educated  Patient    Methods  Explanation;Demonstration;Verbal cues;Handout    Comprehension  Verbalized understanding;Returned demonstration       PT Short Term Goals - 01/27/20 1454      PT SHORT TERM GOAL #1   Title  Pt to be independent with initial HEP    Time  2    Period  Weeks    Status  Achieved    Target Date  12/28/19      PT SHORT  TERM GOAL #2   Title  Pt to report decreased pain in back to 0-4/10    Time  2    Period  Weeks    Status  Achieved    Target Date  12/28/19        PT Long Term Goals - 01/27/20 1454      PT LONG TERM GOAL #1   Title  Pt to be independent with final HEP    Time  6    Period  Weeks    Status  Achieved      PT LONG TERM GOAL #2   Title  Pt to report decreased pain in low back to 0-2/10 with activity    Time  6    Period  Weeks    Status  On-going      PT LONG TERM GOAL #3   Title  Pt to demo ability for correct mechanics with bend, squat, lift for safety with low back and functional activity    Time  6    Period  Weeks    Status  Achieved      PT LONG TERM GOAL #4   Title  Pt to demo improved strength of bil Hips and core to at least 4+/5 to improve stability and pain    Time  6    Period  Weeks    Status  Achieved      PT LONG TERM GOAL #5   Title  Pt to demo ability for full lumbar ROM wtihout pain, to improve ability for ADLS. and IADLs.    Baseline  * Still having pain with extension    Time  6    Period  Weeks    Status  On-going            Plan - 01/28/20 2119    Clinical Impression Statement  Pt has met most goals. She has improved ROM, and improved strengh of core and hips. She  continues to have pain at L5 region, and pain with extension. She has not had significant pain relief in last couple weeks. Pt scheduled for injection next week. Pt ready for d/c at this time. Discussed importance of continued HEP. Final HEp reviewed in detail today. Pt in agreement with plan.    Personal Factors and Comorbidities  Past/Current Experience    Examination-Activity Limitations  Locomotion Level;Bathing;Transfers;Bed Mobility;Bend;Sit;Squat;Stairs;Stand;Lift    Examination-Participation Restrictions  Meal Prep;Cleaning;Community Activity;Shop;Laundry;Yard Work;Driving    Stability/Clinical Decision Making  Stable/Uncomplicated    Rehab Potential  Good    PT Frequency  2x / week    PT Duration  6 weeks    PT Treatment/Interventions  ADLs/Self Care Home Management;Cryotherapy;Electrical Stimulation;Gait training;DME Instruction;Ultrasound;Traction;Moist Heat;Iontophoresis '4mg'$ /ml Dexamethasone;Stair training;Functional mobility training;Therapeutic activities;Therapeutic exercise;Balance training;Neuromuscular re-education;Manual techniques;Patient/family education;Passive range of motion;Orthotic Fit/Training;Dry needling;Taping;Joint Manipulations;Spinal Manipulations    Consulted and Agree with Plan of Care  Patient       Patient will benefit from skilled therapeutic intervention in order to improve the following deficits and impairments:  Abnormal gait, Decreased range of motion, Difficulty walking, Obesity, Increased muscle spasms, Decreased safety awareness, Decreased activity tolerance, Pain, Improper body mechanics, Impaired flexibility, Decreased mobility, Decreased strength  Visit Diagnosis: Acute bilateral low back pain, unspecified whether sciatica present     Problem List Patient Active Problem List   Diagnosis Date Noted  . Morbid obesity ([place]) 12/30/2019  . Bipolar 1 disorder, depressed, severe ([place]) 05/26/2019  . Cervical intraepithelial neoplasia grade 1  12/07/2018  Lyndee Hensen, PT, DPT 9:21 PM  01/28/20    [facility] [place], [place], [address] Phone: [phone]   Fax:  [phone]  Name: Sarah Cardenas MRN: [id number] Date of Birth: Nov 12, 1985     PHYSICAL THERAPY DISCHARGE SUMMARY  Visits from Start of Care: 12 Plan: Patient agrees to discharge.  Patient goals were partially met. Patient is being discharged due to lack of progress.  ?????       Lyndee Hensen, PT, DPT 9:21 PM  01/28/20

## 2020-02-02 ENCOUNTER — Other Ambulatory Visit: Payer: Self-pay

## 2020-02-02 ENCOUNTER — Ambulatory Visit
Admission: RE | Admit: 2020-02-02 | Discharge: 2020-02-02 | Disposition: A | Discharge status: Home / Self Care | Payer: No Typology Code available for payment source | Source: Ambulatory Visit | Attending: Family Medicine | Admitting: Family Medicine

## 2020-02-02 DIAGNOSIS — M5442 Lumbago with sciatica, left side: Secondary | ICD-10-CM

## 2020-02-02 DIAGNOSIS — M5441 Lumbago with sciatica, right side: Secondary | ICD-10-CM

## 2020-02-02 MED ORDER — IOPAMIDOL (ISOVUE-M 200) INJECTION 41%
1.0000 mL | Freq: Once | INTRAMUSCULAR | Status: AC
  Administered 2020-02-02: 1 mL via EPIDURAL

## 2020-02-02 MED ORDER — METHYLPREDNISOLONE ACETATE 40 MG/ML INJ SUSP (RADIOLOG
120.0000 mg | Freq: Once | INTRAMUSCULAR | Status: AC
  Administered 2020-02-02: 120 mg via EPIDURAL

## 2020-02-02 NOTE — Discharge Instructions (Signed)

## 2020-02-24 ENCOUNTER — Encounter (INDEPENDENT_AMBULATORY_CARE_PROVIDER_SITE_OTHER): Payer: Self-pay | Admitting: Family Medicine

## 2020-02-24 ENCOUNTER — Ambulatory Visit (INDEPENDENT_AMBULATORY_CARE_PROVIDER_SITE_OTHER): Payer: No Typology Code available for payment source | Admitting: Family Medicine

## 2020-02-24 ENCOUNTER — Other Ambulatory Visit: Payer: Self-pay

## 2020-02-24 VITALS — BP 101/70 | HR 79 | Temp 98.0°F | Ht 66.0 in | Wt 273.0 lb

## 2020-02-24 DIAGNOSIS — E538 Deficiency of other specified B group vitamins: Secondary | ICD-10-CM | POA: Diagnosis not present

## 2020-02-24 DIAGNOSIS — F319 Bipolar disorder, unspecified: Secondary | ICD-10-CM

## 2020-02-24 DIAGNOSIS — Z9189 Other specified personal risk factors, not elsewhere classified: Secondary | ICD-10-CM

## 2020-02-24 DIAGNOSIS — F3289 Other specified depressive episodes: Secondary | ICD-10-CM | POA: Diagnosis not present

## 2020-02-24 DIAGNOSIS — R0602 Shortness of breath: Secondary | ICD-10-CM

## 2020-02-24 DIAGNOSIS — M5441 Lumbago with sciatica, right side: Secondary | ICD-10-CM | POA: Diagnosis not present

## 2020-02-24 DIAGNOSIS — R5383 Other fatigue: Secondary | ICD-10-CM | POA: Diagnosis not present

## 2020-02-24 DIAGNOSIS — Z0289 Encounter for other administrative examinations: Secondary | ICD-10-CM

## 2020-02-24 DIAGNOSIS — Z6841 Body Mass Index (BMI) 40.0 and over, adult: Secondary | ICD-10-CM

## 2020-02-24 DIAGNOSIS — G8929 Other chronic pain: Secondary | ICD-10-CM

## 2020-02-24 NOTE — Progress Notes (Signed)
Chief Complaint:   OBESITY Sarah Cardenas (MR# [id number]) is a 34 y.o. female who presents for evaluation and treatment of obesity and related comorbidities. Current BMI is Body mass index is 44.06 kg/m. Sarah Cardenas has been struggling with her weight for many years and has been unsuccessful in either losing weight, maintaining weight loss, or reaching her healthy weight goal.  Sarah Cardenas is currently in the action stage of change and ready to dedicate time achieving and maintaining a healthier weight. Sarah Cardenas is interested in becoming our patient and working on intensive lifestyle modifications including (but not limited to) diet and exercise for weight loss.  Sarah Cardenas says she eats 2-3 meals a day.  She likes overnight oats.  She tends to eat out a lot.  She says she has no hunger at lunch.  She works a sedentary job with increased mental stress.  Sarah Cardenas's habits were reviewed today and are as follows: Her family eats meals together, she thinks her family will eat healthier with her, her desired weight loss is 108 pounds, she started gaining weight within the past 10 years, her heaviest weight ever was 288 pounds, she craves cheese and sweets, she snacks frequently in the evenings, she wakes up frequently in the middle of the night to eat, she skips lunch a couple of times a weeky, she frequently makes poor food choices, she frequently eats larger portions than normal and she struggles with emotional eating.  Depression Screen Sarah Cardenas's Food and Mood (modified PHQ-9) score was 22.  Depression screen PHQ 2/9 02/24/2020  Decreased Interest 3  Down, Depressed, Hopeless 3  PHQ - 2 Score 6  Altered sleeping 2  Tired, decreased energy 3  Change in appetite 3  Feeling bad or failure about yourself  2  Trouble concentrating 3  Moving slowly or fidgety/restless 2  Suicidal thoughts 1  PHQ-9 Score 22  Difficult doing work/chores Extremely dIfficult  Some encounter information is confidential and  restricted. Go to Review Flowsheets activity to see all data.   Subjective:   1. Other fatigue Sarah Cardenas denies daytime somnolence and reports waking up still tired. Patent has a history of symptoms of morning fatigue. Sarah Cardenas generally gets 10 hours of sleep per night, and states that she has generally restful sleep. Snoring is not present. Apneic episodes are not present. Epworth Sleepiness Score is 11.  2. SOB (shortness of breath) on exertion Sarah Cardenas notes increasing shortness of breath with exercising and seems to be worsening over time with weight gain. She notes getting out of breath sooner with activity than she used to. This has gotten worse recently. Sarah Cardenas denies shortness of breath at rest or orthopnea.  3. Chronic bilateral low back pain with right-sided sciatica Status post physical therapy.  She has had recent ESI with Dr. Alvester Morin.  4. B12 deficiency Status post B12 injections with PCP.    Lab Results  Component Value Date   VITAMINB12 163 (L) 12/07/2018   5. Bipolar 1 disorder (HCC) She goes to [facility] for treatment.  She says she sleeps okay with medication.  Sees Patty at [facility] for counseling.  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 She has been working  on behavior modification techniques to help reduce her emotional eating and has been unsuccessful. She shows no sign of suicidal or homicidal ideations.  Assessment/Plan:   1. Other fatigue Sarah Cardenas does feel that her weight is causing her energy to be lower than it should be. Fatigue may be related to obesity, depression or many other causes. Labs will be ordered, and in the meanwhile, Sarah Cardenas will focus on self care including making  healthy food choices, increasing physical activity and focusing on stress reduction.  Orders - EKG 12-Lead - Comprehensive metabolic panel - CBC with Differential/Platelet - Hemoglobin A1c - Insulin, random - VITAMIN D 25 Hydroxy (Vit-D Deficiency, Fractures) - TSH - T4, free - T3 - Anemia panel  2. SOB (shortness of breath) on exertion Sarah Cardenas does feel that she gets out of breath more easily that she used to when she exercises. Sarah Cardenas's shortness of breath appears to be obesity related and exercise induced. She has agreed to work on weight loss and gradually increase exercise to treat her exercise induced shortness of breath. Will continue to monitor closely.  Orders - Lipid panel  3. Chronic bilateral low back pain with right-sided sciatica Will follow because mobility and pain control are important for weight management.  4. B12 deficiency The diagnosis was reviewed with the patient. Counseling provided today, see below. We will continue to monitor. Orders and follow up as documented in patient record.  Counseling . The body needs vitamin B12: to make red blood cells; to make DNA; and to help the nerves work properly so they can carry messages from the brain to the body.  . The main causes of vitamin B12 deficiency include dietary deficiency, digestive diseases, pernicious anemia, and having a surgery in which part of the stomach or small intestine is removed.  . Certain medicines can make it harder for the body to absorb vitamin B12. These medicines include: heartburn medications; some antibiotics; some medications used to treat diabetes, gout, and high cholesterol.  . In some cases, there are no symptoms of this condition. If the condition leads to anemia or nerve damage, various symptoms can occur, such as weakness or fatigue, shortness of breath, and numbness or tingling in your hands and feet.   . Treatment:  o May include taking vitamin B12 supplements.  o Avoid alcohol.   o Eat lots of healthy foods that contain vitamin B12: - Beef, pork, chicken, [place], and organ meats, such as liver.  - Seafood: This includes clams, rainbow trout, salmon, tuna, and haddock. Eggs.  - Cereal and dairy products that are fortified: This means that vitamin B12 has been added to the food.   Orders - Vitamin B12  5. Bipolar 1 disorder (HCC) Followed by Psychiatry for this problem. Current treatment plan is effective, no change in therapy. We will continue to monitor.  6. Other depression, with emotinal eating Behavior modification techniques were discussed today to help Ida deal with her emotional/non-hunger eating behaviors.  Orders and follow up as documented in patient record.   7. At risk for deficient intake of food Sarah Cardenas was given approximately 15 minutes of deficit intake of food prevention counseling today. Hunter is at risk for eating too few calories based on current food recall. She was encouraged to focus on meeting caloric and protein goals according to her recommended meal plan.   8. Class 3 severe obesity with serious comorbidity and body mass index (BMI) of 40.0 to 44.9 in adult, unspecified obesity type (HCC) Sarah Cardenas is  currently in the action stage of change and her goal is to continue with weight loss efforts. I recommend Sarah Cardenas begin the structured treatment plan as follows:  She has agreed to practicing portion control and making smarter food choices, such as increasing vegetables and decreasing simple carbohydrates with Category 1 guidance.  Exercise goals: No exercise has been prescribed at this time.   Behavioral modification strategies: increasing lean protein intake, decreasing simple carbohydrates, increasing vegetables, increasing water intake and decreasing liquid calories.  She was informed of the importance of frequent follow-up visits to maximize her success with intensive lifestyle modifications for her multiple health conditions. She was  informed we would discuss her lab results at her next visit unless there is a critical issue that needs to be addressed sooner. Sarah Cardenas agreed to keep her next visit at the agreed upon time to discuss these results.  Objective:   Blood pressure 101/70, pulse 79, temperature 98 F (36.7 C), temperature source Oral, height 5\' 6"  (1.676 m), weight 273 lb (123.8 kg), last menstrual period 08/22/2019, SpO2 97 %. Body mass index is 44.06 kg/m.  EKG: Normal sinus rhythm, rate 78 bpm.  Indirect Calorimeter completed today shows a VO2 of 144 and a REE of 1003.  Her calculated basal metabolic rate is 13/10/2018 thus her basal metabolic rate is worse than expected.  General: Cooperative, alert, well developed, in no acute distress. HEENT: Conjunctivae and lids unremarkable. Cardiovascular: Regular rhythm.  Lungs: Normal work of breathing. Neurologic: No focal deficits.   Lab Results  Component Value Date   CREATININE 1.16 11/16/2019   BUN 12 11/16/2019   NA 135 11/16/2019   K 4.6 11/16/2019   CL 99 11/16/2019   CO2 27 11/16/2019   Lab Results  Component Value Date   ALT 15 11/16/2019   AST 18 11/16/2019   ALKPHOS 84 11/16/2019   BILITOT 0.3 11/16/2019   Lab Results  Component Value Date   TSH 2.24 12/07/2018   Lab Results  Component Value Date   CHOL 226 (H) 12/07/2018   HDL 52.60 12/07/2018   LDLDIRECT 161.0 12/07/2018   TRIG 229.0 (H) 12/07/2018   CHOLHDL 4 12/07/2018   Lab Results  Component Value Date   WBC 10.2 11/16/2019   HGB 12.6 11/16/2019   HCT 38.9 11/16/2019   MCV 80.8 11/16/2019   PLT 277.0 11/16/2019   Attestation Statements:   This is the patient's first visit at [facility]. The patient's NEW PATIENT PACKET was reviewed at length. Included in the packet: current and past health history, medications, allergies, ROS, gynecologic history (women only), surgical history, family history, social history, weight history, weight loss surgery history (for  those that have had weight loss surgery), nutritional evaluation, mood and food questionnaire, PHQ9, Epworth questionnaire, sleep habits questionnaire, patient life and health improvement goals questionnaire. These will all be scanned into the patient's chart under media.   During the visit, I independently reviewed the patient's EKG, bioimpedance scale results, and indirect calorimeter results. I used this information to tailor a meal plan for the patient that will help her to lose weight and will improve her obesity-related conditions going forward. I performed a medically necessary appropriate examination and/or evaluation. I discussed the assessment and treatment plan with the patient. The patient was provided an opportunity to ask questions and all were answered. The patient agreed with the plan and demonstrated an understanding of the instructions. Labs were ordered at this visit and will be reviewed at the  next visit unless more critical results need to be addressed immediately. Clinical information was updated and documented in the EMR.   I, Water quality scientist, CMA, am acting as Location manager for [employer], DO.  I have reviewed the above documentation for accuracy and completeness, and I agree with the above. Briscoe Deutscher, DO

## 2020-02-25 LAB — COMPREHENSIVE METABOLIC PANEL
ALT: 14 IU/L (ref 0–32)
AST: 15 IU/L (ref 0–40)
Albumin/Globulin Ratio: 1.8 (ref 1.2–2.2)
Albumin: 4.3 g/dL (ref 3.8–4.8)
Alkaline Phosphatase: 107 IU/L (ref 39–117)
BUN/Creatinine Ratio: 15 (ref 9–23)
BUN: 15 mg/dL (ref 6–20)
Bilirubin Total: 0.3 mg/dL (ref 0.0–1.2)
CO2: 20 mmol/L (ref 20–29)
Calcium: 9.6 mg/dL (ref 8.7–10.2)
Chloride: 105 mmol/L (ref 96–106)
Creatinine, Ser: 1.01 mg/dL — ABNORMAL HIGH (ref 0.57–1.00)
GFR calc Af Amer: 84 mL/min/{1.73_m2} (ref >59)
GFR calc non Af Amer: 73 mL/min/{1.73_m2} (ref >59)
Globulin, Total: 2.4 g/dL (ref 1.5–4.5)
Glucose: 81 mg/dL (ref 65–99)
Potassium: 4.6 mmol/L (ref 3.5–5.2)
Sodium: 140 mmol/L (ref 134–144)
Total Protein: 6.7 g/dL (ref 6.0–8.5)

## 2020-02-25 LAB — ANEMIA PANEL
Ferritin: 13 ng/mL — ABNORMAL LOW (ref 15–150)
Folate, Hemolysate: 356 ng/mL
Folate, RBC: 858 ng/mL (ref >498)
Hematocrit: 41.5 % (ref 34.0–46.6)
Iron Saturation: 23 % (ref 15–55)
Iron: 83 ug/dL (ref 27–159)
Retic Ct Pct: 1.4 % (ref 0.6–2.6)
Total Iron Binding Capacity: 358 ug/dL (ref 250–450)
UIBC: 275 ug/dL (ref 131–425)
Vitamin B-12: 354 pg/mL (ref 232–1245)

## 2020-02-25 LAB — CBC WITH DIFFERENTIAL/PLATELET
Basophils Absolute: 0.1 10*3/uL (ref 0.0–0.2)
Basos: 1 %
EOS (ABSOLUTE): 0.3 10*3/uL (ref 0.0–0.4)
Eos: 3 %
Hemoglobin: 13.6 g/dL (ref 11.1–15.9)
Immature Grans (Abs): 0 10*3/uL (ref 0.0–0.1)
Immature Granulocytes: 0 %
Lymphocytes Absolute: 3.3 10*3/uL — ABNORMAL HIGH (ref 0.7–3.1)
Lymphs: 35 %
MCH: 27.4 pg (ref 26.6–33.0)
MCHC: 32.8 g/dL (ref 31.5–35.7)
MCV: 84 fL (ref 79–97)
Monocytes Absolute: 0.5 10*3/uL (ref 0.1–0.9)
Monocytes: 6 %
Neutrophils Absolute: 5.2 10*3/uL (ref 1.4–7.0)
Neutrophils: 55 %
Platelets: 270 10*3/uL (ref 150–450)
RBC: 4.97 x10E6/uL (ref 3.77–5.28)
RDW: 14.7 % (ref 11.7–15.4)
WBC: 9.3 10*3/uL (ref 3.4–10.8)

## 2020-02-25 LAB — HEMOGLOBIN A1C
Est. average glucose Bld gHb Est-mCnc: 103 mg/dL
Hgb A1c MFr Bld: 5.2 % (ref 4.8–5.6)

## 2020-02-25 LAB — LIPID PANEL
Chol/HDL Ratio: 4.4 ratio (ref 0.0–4.4)
Cholesterol, Total: 239 mg/dL — ABNORMAL HIGH (ref 100–199)
HDL: 54 mg/dL (ref >39)
LDL Chol Calc (NIH): 168 mg/dL — ABNORMAL HIGH (ref 0–99)
Triglycerides: 94 mg/dL (ref 0–149)
VLDL Cholesterol Cal: 17 mg/dL (ref 5–40)

## 2020-02-25 LAB — T4, FREE: Free T4: 1.04 ng/dL (ref 0.82–1.77)

## 2020-02-25 LAB — INSULIN, RANDOM: INSULIN: 12 u[IU]/mL (ref 2.6–24.9)

## 2020-02-25 LAB — T3: T3, Total: 84 ng/dL (ref 71–180)

## 2020-02-25 LAB — TSH: TSH: 2.35 u[IU]/mL (ref 0.450–4.500)

## 2020-02-25 LAB — VITAMIN D 25 HYDROXY (VIT D DEFICIENCY, FRACTURES): Vit D, 25-Hydroxy: 25.7 ng/mL — ABNORMAL LOW (ref 30.0–100.0)

## 2020-03-06 ENCOUNTER — Other Ambulatory Visit: Payer: Self-pay

## 2020-03-06 ENCOUNTER — Encounter: Payer: Self-pay | Admitting: Physician Assistant

## 2020-03-06 ENCOUNTER — Telehealth (INDEPENDENT_AMBULATORY_CARE_PROVIDER_SITE_OTHER): Payer: No Typology Code available for payment source | Admitting: Physician Assistant

## 2020-03-06 VITALS — Ht 66.0 in | Wt 278.0 lb

## 2020-03-06 DIAGNOSIS — J029 Acute pharyngitis, unspecified: Secondary | ICD-10-CM

## 2020-03-06 MED ORDER — AMOXICILLIN 875 MG PO TABS
875.0000 mg | ORAL_TABLET | Freq: Two times a day (BID) | ORAL | 0 refills | Status: DC
Start: 2020-03-06 — End: 2020-03-29

## 2020-03-06 NOTE — Progress Notes (Signed)
Virtual Visit via Video   I connected with Sarah Cardenas on 03/06/20 at 11:30 AM EDT by a video enabled telemedicine application and verified that I am speaking with the correct person using two identifiers. Location patient: Home Location provider: [facility], Office Persons participating in the virtual visit: Sapphira Cina Klumpp, [name] PA-C, Corky Mull, LPN   I discussed the limitations of evaluation and management by telemedicine and the availability of in person appointments. The patient expressed understanding and agreed to proceed.  I acted as a Neurosurgeon for [employer], PA-C [employer], LPN   Subjective:   HPI:   Patient is requesting evaluation for URI symptoms.  Symptom onset: two days ago  She is fully COVID-19 vaccinated as of April.  Travel/contacts: No   Patient endorses the following symptoms: subjective fever, sinus headache, sinus congestion, rhinorrhea, ear pain, sore throat, difficulty swallowing, dry cough (non-productive) and myalgia  Worst symptom currently is sore throat, worsening with time.  Patient denies the following symptoms: ear drainage, wheezing, shortness of breath, chest tightness and chest pain  Treatments tried: Advil cold and sinus   Patient risk factors: Current COVID-19 risk of complications score: 1 Smoking status: Rajanae Anselm Pancoast  reports that she has been smoking cigarettes. She has a 5.00 pack-year smoking history. She has never used smokeless tobacco. If female, currently pregnant? []   Yes [x]   No  ROS: See pertinent positives and negatives per HPI.  Patient Active Problem List   Diagnosis Date Noted  . Morbid obesity (HCC) 12/30/2019  . Bipolar 1 disorder, depressed, severe (HCC) 05/26/2019  . Cervical intraepithelial neoplasia grade 1 12/07/2018    Social History   Tobacco Use  . Smoking status: Current Some Day Smoker    Packs/day: 0.50    Years: 10.00    Pack years: 5.00    Types:  Cigarettes  . Smokeless tobacco: Never Used  Substance Use Topics  . Alcohol use: Yes    Comment: occ    Current Outpatient Medications:  .  albuterol (VENTOLIN HFA) 108 (90 Base) MCG/ACT inhaler, Inhale 2 puffs into the lungs every 6 (six) hours as needed for wheezing or shortness of breath., Disp: 8 g, Rfl: 0 .  azelastine (ASTELIN) 0.1 % nasal spray, Place 2 sprays into both nostrils 2 (two) times daily. Use in each nostril as directed, Disp: 30 mL, Rfl: 12 .  baclofen (LIORESAL) 10 MG tablet, Take 1 tablet (10 mg total) by mouth 3 (three) times daily as needed for muscle spasms. Do not take with flexeril, Disp: 30 each, Rfl: 1 .  hydrOXYzine (ATARAX/VISTARIL) 25 MG tablet, hydroxyzine HCl 25 mg tablet  TAKE 1/2 TO 1 TABLET BY MOUTH THREE TIMES DAILY AS NEEDED FOR ANXIETY OR PANIC OR AGITATION OR SLEEP, Disp: , Rfl:  .  ibuprofen (ADVIL,MOTRIN) 200 MG tablet, Take 400 mg by mouth every 6 (six) hours as needed., Disp: , Rfl:  .  lamoTRIgine (LAMICTAL) 200 MG tablet, Take 200 mg by mouth 2 (two) times daily., Disp: , Rfl:  .  prazosin (MINIPRESS) 2 MG capsule, TK 2 CS PO QHS, Disp: , Rfl:  .  sertraline (ZOLOFT) 100 MG tablet, TK 1 T PO QAM, Disp: , Rfl:  .  traZODone (DESYREL) 100 MG tablet, Take 100 mg by mouth at bedtime., Disp: , Rfl:  .  VENTOLIN HFA 108 (90 Base) MCG/ACT inhaler, INL 2 PFS ITL Q 4 H FOR UP TO 10 DAYS PRF WHZ OR SOB, Disp: ,  Rfl:  .  VRAYLAR capsule, Take 1.5 mg by mouth every morning., Disp: , Rfl:  .  amoxicillin (AMOXIL) 875 MG tablet, Take 1 tablet (875 mg total) by mouth 2 (two) times daily., Disp: 20 tablet, Rfl: 0  Allergies  Allergen Reactions  . Ciprofloxacin Other (See Comments)    numbness    Objective:   VITALS: Per patient if applicable, see vitals. GENERAL: Alert, appears well and in no acute distress. HEENT: Atraumatic, conjunctiva clear, no obvious abnormalities on inspection of external nose and ears. NECK: Normal movements of the head and  neck. CARDIOPULMONARY: No increased WOB. Speaking in clear sentences. I:E ratio WNL.  MS: Moves all visible extremities without noticeable abnormality. PSYCH: Pleasant and cooperative, well-groomed. Speech normal rate and rhythm. Affect is appropriate. Insight and judgement are appropriate. Attention is focused, linear, and appropriate.  NEURO: CN grossly intact. Oriented as arrived to appointment on time with no prompting. Moves both UE equally.  SKIN: No obvious lesions, wounds, erythema, or cyanosis noted on face or hands.  Assessment and Plan:   Tashika was seen today for covid symptoms.  Diagnoses and all orders for this visit:  Pharyngitis, unspecified etiology  Other orders -     amoxicillin (AMOXIL) 875 MG tablet; Take 1 tablet (875 mg total) by mouth 2 (two) times daily.    Patient has a respiratory illness without signs of acute distress or respiratory compromise at this time. This is likely a viral infection, which can come from a number of respiratory viruses, however she does have history of strep and current strep symptoms consistent with past infections. Will empirically treat with oral amoxicillin given symptoms and inablity to test in office currently.  Worsening precautions advised.  I also did discuss with patient that although very unlikely given vaccinated status, I cannot rule out COVID-19 given her symptoms.  . Reviewed expectations re: course of current medical issues. . Discussed self-management of symptoms. . Outlined signs and symptoms indicating need for more acute intervention. . Patient verbalized understanding and all questions were answered. Marland Kitchen Health Maintenance issues including appropriate healthy diet, exercise, and smoking avoidance were discussed with patient. . See orders for this visit as documented in the electronic medical record.  I discussed the assessment and treatment plan with the patient. The patient was provided an opportunity to ask  questions and all were answered. The patient agreed with the plan and demonstrated an understanding of the instructions.   The patient was advised to call back or seek an in-person evaluation if the symptoms worsen or if the condition fails to improve as anticipated.   CMA or LPN served as scribe during this visit. History, Physical, and Plan performed by medical provider. The above documentation has been reviewed and is accurate and complete.   [place], [place] 03/06/2020

## 2020-03-09 ENCOUNTER — Encounter (INDEPENDENT_AMBULATORY_CARE_PROVIDER_SITE_OTHER): Payer: Self-pay | Admitting: Family Medicine

## 2020-03-09 ENCOUNTER — Other Ambulatory Visit: Payer: Self-pay

## 2020-03-09 ENCOUNTER — Ambulatory Visit (INDEPENDENT_AMBULATORY_CARE_PROVIDER_SITE_OTHER): Payer: No Typology Code available for payment source | Admitting: Family Medicine

## 2020-03-09 VITALS — BP 111/74 | HR 78 | Temp 97.8°F | Ht 66.0 in | Wt 276.0 lb

## 2020-03-09 DIAGNOSIS — E8881 Metabolic syndrome: Secondary | ICD-10-CM

## 2020-03-09 DIAGNOSIS — F319 Bipolar disorder, unspecified: Secondary | ICD-10-CM

## 2020-03-09 DIAGNOSIS — Z9189 Other specified personal risk factors, not elsewhere classified: Secondary | ICD-10-CM

## 2020-03-09 DIAGNOSIS — E7849 Other hyperlipidemia: Secondary | ICD-10-CM | POA: Diagnosis not present

## 2020-03-09 DIAGNOSIS — Z6841 Body Mass Index (BMI) 40.0 and over, adult: Secondary | ICD-10-CM

## 2020-03-09 DIAGNOSIS — E538 Deficiency of other specified B group vitamins: Secondary | ICD-10-CM | POA: Diagnosis not present

## 2020-03-09 DIAGNOSIS — E559 Vitamin D deficiency, unspecified: Secondary | ICD-10-CM | POA: Diagnosis not present

## 2020-03-09 DIAGNOSIS — R790 Abnormal level of blood mineral: Secondary | ICD-10-CM | POA: Diagnosis not present

## 2020-03-09 MED ORDER — VITAMIN D (ERGOCALCIFEROL) 1.25 MG (50000 UNIT) PO CAPS: 50000.0000 [IU] | ORAL_CAPSULE | ORAL | 0 refills | Status: DC

## 2020-03-09 NOTE — Progress Notes (Signed)
Chief Complaint:   OBESITY Sarah Cardenas is here to discuss her progress with her obesity treatment plan along with follow-up of her obesity related diagnoses. Sarah Cardenas is on the Category 1 Plan and states she is following her eating plan approximately 25% of the time. Sarah Cardenas states she is walking for 30 minutes 2-3 times per week.  Today's visit was #: 2 Starting weight: 273 lbs Starting date: 02/24/2020 Today's weight: 276 lbs Today's date: 03/09/2020 Total lbs lost to date: 0 Total lbs lost since last in-office visit: 0  Interim History: Sarah Cardenas has recently had a birthday and attended a wedding.  She reports she also received a skin cancer diagnosis.  She says she struggles with dinner.  Breakfast is okay.  She has been skipping lunch sometimes.  She wants [place] food; it is her favorite.  Subjective:   1. Vitamin D deficiency Sarah Cardenas's Vitamin D level was 25.7 on 02/24/2020. She is currently taking no vitamin D supplement. She denies nausea, vomiting or muscle weakness.  2. B12 deficiency She is not a vegetarian.  She does not have a previous diagnosis of pernicious anemia.  She does not have a history of weight loss surgery.   Lab Results  Component Value Date   VITAMINB12 354 02/24/2020   3. Low ferritin level  Lab Results  Component Value Date   FERRITIN 13 (L) 02/24/2020   4. Other hyperlipidemia Sarah Cardenas has hyperlipidemia and has been trying to improve her cholesterol levels with intensive lifestyle modification including a low saturated fat diet, exercise and weight loss. She denies any chest pain, claudication or myalgias.  Lab Results  Component Value Date   ALT 14 02/24/2020   AST 15 02/24/2020   ALKPHOS 107 02/24/2020   BILITOT 0.3 02/24/2020   Lab Results  Component Value Date   CHOL 239 (H) 02/24/2020   HDL 54 02/24/2020   LDLCALC 168 (H) 02/24/2020   LDLDIRECT 161.0 12/07/2018   TRIG 94 02/24/2020   CHOLHDL 4.4 02/24/2020   5. Bipolar 1 disorder (HCC) Sarah Cardenas is  taking Zoloft and Lamictal for bipolar 1.  6. Insulin resistance Sarah Cardenas has a diagnosis of insulin resistance based on her elevated fasting insulin level >5. She continues to work on diet and exercise to decrease her risk of diabetes.  Lab Results  Component Value Date   INSULIN 12.0 02/24/2020   Lab Results  Component Value Date   HGBA1C 5.2 02/24/2020   7. At risk for constipation Sarah Cardenas is at increased risk for constipation due to inadequate water intake, changes in diet, and/or use of medications such as GLP1 agonists. Sarah Cardenas denies hard, infrequent stools currently.   Assessment/Plan:   1. Vitamin D deficiency Low Vitamin D level contributes to fatigue and are associated with obesity, breast, and colon cancer. She agrees to start to take prescription Vitamin D @50 ,000 IU every week and will follow-up for routine testing of Vitamin D, at least 2-3 times per year to avoid over-replacement.  Orders - Vitamin D, Ergocalciferol, (DRISDOL) 1.25 MG (50000 UNIT) CAPS capsule; Take 1 capsule (50,000 Units total) by mouth every 7 (seven) days.  Dispense: 4 capsule; Refill: 0  2. B12 deficiency The diagnosis was reviewed with the patient. Counseling provided today, see below. We will continue to monitor. Orders and follow up as documented in patient record.  Counseling . The body needs vitamin B12: to make red blood cells; to make DNA; and to help the nerves work properly so they can carry messages  from the brain to the body.  . The main causes of vitamin B12 deficiency include dietary deficiency, digestive diseases, pernicious anemia, and having a surgery in which part of the stomach or small intestine is removed.  . Certain medicines can make it harder for the body to absorb vitamin B12. These medicines include: heartburn medications; some antibiotics; some medications used to treat diabetes, gout, and high cholesterol.  . In some cases, there are no symptoms of this condition. If the  condition leads to anemia or nerve damage, various symptoms can occur, such as weakness or fatigue, shortness of breath, and numbness or tingling in your hands and feet.   . Treatment:  o May include taking vitamin B12 supplements.  o Avoid alcohol.  o Eat lots of healthy foods that contain vitamin B12: - Beef, pork, chicken, [place], and organ meats, such as liver.  - Seafood: This includes clams, rainbow trout, salmon, tuna, and haddock. Eggs.  - Cereal and dairy products that are fortified: This means that vitamin B12 has been added to the food.   3. Low ferritin level Sarah Cardenas will start taking OTC slow-release iron.  Orders and follow up as documented in patient record.  Counseling . Iron is essential for our bodies to make red blood cells.  Reasons that someone may be deficient include: an iron-deficient diet (more likely in those following vegan or vegetarian diets), women with heavy menses, patients with GI disorders or poor absorption, patients that have had bariatric surgery, frequent blood donors, patients with cancer, and patients with heart disease.   Sarah Cardenas foods include dark leafy greens, red and white meats, eggs, seafood, and beans.   . Certain foods and drinks prevent your body from absorbing iron properly. Avoid eating these foods in the same meal as iron-rich foods or with iron supplements. These foods include: coffee, black tea, and red wine; milk, dairy products, and foods that are high in calcium; beans and soybeans; whole grains.  . Constipation can be a side effect of iron supplementation. Increased water and fiber intake are helpful. Water goal: > 2 liters/day. Fiber goal: > 25 grams/day.  4. Other hyperlipidemia Cardiovascular risk and specific lipid/LDL goals reviewed.  We discussed several lifestyle modifications today and Sarah Cardenas will continue to work on diet, exercise and weight loss efforts. Orders and follow up as documented in patient record.    Counseling Intensive lifestyle modifications are the first line treatment for this issue. . Dietary changes: Increase soluble fiber. Decrease simple carbohydrates. . Exercise changes: Moderate to vigorous-intensity aerobic activity 150 minutes per week if tolerated. . Lipid-lowering medications: see documented in medical record.  5. Bipolar 1 disorder ([place]) She will follow-up with her psychiatrist for continued treatment..  6. Insulin resistance Sarah Cardenas will continue to work on weight loss, exercise, and decreasing simple carbohydrates to help decrease the risk of diabetes. Sarah Cardenas agreed to follow-up with [place] as directed to closely monitor her progress.  She will continue metformin or Saxenda going forward.  7. At risk for constipation Sarah Cardenas was given approximately 15 minutes of counseling today regarding prevention of constipation. She was encouraged to increase water and fiber intake.   8. Class 3 severe obesity with serious comorbidity and body mass index (BMI) of 40.0 to 44.9 in adult, unspecified obesity type ([place]) Sarah Cardenas is currently in the action stage of change. As such, her goal is to continue with weight loss efforts. She has agreed to the Category 1 Plan.   Exercise goals: No exercise  has been prescribed at this time.  Behavioral modification strategies: increasing lean protein intake and increasing water intake.  Sarah Cardenas has agreed to follow-up with our clinic in 2 weeks. She was informed of the importance of frequent follow-up visits to maximize her success with intensive lifestyle modifications for her multiple health conditions.   Objective:   Blood pressure 111/74, pulse 78, temperature 97.8 F (36.6 C), temperature source Oral, height 5\' 6"  (1.676 m), weight 276 lb (125.2 kg), SpO2 97 %. Body mass index is 44.55 kg/m.  General: Cooperative, alert, well developed, in no acute distress. HEENT: Conjunctivae and lids unremarkable. Cardiovascular: Regular rhythm.  Lungs:  Normal work of breathing. Neurologic: No focal deficits.   Lab Results  Component Value Date   CREATININE 1.01 (H) 02/24/2020   BUN 15 02/24/2020   NA 140 02/24/2020   K 4.6 02/24/2020   CL 105 02/24/2020   CO2 20 02/24/2020   Lab Results  Component Value Date   ALT 14 02/24/2020   AST 15 02/24/2020   ALKPHOS 107 02/24/2020   BILITOT 0.3 02/24/2020   Lab Results  Component Value Date   HGBA1C 5.2 02/24/2020   Lab Results  Component Value Date   INSULIN 12.0 02/24/2020   Lab Results  Component Value Date   TSH 2.350 02/24/2020   Lab Results  Component Value Date   CHOL 239 (H) 02/24/2020   HDL 54 02/24/2020   LDLCALC 168 (H) 02/24/2020   LDLDIRECT 161.0 12/07/2018   TRIG 94 02/24/2020   CHOLHDL 4.4 02/24/2020   Lab Results  Component Value Date   WBC 9.3 02/24/2020   HGB 13.6 02/24/2020   HCT 41.5 02/24/2020   MCV 84 02/24/2020   PLT 270 02/24/2020   Lab Results  Component Value Date   IRON 83 02/24/2020   TIBC 358 02/24/2020   FERRITIN 13 (L) 02/24/2020   Attestation Statements:   Reviewed by clinician on day of visit: allergies, medications, problem list, medical history, surgical history, family history, social history, and previous encounter notes.  I, 04/25/2020, CMA, am acting as Insurance claims handler for Energy manager, DO.  I have reviewed the above documentation for accuracy and completeness, and I agree with the above. [employer], DO

## 2020-03-16 ENCOUNTER — Telehealth: Payer: Self-pay | Admitting: Physician Assistant

## 2020-03-16 NOTE — Telephone Encounter (Signed)
Patient spoke with [facility] on 03/16/20 at 2:35pm:  Patient called stating that she was seen 5/17 for sore throat and stuffy nose.  States she now has cough and wheezing.  No fever or SOB.  Lovena Le RN spoke with patient.   COVID 19 diagnosed or suspected.  Mild difficulty breathing.  Was informed to go to ED now.  Patient understood and stated they would comply.

## 2020-03-17 NOTE — Telephone Encounter (Signed)
Noted  

## 2020-03-22 ENCOUNTER — Encounter: Payer: Self-pay | Admitting: Physician Assistant

## 2020-03-22 ENCOUNTER — Telehealth (INDEPENDENT_AMBULATORY_CARE_PROVIDER_SITE_OTHER): Payer: No Typology Code available for payment source | Admitting: Physician Assistant

## 2020-03-22 ENCOUNTER — Other Ambulatory Visit: Payer: Self-pay

## 2020-03-22 VITALS — Ht 66.0 in | Wt 276.0 lb

## 2020-03-22 DIAGNOSIS — J189 Pneumonia, unspecified organism: Secondary | ICD-10-CM | POA: Diagnosis not present

## 2020-03-22 MED ORDER — AZITHROMYCIN 250 MG PO TABS
ORAL_TABLET | ORAL | 0 refills | Status: DC
Start: 2020-03-22 — End: 2020-03-29

## 2020-03-22 MED ORDER — FLUTICASONE PROPIONATE 50 MCG/ACT NA SUSP: 2.0000 | Freq: Every day | NASAL | 2 refills | Status: DC

## 2020-03-22 MED ORDER — BUDESONIDE-FORMOTEROL FUMARATE 80-4.5 MCG/ACT IN AERO
2.0000 | INHALATION_SPRAY | Freq: Two times a day (BID) | RESPIRATORY_TRACT | 1 refills | Status: DC
Start: 2020-03-22 — End: 2021-01-10

## 2020-03-22 NOTE — Progress Notes (Signed)
Virtual Visit via Video   I connected with Sarah Cardenas on 03/22/20 at  4:00 PM EDT by a video enabled telemedicine application and verified that I am speaking with the correct person using two identifiers. Location patient: Home Location provider: [facility], Office Persons participating in the virtual visit: Sarah Cardenas, [name] PA-C, Sarah Mull, LPN   I discussed the limitations of evaluation and management by telemedicine and the availability of in person appointments. The patient expressed understanding and agreed to proceed.  I acted as a Neurosurgeon for Sarah Cardenas, Sarah [employer], LPN   Subjective:   HPI:   Pneumonia Pt c/o still not feeling better. Pt was seen at [facility] on 5/27 and diagnosed with Pneumonia, prescribed Prednisone and Doxycycline. She had a negative chest xray.  Since that time she has had ongoing coughing, wheezing and sensation of her L ear feeling clogged. Denies fevers, chills, n/v. Able to hydrate well and eating normal foods. Completed prednisone course today.   ROS: See pertinent positives and negatives per HPI.  Patient Active Problem List   Diagnosis Date Noted  . Morbid obesity (HCC) 12/30/2019  . Bipolar 1 disorder, depressed, severe (HCC) 05/26/2019  . Cervical intraepithelial neoplasia grade 1 12/07/2018    Social History   Tobacco Use  . Smoking status: Current Some Day Smoker    Packs/day: 0.50    Years: 10.00    Pack years: 5.00    Types: Cigarettes  . Smokeless tobacco: Never Used  Substance Use Topics  . Alcohol use: Yes    Comment: occ    Current Outpatient Medications:  .  albuterol (VENTOLIN HFA) 108 (90 Base) MCG/ACT inhaler, Inhale 2 puffs into the lungs every 6 (six) hours as needed for wheezing or shortness of breath., Disp: 8 g, Rfl: 0 .  baclofen (LIORESAL) 10 MG tablet, Take 1 tablet (10 mg total) by mouth 3 (three) times daily as needed for muscle spasms. Do not take with flexeril, Disp:  30 each, Rfl: 1 .  hydrOXYzine (ATARAX/VISTARIL) 25 MG tablet, hydroxyzine HCl 25 mg tablet  TAKE 1/2 TO 1 TABLET BY MOUTH THREE TIMES DAILY AS NEEDED FOR ANXIETY OR PANIC OR AGITATION OR SLEEP, Disp: , Rfl:  .  ibuprofen (ADVIL,MOTRIN) 200 MG tablet, Take 400 mg by mouth every 6 (six) hours as needed., Disp: , Rfl:  .  lamoTRIgine (LAMICTAL) 200 MG tablet, Take 200 mg by mouth 2 (two) times daily., Disp: , Rfl:  .  prazosin (MINIPRESS) 2 MG capsule, TK 2 CS PO QHS, Disp: , Rfl:  .  sertraline (ZOLOFT) 100 MG tablet, TK 1 T PO QAM, Disp: , Rfl:  .  traZODone (DESYREL) 100 MG tablet, Take 100 mg by mouth at bedtime., Disp: , Rfl:  .  VENTOLIN HFA 108 (90 Base) MCG/ACT inhaler, INL 2 PFS ITL Q 4 H FOR UP TO 10 DAYS PRF WHZ OR SOB, Disp: , Rfl:  .  Vitamin D, Ergocalciferol, (DRISDOL) 1.25 MG (50000 UNIT) CAPS capsule, Take 1 capsule (50,000 Units total) by mouth every 7 (seven) days., Disp: 4 capsule, Rfl: 0 .  VRAYLAR capsule, Take 1.5 mg by mouth every morning., Disp: , Rfl:  .  amoxicillin (AMOXIL) 875 MG tablet, Take 1 tablet (875 mg total) by mouth 2 (two) times daily. (Patient not taking: Reported on 03/22/2020), Disp: 20 tablet, Rfl: 0 .  azelastine (ASTELIN) 0.1 % nasal spray, Place 2 sprays into both nostrils 2 (two) times daily. Use in  each nostril as directed (Patient not taking: Reported on 03/22/2020), Disp: 30 mL, Rfl: 12 .  azithromycin (ZITHROMAX) 250 MG tablet, Take two tablets on day 1, then one daily x 4 days, Disp: 6 tablet, Rfl: 0 .  budesonide-formoterol (SYMBICORT) 80-4.5 MCG/ACT inhaler, Inhale 2 puffs into the lungs 2 (two) times daily., Disp: 1 Inhaler, Rfl: 1 .  fluticasone (FLONASE) 50 MCG/ACT nasal spray, Place 2 sprays into both nostrils daily., Disp: 16 g, Rfl: 2  Allergies  Allergen Reactions  . Ciprofloxacin Other (See Comments)    numbness    Objective:   VITALS: Per patient if applicable, see vitals. GENERAL: Alert, appears well and in no acute distress. HEENT:  Atraumatic, conjunctiva clear, no obvious abnormalities on inspection of external nose and ears. NECK: Normal movements of the head and neck. CARDIOPULMONARY: No increased WOB. Speaking in clear sentences. I:E ratio WNL.  MS: Moves all visible extremities without noticeable abnormality. PSYCH: Pleasant and cooperative, well-groomed. Speech normal rate and rhythm. Affect is appropriate. Insight and judgement are appropriate. Attention is focused, linear, and appropriate.  NEURO: CN grossly intact. Oriented as arrived to appointment on time with no prompting. Moves both UE equally.  SKIN: No obvious lesions, wounds, erythema, or cyanosis noted on face or hands.  Assessment and Plan:   Sarah Cardenas was seen today for pneumonia.  Diagnoses and all orders for this visit:  Pneumonia due to infectious organism, unspecified laterality, unspecified part of lung Symptoms have not improved. She is current smoker. Will add on azithromycin and symbicort. No obvious signs of distress. Will also treat likely ETD with flonase nasal spray. She is fully COVID-19 vaccinated and we opted to not test patient at this time. Encouraged patient to seek attention if worsens or persistent lack of improvement.  Other orders -     azithromycin (ZITHROMAX) 250 MG tablet; Take two tablets on day 1, then one daily x 4 days -     budesonide-formoterol (SYMBICORT) 80-4.5 MCG/ACT inhaler; Inhale 2 puffs into the lungs 2 (two) times daily. -     fluticasone (FLONASE) 50 MCG/ACT nasal spray; Place 2 sprays into both nostrils daily.    . Reviewed expectations re: course of current medical issues. . Discussed self-management of symptoms. . Outlined signs and symptoms indicating need for more acute intervention. . Patient verbalized understanding and all questions were answered. Sarah Cardenas [facility] Health Maintenance issues including appropriate healthy diet, exercise, and smoking avoidance were discussed with patient. . See orders for this visit as  documented in the electronic medical record.  I discussed the assessment and treatment plan with the patient. The patient was provided an opportunity to ask questions and all were answered. The patient agreed with the plan and demonstrated an understanding of the instructions.   The patient was advised to call back or seek an in-person evaluation if the symptoms worsen or if the condition fails to improve as anticipated.   CMA or LPN served as scribe during this visit. History, Physical, and Plan performed by medical provider. The above documentation has been reviewed and is accurate and complete.   [place], [place] 03/22/2020

## 2020-03-29 ENCOUNTER — Ambulatory Visit (INDEPENDENT_AMBULATORY_CARE_PROVIDER_SITE_OTHER): Payer: No Typology Code available for payment source | Admitting: Family Medicine

## 2020-03-29 ENCOUNTER — Encounter (INDEPENDENT_AMBULATORY_CARE_PROVIDER_SITE_OTHER): Payer: Self-pay | Admitting: Family Medicine

## 2020-03-29 ENCOUNTER — Other Ambulatory Visit: Payer: Self-pay

## 2020-03-29 VITALS — BP 133/80 | HR 88 | Temp 97.9°F | Ht 66.0 in | Wt 276.0 lb

## 2020-03-29 DIAGNOSIS — E538 Deficiency of other specified B group vitamins: Secondary | ICD-10-CM | POA: Diagnosis not present

## 2020-03-29 DIAGNOSIS — Z9189 Other specified personal risk factors, not elsewhere classified: Secondary | ICD-10-CM

## 2020-03-29 DIAGNOSIS — Z6841 Body Mass Index (BMI) 40.0 and over, adult: Secondary | ICD-10-CM

## 2020-03-29 DIAGNOSIS — E559 Vitamin D deficiency, unspecified: Secondary | ICD-10-CM | POA: Diagnosis not present

## 2020-03-29 MED ORDER — VITAMIN D (ERGOCALCIFEROL) 1.25 MG (50000 UNIT) PO CAPS: 50000.0000 [IU] | ORAL_CAPSULE | ORAL | 0 refills | Status: DC

## 2020-03-29 NOTE — Progress Notes (Signed)
Chief Complaint:   OBESITY Sarah Cardenas is here to discuss her progress with her obesity treatment plan along with follow-up of her obesity related diagnoses. Sarah Cardenas is on following a lower carbohydrate, vegetable and lean protein rich diet plan and states she is following her eating plan approximately 90% of the time. Sarah Cardenas states she is exercising for 0 minutes 0 times per week.  Today's visit was #: 3 Starting weight: 273 lbs Starting date: 02/24/2020 Today's weight: 276 lbs Today's date: 03/29/2020 Total lbs lost to date: 0 Total lbs lost since last in-office visit: 0  Interim History: Sarah Cardenas says that she "cheats on the weekends".  She recently had bronchitis and says she says is feeling much better and is okay to exercise.  She reports that she is drinking more water.  Assessment/Plan:   1. Vitamin D deficiency Sarah Cardenas's Vitamin D level was 25.7 on 02/24/2020. She is currently taking prescription vitamin D 50,000 IU each week.   Low Vitamin D level contributes to fatigue and are associated with obesity, breast, and colon cancer. She agrees to continue to take prescription Vitamin D @50 ,000 IU every week and will follow-up for routine testing of Vitamin D, at least 2-3 times per year to avoid over-replacement.  Orders - Vitamin D, Ergocalciferol, (DRISDOL) 1.25 MG (50000 UNIT) CAPS capsule; Take 1 capsule (50,000 Units total) by mouth every 7 (seven) days.  Dispense: 4 capsule; Refill: 0  2. B12 deficiency The diagnosis was reviewed with the patient. Counseling provided today, see below. We will continue to monitor. Orders and follow up as documented in patient record.  Counseling . The body needs vitamin B12: to make red blood cells; to make DNA; and to help the nerves work properly so they can carry messages from the brain to the body.  . The main causes of vitamin B12 deficiency include dietary deficiency, digestive diseases, pernicious anemia, and having a surgery in which part of the  stomach or small intestine is removed.  . Certain medicines can make it harder for the body to absorb vitamin B12. These medicines include: heartburn medications; some antibiotics; some medications used to treat diabetes, gout, and high cholesterol.  . In some cases, there are no symptoms of this condition. If the condition leads to anemia or nerve damage, various symptoms can occur, such as weakness or fatigue, shortness of breath, and numbness or tingling in your hands and feet.   . Treatment:  o May include taking vitamin B12 supplements.  o Avoid alcohol.  o Eat lots of healthy foods that contain vitamin B12: - Beef, pork, chicken, [place], and organ meats, such as liver.  - Seafood: This includes clams, rainbow trout, salmon, tuna, and haddock. Eggs.  - Cereal and dairy products that are fortified: This means that vitamin B12 has been added to the food.   3. At risk for heart disease Sarah Cardenas was given approximately 15 minutes of coronary artery disease prevention counseling today. She is 34 y.o. female and has risk factors for heart disease including obesity. We discussed intensive lifestyle modifications today with an emphasis on specific weight loss instructions and strategies.   Repetitive spaced learning was employed today to elicit superior memory formation and behavioral change.  4. Class 3 severe obesity with serious comorbidity and body mass index (BMI) of 40.0 to 44.9 in adult, unspecified obesity type ([place]) Sarah Cardenas is currently in the action stage of change. As such, her goal is to continue with weight loss efforts. She has  agreed to keeping a food journal and adhering to recommended goals of 1000-1200 calories and 85+ grams of protein.   Exercise goals: For substantial health benefits, adults should do at least 150 minutes (2 hours and 30 minutes) a week of moderate-intensity, or 75 minutes (1 hour and 15 minutes) a week of vigorous-intensity aerobic physical activity, or an equivalent  combination of moderate- and vigorous-intensity aerobic activity. Aerobic activity should be performed in episodes of at least 10 minutes, and preferably, it should be spread throughout the week. Walk 3 times a week for 30 minutes "outside in neighborhood".  Behavioral modification strategies: increasing lean protein intake, increasing water intake, better snacking choices and keeping a strict food journal.  Sarah Cardenas has agreed to follow-up with our clinic in 2-3 weeks. She was informed of the importance of frequent follow-up visits to maximize her success with intensive lifestyle modifications for her multiple health conditions.   Objective:   Blood pressure 133/80, pulse 88, temperature 97.9 F (36.6 C), temperature source Oral, height 5\' 6"  (1.676 m), weight 276 lb (125.2 kg), SpO2 97 %. Body mass index is 44.55 kg/m.  General: Cooperative, alert, well developed, in no acute distress. HEENT: Conjunctivae and lids unremarkable. Cardiovascular: Regular rhythm.  Lungs: Normal work of breathing. Neurologic: No focal deficits.   Lab Results  Component Value Date   CREATININE 1.01 (H) 02/24/2020   BUN 15 02/24/2020   NA 140 02/24/2020   K 4.6 02/24/2020   CL 105 02/24/2020   CO2 20 02/24/2020   Lab Results  Component Value Date   ALT 14 02/24/2020   AST 15 02/24/2020   ALKPHOS 107 02/24/2020   BILITOT 0.3 02/24/2020   Lab Results  Component Value Date   HGBA1C 5.2 02/24/2020   Lab Results  Component Value Date   INSULIN 12.0 02/24/2020   Lab Results  Component Value Date   TSH 2.350 02/24/2020   Lab Results  Component Value Date   CHOL 239 (H) 02/24/2020   HDL 54 02/24/2020   LDLCALC 168 (H) 02/24/2020   LDLDIRECT 161.0 12/07/2018   TRIG 94 02/24/2020   CHOLHDL 4.4 02/24/2020   Lab Results  Component Value Date   WBC 9.3 02/24/2020   HGB 13.6 02/24/2020   HCT 41.5 02/24/2020   MCV 84 02/24/2020   PLT 270 02/24/2020   Lab Results  Component Value Date   IRON  83 02/24/2020   TIBC 358 02/24/2020   FERRITIN 13 (L) 02/24/2020   Attestation Statements:   Reviewed by clinician on day of visit: allergies, medications, problem list, medical history, surgical history, family history, social history, and previous encounter notes.  I, 04/25/2020, CMA, am acting as transcriptionist for Insurance claims handler, DO  I have reviewed the above documentation for accuracy and completeness, and I agree with the above. Helane Rima, DO

## 2020-04-04 ENCOUNTER — Other Ambulatory Visit: Payer: Self-pay | Admitting: Physician Assistant

## 2020-04-04 ENCOUNTER — Encounter: Payer: Self-pay | Admitting: Physician Assistant

## 2020-04-04 DIAGNOSIS — H9190 Unspecified hearing loss, unspecified ear: Secondary | ICD-10-CM

## 2020-04-12 ENCOUNTER — Ambulatory Visit (INDEPENDENT_AMBULATORY_CARE_PROVIDER_SITE_OTHER): Payer: No Typology Code available for payment source | Admitting: Family Medicine

## 2020-04-13 ENCOUNTER — Other Ambulatory Visit: Payer: Self-pay

## 2020-04-13 ENCOUNTER — Ambulatory Visit (INDEPENDENT_AMBULATORY_CARE_PROVIDER_SITE_OTHER): Payer: No Typology Code available for payment source | Admitting: Family Medicine

## 2020-04-13 ENCOUNTER — Encounter (INDEPENDENT_AMBULATORY_CARE_PROVIDER_SITE_OTHER): Payer: Self-pay | Admitting: Adult Health

## 2020-04-13 ENCOUNTER — Ambulatory Visit (INDEPENDENT_AMBULATORY_CARE_PROVIDER_SITE_OTHER): Payer: No Typology Code available for payment source | Admitting: Adult Health

## 2020-04-13 VITALS — BP 116/75 | HR 92 | Temp 97.4°F | Ht 66.0 in | Wt 281.0 lb

## 2020-04-13 DIAGNOSIS — F319 Bipolar disorder, unspecified: Secondary | ICD-10-CM | POA: Diagnosis not present

## 2020-04-13 DIAGNOSIS — E559 Vitamin D deficiency, unspecified: Secondary | ICD-10-CM | POA: Diagnosis not present

## 2020-04-13 DIAGNOSIS — Z6841 Body Mass Index (BMI) 40.0 and over, adult: Secondary | ICD-10-CM | POA: Diagnosis not present

## 2020-04-17 DIAGNOSIS — F319 Bipolar disorder, unspecified: Secondary | ICD-10-CM | POA: Insufficient documentation

## 2020-04-17 DIAGNOSIS — E559 Vitamin D deficiency, unspecified: Secondary | ICD-10-CM | POA: Insufficient documentation

## 2020-04-17 NOTE — Progress Notes (Signed)
Chief Complaint:   OBESITY Sarah Cardenas is here to discuss her progress with her obesity treatment plan along with follow-up of her obesity related diagnoses. Sarah Cardenas is on keeping a food journal and adhering to recommended goals of 1200 calories and 86 grams of protein and states she is following her eating plan approximately 95% of the time. Sarah Cardenas states she is walking for 30 minutes 2 times per week.  Today's visit was #: 4 Starting weight: 273 lbs Starting date: 02/24/2020 Today's weight: 281 lbs Today's date: 04/13/2020 Total lbs lost to date: 0 Total lbs lost since last in-office visit: 0  Interim History: [name] is meeting her calorie/protein goals 95% of the time and denies polyphagia.  She has been focusing on protein with each meal.  She will often skip lunch due to not being hungry.  Subjective:   1. Vitamin D deficiency Sarah Cardenas's Vitamin D level was 25.7 on 02/24/2020. She is currently taking prescription vitamin D 50,000 IU each week. She denies nausea, vomiting or muscle weakness.  2. Bipolar 1 disorder ([place]) She reports stable mood.  Denies SI/HI.  She is currently on Lamictal 200 mg twice daily and sertraline 100 mg daily.  She feels that her current mental health prescriptions are exactly what she needs to manager her mood/depression.  Assessment/Plan:   1. Vitamin D deficiency Low Vitamin D level contributes to fatigue and are associated with obesity, breast, and colon cancer. She agrees to continue to take prescription Vitamin D @50 ,000 IU every week and will follow-up for routine testing of Vitamin D, at least 2-3 times per year to avoid over-replacement.  Will check labs in 3 months.  2. Bipolar 1 disorder ([place]) Continue current mental health prescriptions.   3. Class 3 severe obesity with serious comorbidity and body mass index (BMI) of 45.0 to 49.9 in adult, unspecified obesity type ([place]) Sarah Cardenas is currently in the action stage of change. As such, her goal is to continue  with weight loss efforts. She has agreed to keeping a food journal and adhering to recommended goals of 1000-1200 calories and 85+ grams of protein.   Exercise goals: As is.  Behavioral modification strategies: increasing lean protein intake, decreasing simple carbohydrates, no skipping meals, meal planning and cooking strategies, planning for success and keeping a strict food journal.  Sarah Cardenas has agreed to follow-up with our clinic in 2 weeks. She was informed of the importance of frequent follow-up visits to maximize her success with intensive lifestyle modifications for her multiple health conditions.   Objective:   Blood pressure 116/75, pulse 92, temperature (!) 97.4 F (36.3 C), temperature source Oral, height 5\' 6"  (1.676 m), weight 281 lb (127.5 kg), SpO2 100 %. Body mass index is 45.35 kg/m.  General: Cooperative, alert, well developed, in no acute distress. HEENT: Conjunctivae and lids unremarkable. Cardiovascular: Regular rhythm.  Lungs: Normal work of breathing. Neurologic: No focal deficits.   Lab Results  Component Value Date   CREATININE 1.01 (H) 02/24/2020   BUN 15 02/24/2020   NA 140 02/24/2020   K 4.6 02/24/2020   CL 105 02/24/2020   CO2 20 02/24/2020   Lab Results  Component Value Date   ALT 14 02/24/2020   AST 15 02/24/2020   ALKPHOS 107 02/24/2020   BILITOT 0.3 02/24/2020   Lab Results  Component Value Date   HGBA1C 5.2 02/24/2020   Lab Results  Component Value Date   INSULIN 12.0 02/24/2020   Lab Results  Component Value Date  TSH 2.350 02/24/2020   Lab Results  Component Value Date   CHOL 239 (H) 02/24/2020   HDL 54 02/24/2020   LDLCALC 168 (H) 02/24/2020   LDLDIRECT 161.0 12/07/2018   TRIG 94 02/24/2020   CHOLHDL 4.4 02/24/2020   Lab Results  Component Value Date   WBC 9.3 02/24/2020   HGB 13.6 02/24/2020   HCT 41.5 02/24/2020   MCV 84 02/24/2020   PLT 270 02/24/2020   Lab Results  Component Value Date   IRON 83 02/24/2020    TIBC 358 02/24/2020   FERRITIN 13 (L) 02/24/2020   Attestation Statements:   Reviewed by clinician on day of visit: allergies, medications, problem list, medical history, surgical history, family history, social history, and previous encounter notes.  Time spent on visit including pre-visit chart review and post-visit care and charting was 28 minutes.   I, Insurance claims handler, CMA, am acting as Energy manager for Sarah Hamburger, NP.  I have reviewed the above documentation for accuracy and completeness, and I agree with the above. -  Julaine Fusi, NP

## 2020-04-26 ENCOUNTER — Encounter: Payer: Self-pay | Admitting: Family Medicine

## 2020-04-27 ENCOUNTER — Ambulatory Visit (INDEPENDENT_AMBULATORY_CARE_PROVIDER_SITE_OTHER): Payer: No Typology Code available for payment source | Admitting: Adult Health

## 2020-04-27 ENCOUNTER — Encounter (INDEPENDENT_AMBULATORY_CARE_PROVIDER_SITE_OTHER): Payer: Self-pay | Admitting: Adult Health

## 2020-04-27 ENCOUNTER — Other Ambulatory Visit: Payer: Self-pay

## 2020-04-27 VITALS — BP 136/81 | HR 82 | Temp 97.5°F | Ht 66.0 in | Wt 280.0 lb

## 2020-04-27 DIAGNOSIS — F17200 Nicotine dependence, unspecified, uncomplicated: Secondary | ICD-10-CM

## 2020-04-27 DIAGNOSIS — Z6841 Body Mass Index (BMI) 40.0 and over, adult: Secondary | ICD-10-CM

## 2020-04-27 DIAGNOSIS — E88819 Insulin resistance, unspecified: Secondary | ICD-10-CM

## 2020-04-27 DIAGNOSIS — E66813 Obesity, class 3: Secondary | ICD-10-CM

## 2020-04-27 DIAGNOSIS — Z9189 Other specified personal risk factors, not elsewhere classified: Secondary | ICD-10-CM

## 2020-04-27 DIAGNOSIS — E559 Vitamin D deficiency, unspecified: Secondary | ICD-10-CM | POA: Diagnosis not present

## 2020-04-27 DIAGNOSIS — E8881 Metabolic syndrome: Secondary | ICD-10-CM

## 2020-04-27 MED ORDER — METFORMIN HCL 500 MG PO TABS: 500.0000 mg | ORAL_TABLET | Freq: Every day | ORAL | 0 refills | Status: DC

## 2020-04-27 MED ORDER — VITAMIN D (ERGOCALCIFEROL) 1.25 MG (50000 UNIT) PO CAPS: 50000.0000 [IU] | ORAL_CAPSULE | ORAL | 0 refills | Status: DC

## 2020-05-01 DIAGNOSIS — E8881 Metabolic syndrome: Secondary | ICD-10-CM | POA: Insufficient documentation

## 2020-05-01 DIAGNOSIS — F17200 Nicotine dependence, unspecified, uncomplicated: Secondary | ICD-10-CM | POA: Insufficient documentation

## 2020-05-01 NOTE — Progress Notes (Signed)
Chief Complaint:   OBESITY Sarah Cardenas is here to discuss her progress with her obesity treatment plan along with follow-up of her obesity related diagnoses. 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 Sarah Cardenas states she is walking 20-30 minutes 2-3 times per week.  Today's visit was #: 5 Starting weight: 273 lbs Starting date: 02/24/2020 Today's weight: 280 lbs Today's date: 04/27/2020 Total lbs lost to date: 0 Total lbs lost since last in-office visit: 1  Interim History: Sarah Cardenas typically consumes oats overnight for breakfast, protein bar for lunch, and baked chicken for dinner. She stayed on track over the long holiday weekend by focusing on protein and avoiding carbohydrates and sugar.  Subjective:   Vitamin D deficiency. Sarah Cardenas is on prescription strength Vitamin D supplementation and is tolerating it well. No nausea, vomiting, or muscle weakness.    Ref. Range 02/24/2020 11:12  Vitamin D, 25-Hydroxy Latest Ref Range: 30.0 - 100.0 ng/mL 25.7 (L)   Insulin resistance. Sarah Cardenas has a diagnosis of insulin resistance based on her elevated fasting insulin level >5. She continues to work on diet and exercise to decrease her risk of diabetes. Today is Sarah Cardenas's 5th visit and she has only lost 1 lb despite consistent following of the plan. We discussed risks and benefits of metformin, and she is agreeable to starting low dose metformin.  Lab Results  Component Value Date   INSULIN 12.0 02/24/2020   Lab Results  Component Value Date   HGBA1C 5.2 02/24/2020   Tobacco use disorder. Sarah Cardenas recently changed from menthol cigarettes to plain cigarettes and is smoking 3/4 pack per day. She declined tobacco cessation today.  At risk for diarrhea. Sarah Cardenas is at higher risk of diarrhea due to starting on metformin to treat insulin  resistance.  Assessment/Plan:   Vitamin D deficiency. Low Vitamin D level contributes to fatigue and are associated with obesity, breast, and colon cancer. She was given a refill on her Vitamin D, Ergocalciferol, (DRISDOL) 1.25 MG (50000 UNIT) CAPS capsule every week #4 with 0 refills and will follow-up for routine testing of Vitamin D every 3 months.   Insulin resistance. Sarah Cardenas will continue to work on weight loss, exercise, and decreasing simple carbohydrates to help decrease the risk of diabetes. Chau agreed to follow-up with [place] as directed to closely monitor her progress. She will start metFORMIN (GLUCOPHAGE) 500 MG tablet with breakfast #30 with 0 refills and will have labs checked every 3 months.  Tobacco use disorder. Sarah Cardenas will try to reduce to stop tobacco use - you can do it!  At risk for diarrhea. Sarah Cardenas was given approximately 15 minutes of diarrhea prevention counseling today. She is 34 y.o. female and has risk factors for diarrhea including medications and changes in diet. We discussed intensive lifestyle modifications today with an emphasis on specific weight loss instructions including dietary strategies.   Repetitive spaced learning was employed today to elicit superior memory formation and behavioral change.  Class 3 severe obesity with serious comorbidity and body mass index (BMI) of 45.0 to 49.9 in adult, unspecified obesity type (HCC).  Sarah Cardenas is currently in the action stage of change. As such, her goal is to continue with weight loss efforts. She has agreed to keeping a food journal and adhering to recommended goals of 1000-1200 calories and 85+ grams of protein daily.   Exercise  goals: Sarah Cardenas will continue her current exercise regimen.   Behavioral modification strategies: increasing lean protein intake, meal planning and cooking strategies, travel eating strategies, planning for success and keeping a strict food journal.  Sarah Cardenas has agreed to follow-up with our clinic in 2  weeks. She was informed of the importance of frequent follow-up visits to maximize her success with intensive lifestyle modifications for her multiple health conditions.   Objective:   Blood pressure 136/81, pulse 82, temperature (!) 97.5 F (36.4 C), temperature source Oral, height 5\' 6"  (1.676 m), weight 280 lb (127 kg), SpO2 96 %. Body mass index is 45.19 kg/m.  General: Cooperative, alert, well developed, in no acute distress. HEENT: Conjunctivae and lids unremarkable. Cardiovascular: Regular rhythm.  Lungs: Normal work of breathing. Neurologic: No focal deficits.   Lab Results  Component Value Date   CREATININE 1.01 (H) 02/24/2020   BUN 15 02/24/2020   NA 140 02/24/2020   K 4.6 02/24/2020   CL 105 02/24/2020   CO2 20 02/24/2020   Lab Results  Component Value Date   ALT 14 02/24/2020   AST 15 02/24/2020   ALKPHOS 107 02/24/2020   BILITOT 0.3 02/24/2020   Lab Results  Component Value Date   HGBA1C 5.2 02/24/2020   Lab Results  Component Value Date   INSULIN 12.0 02/24/2020   Lab Results  Component Value Date   TSH 2.350 02/24/2020   Lab Results  Component Value Date   CHOL 239 (H) 02/24/2020   HDL 54 02/24/2020   LDLCALC 168 (H) 02/24/2020   LDLDIRECT 161.0 12/07/2018   TRIG 94 02/24/2020   CHOLHDL 4.4 02/24/2020   Lab Results  Component Value Date   WBC 9.3 02/24/2020   HGB 13.6 02/24/2020   HCT 41.5 02/24/2020   MCV 84 02/24/2020   PLT 270 02/24/2020   Lab Results  Component Value Date   IRON 83 02/24/2020   TIBC 358 02/24/2020   FERRITIN 13 (L) 02/24/2020   Attestation Statements:   Reviewed by clinician on day of visit: allergies, medications, problem list, medical history, surgical history, family history, social history, and previous encounter notes.  I, 04/25/2020, am acting as [place] [employer] for Energy manager, NP-C   I have reviewed the above documentation for accuracy and completeness, and I agree with the above. -  [employer],  NP

## 2020-05-15 ENCOUNTER — Ambulatory Visit (INDEPENDENT_AMBULATORY_CARE_PROVIDER_SITE_OTHER): Payer: No Typology Code available for payment source | Admitting: Family Medicine

## 2020-05-15 ENCOUNTER — Other Ambulatory Visit: Payer: Self-pay

## 2020-05-15 ENCOUNTER — Encounter (INDEPENDENT_AMBULATORY_CARE_PROVIDER_SITE_OTHER): Payer: Self-pay | Admitting: Family Medicine

## 2020-05-15 VITALS — BP 119/72 | HR 91 | Temp 98.1°F | Ht 66.0 in | Wt 281.0 lb

## 2020-05-15 DIAGNOSIS — E8881 Metabolic syndrome: Secondary | ICD-10-CM | POA: Diagnosis not present

## 2020-05-15 DIAGNOSIS — Z9189 Other specified personal risk factors, not elsewhere classified: Secondary | ICD-10-CM

## 2020-05-15 DIAGNOSIS — E559 Vitamin D deficiency, unspecified: Secondary | ICD-10-CM | POA: Diagnosis not present

## 2020-05-15 DIAGNOSIS — Z6841 Body Mass Index (BMI) 40.0 and over, adult: Secondary | ICD-10-CM

## 2020-05-15 MED ORDER — VITAMIN D (ERGOCALCIFEROL) 1.25 MG (50000 UNIT) PO CAPS: 50000.0000 [IU] | ORAL_CAPSULE | ORAL | 0 refills | Status: DC

## 2020-05-16 NOTE — Progress Notes (Signed)
Chief Complaint:   OBESITY Sarah Cardenas is here to discuss her progress with her obesity treatment plan along with follow-up of her obesity related diagnoses. Sarah Cardenas is on keeping a food journal and adhering to recommended goals of 1000-2000 calories and 85 grams of protein and states she is following her eating plan approximately 100% of the time. Sarah Cardenas states she is walking for 35 minutes 2-3 times per week.  Today's visit was #: 6 Starting weight: 273 lbs Starting date: 02/24/2020 Today's weight: 281 lbs Today's date: 05/11/2020 Total lbs lost to date: 0 Total lbs lost since last in-office visit: 0  Interim History:  Sarah Cardenas says she is not measuring or weighing her food.  She is occasionally using MFP and not tracking all the time.  Hence she is not sure of her caloric intake or protein intake despite those being her only goal measures.   However, she has had no sweets the last couple of weeks.  She says she is drinking all water and no other caloric beverages.  On some days, she says, she even hits 1 gallon of water.   Subjective:   1. Vitamin D deficiency Sarah Cardenas's Vitamin D level was 25.7 on 02/24/2020. She is currently taking prescription vitamin D 50,000 IU each week. She denies nausea, vomiting or muscle weakness.  2. Insulin resistance Sarah Cardenas has a diagnosis of insulin resistance based on her elevated fasting insulin level >5. She continues to work on diet and exercise to decrease her risk of diabetes.  At her last office visit on 04/27/2020, she was started on metformin with breakfast.  She says her cravings have improved some.  She says she has less hunger.  Lab Results  Component Value Date   INSULIN 12.0 02/24/2020   Lab Results  Component Value Date   HGBA1C 5.2 02/24/2020   3. At risk for deficient intake of food The patient is at a higher than average risk of deficient intake of food.  Assessment/Plan:   1. Vitamin D deficiency Low Vitamin D level contributes to fatigue  and are associated with obesity, breast, and colon cancer. She agrees to continue to take prescription Vitamin D @50 ,000 IU every week and will follow-up for routine testing of Vitamin D, at least 2-3 times per year to avoid over-replacement. - Vitamin D, Ergocalciferol, (DRISDOL) 1.25 MG (50000 UNIT) CAPS capsule; Take 1 capsule (50,000 Units total) by mouth every 7 (seven) days.  Dispense: 4 capsule; Refill: 0  2. Insulin resistance Sarah Cardenas will continue to work on weight loss, exercise, and decreasing simple carbohydrates to help decrease the risk of diabetes. Sarah Cardenas agreed to follow-up with as directed to closely monitor her progress.  Continue metformin for now.  3. At risk for deficient intake of food Sarah Cardenas was given approximately 15 minutes of deficit intake of food prevention counseling today. Sarah Cardenas is at risk for eating too few calories based on current food recall. She was encouraged to focus on meeting caloric and protein goals according to her recommended meal plan.   4. Class 3 severe obesity with serious comorbidity and body mass index (BMI) of 45.0 to 49.9 in adult, unspecified obesity type (HCC) Sarah Cardenas is currently in the action stage of change. As such, her goal is to continue with weight loss efforts. She has agreed to keeping a food journal and adhering to recommended goals of 1200-1500 calories and 100+ grams of protein.   Exercise goals: As is.  Behavioral modification strategies: increasing lean protein intake,  meal planning and cooking strategies, keeping healthy foods in the home, planning for success and keeping a strict food journal.  Sarah Cardenas has agreed to follow-up with our clinic in 2 weeks. She was informed of the importance of frequent follow-up visits to maximize her success with intensive lifestyle modifications for her multiple health conditions.   Objective:   Blood pressure 119/72, pulse 91, temperature 98.1 F (36.7 C), height 5\' 6"  (1.676 m), weight (!) 281 lb  (127.5 kg), last menstrual period 05/15/2020, SpO2 96 %. Body mass index is 45.35 kg/m.  General: Cooperative, alert, well developed, in no acute distress. HEENT: Conjunctivae and lids unremarkable. Cardiovascular: Regular rhythm.  Lungs: Normal work of breathing. Neurologic: No focal deficits.   Lab Results  Component Value Date   CREATININE 1.01 (H) 02/24/2020   BUN 15 02/24/2020   NA 140 02/24/2020   K 4.6 02/24/2020   CL 105 02/24/2020   CO2 20 02/24/2020   Lab Results  Component Value Date   ALT 14 02/24/2020   AST 15 02/24/2020   ALKPHOS 107 02/24/2020   BILITOT 0.3 02/24/2020   Lab Results  Component Value Date   HGBA1C 5.2 02/24/2020   Lab Results  Component Value Date   INSULIN 12.0 02/24/2020   Lab Results  Component Value Date   TSH 2.350 02/24/2020   Lab Results  Component Value Date   CHOL 239 (H) 02/24/2020   HDL 54 02/24/2020   LDLCALC 168 (H) 02/24/2020   LDLDIRECT 161.0 12/07/2018   TRIG 94 02/24/2020   CHOLHDL 4.4 02/24/2020   Lab Results  Component Value Date   WBC 9.3 02/24/2020   HGB 13.6 02/24/2020   HCT 41.5 02/24/2020   MCV 84 02/24/2020   PLT 270 02/24/2020   Lab Results  Component Value Date   IRON 83 02/24/2020   TIBC 358 02/24/2020   FERRITIN 13 (L) 02/24/2020   Attestation Statements:   Reviewed by clinician on day of visit: allergies, medications, problem list, medical history, surgical history, family history, social history, and previous encounter notes.  I, 04/25/2020, CMA, am acting as Insurance claims handler for Energy manager, DO.  I have reviewed the above documentation for accuracy and completeness, and I agree with the above. [employer], DO

## 2020-05-29 ENCOUNTER — Ambulatory Visit (INDEPENDENT_AMBULATORY_CARE_PROVIDER_SITE_OTHER): Payer: No Typology Code available for payment source | Admitting: Family Medicine

## 2020-05-30 ENCOUNTER — Other Ambulatory Visit (INDEPENDENT_AMBULATORY_CARE_PROVIDER_SITE_OTHER): Payer: Self-pay | Admitting: Family Medicine

## 2020-05-30 DIAGNOSIS — E8881 Metabolic syndrome: Secondary | ICD-10-CM

## 2020-05-30 NOTE — Telephone Encounter (Signed)
Patient called regarding a refill on her metFORMIN, please give patient a call.

## 2020-05-31 MED ORDER — METFORMIN HCL 500 MG PO TABS: 500.0000 mg | ORAL_TABLET | Freq: Every day | ORAL | 0 refills | Status: DC

## 2020-05-31 NOTE — Telephone Encounter (Signed)
Pt requesting refill on Metformin. Medication refill protocol sent to Dr Sharee Holster.

## 2020-06-08 ENCOUNTER — Encounter (INDEPENDENT_AMBULATORY_CARE_PROVIDER_SITE_OTHER): Payer: Self-pay | Admitting: Family Medicine

## 2020-06-08 ENCOUNTER — Other Ambulatory Visit: Payer: Self-pay

## 2020-06-08 ENCOUNTER — Ambulatory Visit (INDEPENDENT_AMBULATORY_CARE_PROVIDER_SITE_OTHER): Payer: No Typology Code available for payment source | Admitting: Family Medicine

## 2020-06-08 VITALS — BP 108/71 | HR 87 | Temp 97.7°F | Ht 66.0 in | Wt 272.0 lb

## 2020-06-08 DIAGNOSIS — E8881 Metabolic syndrome: Secondary | ICD-10-CM

## 2020-06-08 DIAGNOSIS — E559 Vitamin D deficiency, unspecified: Secondary | ICD-10-CM

## 2020-06-08 DIAGNOSIS — Z9189 Other specified personal risk factors, not elsewhere classified: Secondary | ICD-10-CM

## 2020-06-08 DIAGNOSIS — Z6841 Body Mass Index (BMI) 40.0 and over, adult: Secondary | ICD-10-CM

## 2020-06-08 DIAGNOSIS — E88819 Insulin resistance, unspecified: Secondary | ICD-10-CM

## 2020-06-08 MED ORDER — METFORMIN HCL 500 MG PO TABS: 500.0000 mg | ORAL_TABLET | Freq: Every day | ORAL | 0 refills | Status: DC

## 2020-06-08 MED ORDER — VITAMIN D (ERGOCALCIFEROL) 1.25 MG (50000 UNIT) PO CAPS: 50000.0000 [IU] | ORAL_CAPSULE | ORAL | 0 refills | Status: DC

## 2020-06-08 NOTE — Progress Notes (Signed)
Chief Complaint:   OBESITY Sarah Cardenas is here to discuss her progress with her obesity treatment plan along with follow-up of her obesity related diagnoses. Sarah Cardenas is on keeping a food journal and adhering to recommended goals of 1200-1500 calories and 100+ grams of protein daily and states she is following her eating plan approximately 50% of the time. Sarah Cardenas states she is walking for 30 minutes 3 times per week.  Today's visit was #: 7 Starting weight: 273 lbs Starting date: 02/24/2020 Today's weight: 272 lbs Today's date: 06/08/2020 Total lbs lost to date: 1 Total lbs lost since last in-office visit: 9  Interim History: Sarah Cardenas is journaling consistently. She is averaging 1,000 calories per day. She does not monitor her protein intake. She is skipping lunch and eating out at dinner. She says that she is skipping meals and this has been habitual for her. She does not cook. She usually has a [employer] bar at breakfast.  Subjective:   1. Insulin resistance Quin has a diagnosis of insulin resistance based on her elevated fasting insulin level >5. She denies polyphagia on metformin. She continues to work on diet and exercise to decrease her risk of diabetes.  Lab Results  Component Value Date   INSULIN 12.0 02/24/2020   Lab Results  Component Value Date   HGBA1C 5.2 02/24/2020   2. Vitamin D deficiency Sarah Cardenas's last Vit D level was low at 25.7. She is on weekly prescription Vit D.  3. At risk for deficient intake of food The patient is at a higher than average risk of deficient intake of food due to skipping meals and lack or protein.  Assessment/Plan:   1. Insulin resistance [place] will continue to work on weight loss, exercise, and decreasing simple carbohydrates to help decrease the risk of diabetes. We will refill metformin for 1 month. Sarah Cardenas agreed to follow-up with [place] as directed to closely monitor her progress.  - metFORMIN (GLUCOPHAGE) 500 MG tablet; Take 1 tablet (500 mg total)  by mouth daily with breakfast.  Dispense: 30 tablet; Refill: 0  2. Vitamin D deficiency Low Vitamin D level contributes to fatigue and are associated with obesity, breast, and colon cancer. We will refill prescription Vitamin D for 1 month. Sarah Cardenas will follow-up for routine testing of Vitamin D, at least 2-3 times per year to avoid over-replacement.  - Vitamin D, Ergocalciferol, (DRISDOL) 1.25 MG (50000 UNIT) CAPS capsule; Take 1 capsule (50,000 Units total) by mouth every 7 (seven) days.  Dispense: 4 capsule; Refill: 0  3. At risk for deficient intake of food Sarah Cardenas was given approximately 15 minutes of deficit intake of food prevention counseling today. Sarah Cardenas is at risk for eating too few calories based on current food recall. She was encouraged to focus on meeting caloric and protein goals according to her recommended meal plan.   4. Class 3 severe obesity with serious comorbidity and body mass index (BMI) of 40.0 to 44.9 in adult, unspecified obesity type (HCC) Sarah Cardenas is currently in the action stage of change. As such, her goal is to continue with weight loss efforts. She has agreed to keeping a food journal and adhering to recommended goals of 1000-1200 calories and 80 grams of protein daily.   Exercise goals: As is.  Behavioral modification strategies: increasing lean protein intake, decreasing simple carbohydrates, no skipping meals and meal planning and cooking strategies. She will start paying attention to protein intake.   Sarah Cardenas has agreed to follow-up with our clinic in 2  weeks. She was informed of the importance of frequent follow-up visits to maximize her success with intensive lifestyle modifications for her multiple health conditions.   Objective:   Blood pressure 108/71, pulse 87, temperature 97.7 F (36.5 C), temperature source Oral, height 5\' 6"  (1.676 m), weight 272 lb (123.4 kg), last menstrual period 05/15/2020, SpO2 95 %. Body mass index is 43.9 kg/m.  General:  Cooperative, alert, well developed, in no acute distress. HEENT: Conjunctivae and lids unremarkable. Cardiovascular: Regular rhythm.  Lungs: Normal work of breathing. Neurologic: No focal deficits.   Lab Results  Component Value Date   CREATININE 1.01 (H) 02/24/2020   BUN 15 02/24/2020   NA 140 02/24/2020   K 4.6 02/24/2020   CL 105 02/24/2020   CO2 20 02/24/2020   Lab Results  Component Value Date   ALT 14 02/24/2020   AST 15 02/24/2020   ALKPHOS 107 02/24/2020   BILITOT 0.3 02/24/2020   Lab Results  Component Value Date   HGBA1C 5.2 02/24/2020   Lab Results  Component Value Date   INSULIN 12.0 02/24/2020   Lab Results  Component Value Date   TSH 2.350 02/24/2020   Lab Results  Component Value Date   CHOL 239 (H) 02/24/2020   HDL 54 02/24/2020   LDLCALC 168 (H) 02/24/2020   LDLDIRECT 161.0 12/07/2018   TRIG 94 02/24/2020   CHOLHDL 4.4 02/24/2020   Lab Results  Component Value Date   WBC 9.3 02/24/2020   HGB 13.6 02/24/2020   HCT 41.5 02/24/2020   MCV 84 02/24/2020   PLT 270 02/24/2020   Lab Results  Component Value Date   IRON 83 02/24/2020   TIBC 358 02/24/2020   FERRITIN 13 (L) 02/24/2020   Attestation Statements:   Reviewed by clinician on day of visit: allergies, medications, problem list, medical history, surgical history, family history, social history, and previous encounter notes.   04/25/2020, am acting as Trude Mc[place]ney for Energy manager, FNP-C.  I have reviewed the above documentation for accuracy and completeness, and I agree with the above. - [place], FNP

## 2020-06-21 ENCOUNTER — Encounter (INDEPENDENT_AMBULATORY_CARE_PROVIDER_SITE_OTHER): Payer: Self-pay

## 2020-06-22 ENCOUNTER — Ambulatory Visit (INDEPENDENT_AMBULATORY_CARE_PROVIDER_SITE_OTHER): Payer: No Typology Code available for payment source | Admitting: Family Medicine

## 2020-06-22 ENCOUNTER — Other Ambulatory Visit: Payer: Self-pay

## 2020-06-22 ENCOUNTER — Encounter (INDEPENDENT_AMBULATORY_CARE_PROVIDER_SITE_OTHER): Payer: Self-pay | Admitting: Family Medicine

## 2020-06-22 VITALS — BP 118/75 | HR 70 | Temp 98.0°F | Ht 66.0 in | Wt 272.0 lb

## 2020-06-22 DIAGNOSIS — Z6841 Body Mass Index (BMI) 40.0 and over, adult: Secondary | ICD-10-CM

## 2020-06-22 DIAGNOSIS — E8881 Metabolic syndrome: Secondary | ICD-10-CM | POA: Diagnosis not present

## 2020-06-22 DIAGNOSIS — E88819 Insulin resistance, unspecified: Secondary | ICD-10-CM

## 2020-06-22 NOTE — Progress Notes (Signed)
Chief Complaint:   OBESITY Sarah Cardenas is here to discuss her progress with her obesity treatment plan along with follow-up of her obesity related diagnoses. Temperence is on keeping a food journal and adhering to recommended goals of 1000-1200 calories and 80 grams of protein daily and states she is following her eating plan approximately 100% of the time. [name] states she is walking for 30 minutes 3 times per week.  Today's visit was #: 8 Starting weight: 273 lbs Starting date: 02/24/2020 Today's weight: 272 lbs Today's date: 06/22/2020 Total lbs lost to date: 1 Total lbs lost since last in-office visit: 0  Interim History: [name] has instituted some changes. She has maintained her weight. She has added protein shakes 2 times per day (230 calories per shake). She has reduced meal skipping. She is journaling about every other day. She says that it takes time for her to make changes but she is working on it. She denies polyphagia or cravings. She is meeting her protein goals but going over on her calories.  Subjective:   1. Insulin resistance Jamina has a diagnosis of insulin resistance based on her elevated fasting insulin level >5. [name] is on metformin, and she denies polyphagia. She continues to work on diet and exercise to decrease her risk of diabetes.  Lab Results  Component Value Date   INSULIN 12.0 02/24/2020   Lab Results  Component Value Date   HGBA1C 5.2 02/24/2020   Assessment/Plan:   1. Insulin resistance Sarah Cardenas will continue metformin, and will continue to work on weight loss, exercise, and decreasing simple carbohydrates to help decrease the risk of diabetes.  2. Class 3 severe obesity with serious comorbidity and body mass index (BMI) of 40.0 to 44.9 in adult, unspecified obesity type (HCC) Sarah Cardenas is currently in the action stage of change. As such, her goal is to continue with weight loss efforts. She has agreed to keeping a food journal and adhering to recommended goals of  1100-1200 calories and 80-85 grams of protein daily.  [name] will have a protein bar (200-300 calories, 20 grams of protein) at breakfast as she prefers grab and go food sometimes, especially at breakfast. I advised trying to use food rather than protein bars/shakes when possible.  I gave her a suggested breakdown of calories and protein amounts per meal.  Exercise goals: As is.  Behavioral modification strategies: increasing lean protein intake, decreasing simple carbohydrates and keeping a strict food journal.  [name] has agreed to follow-up with our clinic in 2 to 3 weeks.   Objective:   Blood pressure 118/75, pulse 70, temperature 98 F (36.7 C), temperature source Oral, height 5\' 6"  (1.676 m), weight 272 lb (123.4 kg), SpO2 99 %. Body mass index is 43.9 kg/m.  General: Cooperative, alert, well developed, in no acute distress. HEENT: Conjunctivae and lids unremarkable. Cardiovascular: Regular rhythm.  Lungs: Normal work of breathing. Neurologic: No focal deficits.   Lab Results  Component Value Date   CREATININE 1.01 (H) 02/24/2020   BUN 15 02/24/2020   NA 140 02/24/2020   K 4.6 02/24/2020   CL 105 02/24/2020   CO2 20 02/24/2020   Lab Results  Component Value Date   ALT 14 02/24/2020   AST 15 02/24/2020   ALKPHOS 107 02/24/2020   BILITOT 0.3 02/24/2020   Lab Results  Component Value Date   HGBA1C 5.2 02/24/2020   Lab Results  Component Value Date   INSULIN 12.0 02/24/2020   Lab Results  Component  Value Date   TSH 2.350 02/24/2020   Lab Results  Component Value Date   CHOL 239 (H) 02/24/2020   HDL 54 02/24/2020   LDLCALC 168 (H) 02/24/2020   LDLDIRECT 161.0 12/07/2018   TRIG 94 02/24/2020   CHOLHDL 4.4 02/24/2020   Lab Results  Component Value Date   WBC 9.3 02/24/2020   HGB 13.6 02/24/2020   HCT 41.5 02/24/2020   MCV 84 02/24/2020   PLT 270 02/24/2020   Lab Results  Component Value Date   IRON 83 02/24/2020   TIBC 358 02/24/2020   FERRITIN  13 (L) 02/24/2020   Attestation Statements:   Reviewed by clinician on day of visit: allergies, medications, problem list, medical history, surgical history, family history, social history, and previous encounter notes.   Trude Mcburney, am acting as Energy manager for [place], FNP-C.  I have reviewed the above documentation for accuracy and completeness, and I agree with the above. - Jesse Sans, FNP

## 2020-07-11 ENCOUNTER — Other Ambulatory Visit: Payer: Self-pay

## 2020-07-11 ENCOUNTER — Ambulatory Visit (INDEPENDENT_AMBULATORY_CARE_PROVIDER_SITE_OTHER): Payer: No Typology Code available for payment source | Admitting: Family Medicine

## 2020-07-11 ENCOUNTER — Encounter (INDEPENDENT_AMBULATORY_CARE_PROVIDER_SITE_OTHER): Payer: Self-pay | Admitting: Family Medicine

## 2020-07-11 VITALS — BP 115/76 | HR 73 | Temp 98.1°F | Ht 66.0 in | Wt 268.0 lb

## 2020-07-11 DIAGNOSIS — Z9189 Other specified personal risk factors, not elsewhere classified: Secondary | ICD-10-CM

## 2020-07-11 DIAGNOSIS — F319 Bipolar disorder, unspecified: Secondary | ICD-10-CM | POA: Diagnosis not present

## 2020-07-11 DIAGNOSIS — E559 Vitamin D deficiency, unspecified: Secondary | ICD-10-CM | POA: Diagnosis not present

## 2020-07-11 DIAGNOSIS — E88819 Insulin resistance, unspecified: Secondary | ICD-10-CM

## 2020-07-11 DIAGNOSIS — E8881 Metabolic syndrome: Secondary | ICD-10-CM | POA: Diagnosis not present

## 2020-07-11 DIAGNOSIS — Z6841 Body Mass Index (BMI) 40.0 and over, adult: Secondary | ICD-10-CM

## 2020-07-11 MED ORDER — VITAMIN D (ERGOCALCIFEROL) 1.25 MG (50000 UNIT) PO CAPS: 50000.0000 [IU] | ORAL_CAPSULE | ORAL | 0 refills | Status: DC

## 2020-07-11 MED ORDER — METFORMIN HCL 500 MG PO TABS: 500.0000 mg | ORAL_TABLET | Freq: Every day | ORAL | 0 refills | Status: DC

## 2020-07-12 ENCOUNTER — Encounter (INDEPENDENT_AMBULATORY_CARE_PROVIDER_SITE_OTHER): Payer: Self-pay | Admitting: Family Medicine

## 2020-07-12 NOTE — Progress Notes (Signed)
Chief Complaint:   OBESITY Sarah Cardenas is here to discuss her progress with her obesity treatment plan along with follow-up of her obesity related diagnoses. Sarah Cardenas is on keeping a food journal and adhering to recommended goals of 1100-1200 calories and 80-85 grams of protein daily and states she is following her eating plan approximately 50% of the time. Sarah Cardenas states she is walking for 20-30 minutes 2 times per week.  Today's visit was #: 9 Starting weight: 273 lbs Starting date: 02/24/2020 Today's weight: 268 lbs Today's date: 07/11/2020 Total lbs lost to date: 5 Total lbs lost since last in-office visit: 4  Interim History:  Sarah Cardenas is journaling every other day. She is focusing on protein at all meals. Her calories average between 800-900 calories she is almost hitting her protein goal of 80 grams daily. She denies excessive cravings. She has 1 shake and 2 meals per day so she has deceased her shake supplementation. She had been having 2 shakes and one meal per day.   Subjective:   1. Insulin resistance Sarah Cardenas denies polyphagia. She is on multiple psychotropic medications, so metformin is beneficial.   Lab Results  Component Value Date   INSULIN 12.0 02/24/2020   Lab Results  Component Value Date   HGBA1C 5.2 02/24/2020   2. Vitamin D deficiency Sarah Cardenas's last Vit D level was low at 25.7 on 02/24/2020. She is on weekly prescription Vit D.  3. Bipolar 1 disorder (HCC) Sarah Cardenas's bipolar is managed by Psychiatry. She sees a Therapist, sports every few months, and a counselor weekly. She would like to reduce the psychotropic medications that she takes.  4. At risk for deficient intake of food The patient is at a higher than average risk of deficient intake of food due to, too few calories per day (800).  Assessment/Plan:   1. Insulin resistance Sarah Cardenas will continue to work on weight loss, exercise, and decreasing simple carbohydrates to help decrease the risk of diabetes. We will refill  metformin for 1 month, and we will recheck labs at her next office visit. Sarah Cardenas agreed to follow-up with [place] as directed to closely monitor her progress.  - metFORMIN (GLUCOPHAGE) 500 MG tablet; Take 1 tablet (500 mg total) by mouth daily with breakfast.  Dispense: 30 tablet; Refill: 0  2. Vitamin D deficiency Low Vitamin D level contributes to fatigue and are associated with obesity, breast, and colon cancer. We will refill prescription Vitamin D for 1 month. We will recheck labs at her next office visit. Sarah Cardenas will follow-up for routine testing of Vitamin D, at least 2-3 times per year to avoid over-replacement.  - Vitamin D, Ergocalciferol, (DRISDOL) 1.25 MG (50000 UNIT) CAPS capsule; Take 1 capsule (50,000 Units total) by mouth every 7 (seven) days.  Dispense: 4 capsule; Refill: 0  3. Bipolar 1 disorder (HCC) Sarah Cardenas will continue all her medications, and she will continue to follow up with Psychiatry and her counselor.  4. At risk for deficient intake of food Sarah Cardenas was given approximately 15 minutes of deficit intake of food prevention counseling today. Sarah Cardenas is at risk for eating too few calories based on current food recall. She was encouraged to focus on meeting caloric and protein goals according to her recommended meal plan.   5. Class 3 severe obesity with serious comorbidity and body mass index (BMI) of 40.0 to 44.9 in adult, unspecified obesity type (HCC) Sarah Cardenas is currently in the action stage of change. As such, her goal is to continue with weight  loss efforts. She has agreed to keeping a food journal and adhering to recommended goals of 1000-1200 calories and 80 grams of protein daily.   Sarah Cardenas is to increase her calories to 1,000 per day.  Exercise goals: As is.  Behavioral modification strategies: increasing lean protein intake.  Sarah Cardenas has agreed to follow-up with our clinic in 3 weeks. She was informed of the importance of frequent follow-up visits to maximize her success with  intensive lifestyle modifications for her multiple health conditions.   Objective:   Blood pressure 115/76, pulse 73, temperature 98.1 F (36.7 C), temperature source Oral, height 5\' 6"  (1.676 m), weight 268 lb (121.6 kg), SpO2 98 %. Body mass index is 43.26 kg/m.  General: Cooperative, alert, well developed, in no acute distress. HEENT: Conjunctivae and lids unremarkable. Cardiovascular: Regular rhythm.  Lungs: Normal work of breathing. Neurologic: No focal deficits.   Lab Results  Component Value Date   CREATININE 1.01 (H) 02/24/2020   BUN 15 02/24/2020   NA 140 02/24/2020   K 4.6 02/24/2020   CL 105 02/24/2020   CO2 20 02/24/2020   Lab Results  Component Value Date   ALT 14 02/24/2020   AST 15 02/24/2020   ALKPHOS 107 02/24/2020   BILITOT 0.3 02/24/2020   Lab Results  Component Value Date   HGBA1C 5.2 02/24/2020   Lab Results  Component Value Date   INSULIN 12.0 02/24/2020   Lab Results  Component Value Date   TSH 2.350 02/24/2020   Lab Results  Component Value Date   CHOL 239 (H) 02/24/2020   HDL 54 02/24/2020   LDLCALC 168 (H) 02/24/2020   LDLDIRECT 161.0 12/07/2018   TRIG 94 02/24/2020   CHOLHDL 4.4 02/24/2020   Lab Results  Component Value Date   WBC 9.3 02/24/2020   HGB 13.6 02/24/2020   HCT 41.5 02/24/2020   MCV 84 02/24/2020   PLT 270 02/24/2020   Lab Results  Component Value Date   IRON 83 02/24/2020   TIBC 358 02/24/2020   FERRITIN 13 (L) 02/24/2020   Attestation Statements:   Reviewed by clinician on day of visit: allergies, medications, problem list, medical history, surgical history, family history, social history, and previous encounter notes.   04/25/2020, am acting as Trude Mcburney for Energy manager, FNP-C.  I have reviewed the above documentation for accuracy and completeness, and I agree with the above. -  [place], FNP

## 2020-08-01 ENCOUNTER — Encounter (INDEPENDENT_AMBULATORY_CARE_PROVIDER_SITE_OTHER): Payer: Self-pay | Admitting: Family Medicine

## 2020-08-01 ENCOUNTER — Ambulatory Visit (INDEPENDENT_AMBULATORY_CARE_PROVIDER_SITE_OTHER): Payer: No Typology Code available for payment source | Admitting: Family Medicine

## 2020-08-01 ENCOUNTER — Other Ambulatory Visit: Payer: Self-pay

## 2020-08-01 VITALS — BP 100/73 | HR 89 | Temp 98.1°F | Ht 66.0 in | Wt 267.0 lb

## 2020-08-01 DIAGNOSIS — Z9189 Other specified personal risk factors, not elsewhere classified: Secondary | ICD-10-CM | POA: Diagnosis not present

## 2020-08-01 DIAGNOSIS — E7849 Other hyperlipidemia: Secondary | ICD-10-CM | POA: Diagnosis not present

## 2020-08-01 DIAGNOSIS — D509 Iron deficiency anemia, unspecified: Secondary | ICD-10-CM | POA: Insufficient documentation

## 2020-08-01 DIAGNOSIS — E8881 Metabolic syndrome: Secondary | ICD-10-CM

## 2020-08-01 DIAGNOSIS — E559 Vitamin D deficiency, unspecified: Secondary | ICD-10-CM | POA: Diagnosis not present

## 2020-08-01 DIAGNOSIS — E88819 Insulin resistance, unspecified: Secondary | ICD-10-CM

## 2020-08-01 DIAGNOSIS — Z6841 Body Mass Index (BMI) 40.0 and over, adult: Secondary | ICD-10-CM

## 2020-08-01 MED ORDER — FERROUS SULFATE 325 (65 FE) MG PO TBEC: 325.0000 mg | DELAYED_RELEASE_TABLET | Freq: Every day | ORAL | 3 refills | Status: DC

## 2020-08-01 NOTE — Progress Notes (Signed)
Chief Complaint:   OBESITY Sarah Cardenas is here to discuss her progress with her obesity treatment plan along with follow-up of her obesity related diagnoses. Sarah Cardenas is on keeping a food journal and adhering to recommended goals of 1000-1200 calories and 80 grams of protein daily and states she is following her eating plan approximately 80% of the time. Sarah Cardenas states she is walking for 30 minutes 3 times per week.  Today's visit was #: 10 Starting weight: 273 lbs Starting date: 02/24/2020 Today's weight: 267 lbs Today's date: 08/01/2020 Total lbs lost to date: 6 Total lbs lost since last in-office visit: 1  Interim History: [name] notes she has not been tracking her intake the past few weeks. She does not feel hungry or snack much. Journaling is making her anxious and making her feel like a failure. She often skips breakfast.  Subjective:   1. Other hyperlipidemia Sarah Cardenas's last LDL  Was elevated at 168, and HDL and triglycerides within normal limits at her last check. She is not on statin. She denies any chest pain, claudication or myalgias.  Lab Results  Component Value Date   ALT 14 02/24/2020   AST 15 02/24/2020   ALKPHOS 107 02/24/2020   BILITOT 0.3 02/24/2020   Lab Results  Component Value Date   CHOL 239 (H) 02/24/2020   HDL 54 02/24/2020   LDLCALC 168 (H) 02/24/2020   LDLDIRECT 161.0 12/07/2018   TRIG 94 02/24/2020   CHOLHDL 4.4 02/24/2020   2. Insulin resistance Sarah Cardenas is on metformin daily 500 mg and she denies polyphagia. Last A1c was 5.2.   Lab Results  Component Value Date   INSULIN 12.0 02/24/2020   Lab Results  Component Value Date   HGBA1C 5.2 02/24/2020   3. Vitamin D deficiency Sarah Cardenas's last Vit D level was low at 25.7. She is on weekly prescription Vit D.  4. Iron deficiency anemia Sarah Cardenas is on iron once daily. She is taking it at breakfast with multivitamins. Last hemoglobin was 13.6. Ferritin was low at 13.  CBC Latest Ref Rng & Units 02/24/2020  11/16/2019 12/07/2018  WBC 3.4 - 10.8 x10E3/uL 9.3 10.2 12.4(H)  Hemoglobin 11.1 - 15.9 g/dL 40.9 81.1 91.4  Hematocrit 34.0 - 46.6 % 41.5 38.9 39.5  Platelets 150 - 450 x10E3/uL 270 277.0 263.0   Lab Results  Component Value Date   IRON 83 02/24/2020   TIBC 358 02/24/2020   FERRITIN 13 (L) 02/24/2020   Lab Results  Component Value Date   VITAMINB12 354 02/24/2020   5. At risk for deficient intake of food The patient is at a higher than average risk of deficient intake of food due to skipping meals.  Assessment/Plan:   1. Other hyperlipidemia  We will check labs today.   - Lipid Panel With LDL/HDL Ratio  2. Insulin resistance  We will check labs today. - Comprehensive metabolic panel - Hemoglobin A1c - Insulin, random  3. Vitamin D deficiency  We will check labs today. Sumer agreed to continue taking prescription Vitamin D 50,000 IU every week. - VITAMIN D 25 Hydroxy (Vit-D Deficiency, Fractures)  4. Iron deficiency anemia Told her to take iron at lunch.  - Vitamin B12 - Folate - Iron and TIBC - Ferritin - CBC with Differential/Platelet - ferrous sulfate 325 (65 FE) MG EC tablet; Take 1 tablet (325 mg total) by mouth daily with lunch.  Dispense: 30 tablet; Refill: 3  5. At risk for deficient intake of food Sarah Cardenas was given  approximately 15 minutes of deficit intake of food prevention counseling today. Sarah Cardenas is at risk for eating too few calories and too little protein based on current food recall. She was encouraged to focus on meeting caloric and protein goals according to her recommended meal plan.   6. Class 3 severe obesity with serious comorbidity and body mass index (BMI) of 40.0 to 44.9 in adult, unspecified obesity type (HCC) Sarah Cardenas is currently in the action stage of change. As such, her goal is to continue with weight loss efforts. She has agreed to practicing portion control and making smarter food choices, such as increasing vegetables and decreasing simple  carbohydrates.   Exercise goals: As is.  Behavioral modification strategies: increasing lean protein intake and no skipping meals.  Para has agreed to follow-up with our clinic in 2 weeks.  Sarah Cardenas was informed we would discuss her lab results at her next visit unless there is a critical issue that needs to be addressed sooner. Sarah Cardenas agreed to keep her next visit at the agreed upon time to discuss these results.  Objective:   Blood pressure 100/73, pulse 89, temperature 98.1 F (36.7 C), height 5\' 6"  (1.676 m), weight 267 lb (121.1 kg), SpO2 99 %. Body mass index is 43.09 kg/m.  General: Cooperative, alert, well developed, in no acute distress. HEENT: Conjunctivae and lids unremarkable. Cardiovascular: Regular rhythm.  Lungs: Normal work of breathing. Neurologic: No focal deficits.   Lab Results  Component Value Date   CREATININE 1.01 (H) 02/24/2020   BUN 15 02/24/2020   NA 140 02/24/2020   K 4.6 02/24/2020   CL 105 02/24/2020   CO2 20 02/24/2020   Lab Results  Component Value Date   ALT 14 02/24/2020   AST 15 02/24/2020   ALKPHOS 107 02/24/2020   BILITOT 0.3 02/24/2020   Lab Results  Component Value Date   HGBA1C 5.2 02/24/2020   Lab Results  Component Value Date   INSULIN 12.0 02/24/2020   Lab Results  Component Value Date   TSH 2.350 02/24/2020   Lab Results  Component Value Date   CHOL 239 (H) 02/24/2020   HDL 54 02/24/2020   LDLCALC 168 (H) 02/24/2020   LDLDIRECT 161.0 12/07/2018   TRIG 94 02/24/2020   CHOLHDL 4.4 02/24/2020   Lab Results  Component Value Date   WBC 9.3 02/24/2020   HGB 13.6 02/24/2020   HCT 41.5 02/24/2020   MCV 84 02/24/2020   PLT 270 02/24/2020   Lab Results  Component Value Date   IRON 83 02/24/2020   TIBC 358 02/24/2020   FERRITIN 13 (L) 02/24/2020   Attestation Statements:   Reviewed by clinician on day of visit: allergies, medications, problem list, medical history, surgical history, family history, social history,  and previous encounter notes.   04/25/2020, am acting as Trude Mcburney for Energy manager, FNP-C.  I have reviewed the above documentation for accuracy and completeness, and I agree with the above. -  [place], FNP

## 2020-08-02 ENCOUNTER — Encounter (INDEPENDENT_AMBULATORY_CARE_PROVIDER_SITE_OTHER): Payer: Self-pay | Admitting: Family Medicine

## 2020-08-02 DIAGNOSIS — E7849 Other hyperlipidemia: Secondary | ICD-10-CM | POA: Insufficient documentation

## 2020-08-02 LAB — CBC WITH DIFFERENTIAL/PLATELET
Basophils Absolute: 0.1 10*3/uL (ref 0.0–0.2)
Basos: 1 %
EOS (ABSOLUTE): 0.4 10*3/uL (ref 0.0–0.4)
Eos: 4 %
Hematocrit: 42.7 % (ref 34.0–46.6)
Hemoglobin: 13.9 g/dL (ref 11.1–15.9)
Immature Grans (Abs): 0 10*3/uL (ref 0.0–0.1)
Immature Granulocytes: 1 %
Lymphocytes Absolute: 2.8 10*3/uL (ref 0.7–3.1)
Lymphs: 33 %
MCH: 28.7 pg (ref 26.6–33.0)
MCHC: 32.6 g/dL (ref 31.5–35.7)
MCV: 88 fL (ref 79–97)
Monocytes Absolute: 0.4 10*3/uL (ref 0.1–0.9)
Monocytes: 5 %
Neutrophils Absolute: 5 10*3/uL (ref 1.4–7.0)
Neutrophils: 56 %
Platelets: 240 10*3/uL (ref 150–450)
RBC: 4.85 x10E6/uL (ref 3.77–5.28)
RDW: 14.1 % (ref 11.7–15.4)
WBC: 8.7 10*3/uL (ref 3.4–10.8)

## 2020-08-02 LAB — INSULIN, RANDOM: INSULIN: 15.7 u[IU]/mL (ref 2.6–24.9)

## 2020-08-02 LAB — HEMOGLOBIN A1C
Est. average glucose Bld gHb Est-mCnc: 105 mg/dL
Hgb A1c MFr Bld: 5.3 % (ref 4.8–5.6)

## 2020-08-02 LAB — LIPID PANEL WITH LDL/HDL RATIO
Cholesterol, Total: 241 mg/dL — ABNORMAL HIGH (ref 100–199)
HDL: 51 mg/dL (ref >39)
LDL Chol Calc (NIH): 163 mg/dL — ABNORMAL HIGH (ref 0–99)
LDL/HDL Ratio: 3.2 ratio (ref 0.0–3.2)
Triglycerides: 149 mg/dL (ref 0–149)
VLDL Cholesterol Cal: 27 mg/dL (ref 5–40)

## 2020-08-02 LAB — COMPREHENSIVE METABOLIC PANEL
ALT: 11 IU/L (ref 0–32)
AST: 16 IU/L (ref 0–40)
Albumin/Globulin Ratio: 2 (ref 1.2–2.2)
Albumin: 4.2 g/dL (ref 3.8–4.8)
Alkaline Phosphatase: 93 IU/L (ref 44–121)
BUN/Creatinine Ratio: 9 (ref 9–23)
BUN: 8 mg/dL (ref 6–20)
Bilirubin Total: 0.3 mg/dL (ref 0.0–1.2)
CO2: 22 mmol/L (ref 20–29)
Calcium: 9.2 mg/dL (ref 8.7–10.2)
Chloride: 103 mmol/L (ref 96–106)
Creatinine, Ser: 0.91 mg/dL (ref 0.57–1.00)
GFR calc Af Amer: 95 mL/min/{1.73_m2} (ref >59)
GFR calc non Af Amer: 82 mL/min/{1.73_m2} (ref >59)
Globulin, Total: 2.1 g/dL (ref 1.5–4.5)
Glucose: 82 mg/dL (ref 65–99)
Potassium: 4.6 mmol/L (ref 3.5–5.2)
Sodium: 139 mmol/L (ref 134–144)
Total Protein: 6.3 g/dL (ref 6.0–8.5)

## 2020-08-02 LAB — VITAMIN D 25 HYDROXY (VIT D DEFICIENCY, FRACTURES): Vit D, 25-Hydroxy: 45.1 ng/mL (ref 30.0–100.0)

## 2020-08-02 LAB — IRON AND TIBC
Iron Saturation: 21 % (ref 15–55)
Iron: 72 ug/dL (ref 27–159)
Total Iron Binding Capacity: 337 ug/dL (ref 250–450)
UIBC: 265 ug/dL (ref 131–425)

## 2020-08-02 LAB — VITAMIN B12: Vitamin B-12: 312 pg/mL (ref 232–1245)

## 2020-08-02 LAB — FERRITIN: Ferritin: 19 ng/mL (ref 15–150)

## 2020-08-02 LAB — FOLATE: Folate: >20 ng/mL (ref >3.0)

## 2020-08-15 ENCOUNTER — Encounter (INDEPENDENT_AMBULATORY_CARE_PROVIDER_SITE_OTHER): Payer: Self-pay | Admitting: Family Medicine

## 2020-08-15 ENCOUNTER — Other Ambulatory Visit: Payer: Self-pay

## 2020-08-15 ENCOUNTER — Ambulatory Visit (INDEPENDENT_AMBULATORY_CARE_PROVIDER_SITE_OTHER): Payer: No Typology Code available for payment source | Admitting: Family Medicine

## 2020-08-15 VITALS — BP 102/69 | HR 87 | Temp 98.0°F | Ht 66.0 in | Wt 264.0 lb

## 2020-08-15 DIAGNOSIS — E8881 Metabolic syndrome: Secondary | ICD-10-CM

## 2020-08-15 DIAGNOSIS — E559 Vitamin D deficiency, unspecified: Secondary | ICD-10-CM | POA: Diagnosis not present

## 2020-08-15 DIAGNOSIS — Z9189 Other specified personal risk factors, not elsewhere classified: Secondary | ICD-10-CM

## 2020-08-15 DIAGNOSIS — Z6841 Body Mass Index (BMI) 40.0 and over, adult: Secondary | ICD-10-CM

## 2020-08-15 DIAGNOSIS — E88819 Insulin resistance, unspecified: Secondary | ICD-10-CM

## 2020-08-15 MED ORDER — METFORMIN HCL 500 MG PO TABS: 500.0000 mg | ORAL_TABLET | Freq: Every day | ORAL | 0 refills | Status: DC

## 2020-08-15 MED ORDER — VITAMIN D (ERGOCALCIFEROL) 1.25 MG (50000 UNIT) PO CAPS: 50000.0000 [IU] | ORAL_CAPSULE | ORAL | 0 refills | Status: DC

## 2020-08-16 NOTE — Progress Notes (Signed)
Chief Complaint:   OBESITY Sarah Cardenas is here to discuss her progress with her obesity treatment plan along with follow-up of her obesity related diagnoses. Sarah Cardenas is on practicing portion control and making smarter food choices, such as increasing vegetables and decreasing simple carbohydrates and states she is following her eating plan approximately 50% of the time. Sarah Cardenas states she is doing 0 minutes 0 times per week.  Today's visit was #: 11 Starting weight: 273 lbs Starting date: 02/24/2020 Today's weight: 264 lbs Today's date: 08/15/2020 Total lbs lost to date: 9 Total lbs lost since last in-office visit: 3  Interim History: Sarah Cardenas has been eating regular meals. She is eating protein at all meals. Her dog passed away last week, and she has been struggling to eat well since then however. She does a great job with her water intake.  Subjective:   1. Insulin resistance Sarah Cardenas denies polyphagia on metformin, and last A1c was 5.3. She feels that metformin helps with hunger. . I discussed labs with the patient today.  Lab Results  Component Value Date   INSULIN 15.7 08/01/2020   INSULIN 12.0 02/24/2020   Lab Results  Component Value Date   HGBA1C 5.3 08/01/2020   2. Vitamin D deficiency Colletta's Vit D level has improved on weekly prescription Vit D, from 25.7 to 45.1. I discussed labs with the patient today.  3. At risk for diabetes mellitus Sarah Cardenas is at higher than average risk for developing diabetes due to her obesity and insulin resistance.   Assessment/Plan:   1. Insulin resistance  We will refill metformin for 1 month.  - metFORMIN (GLUCOPHAGE) 500 MG tablet; Take 1 tablet (500 mg total) by mouth daily with breakfast.  Dispense: 30 tablet; Refill: 0  2. Vitamin D deficiency  We will refill prescription Vitamin D for 1 month. [name] will follow-up for routine testing of Vitamin D, at least 2-3 times per year to avoid over-replacement.  - Vitamin D, Ergocalciferol,  (DRISDOL) 1.25 MG (50000 UNIT) CAPS capsule; Take 1 capsule (50,000 Units total) by mouth every 7 (seven) days.  Dispense: 4 capsule; Refill: 0  3. At risk for diabetes mellitus Sarah Cardenas was given approximately 15 minutes of diabetes education and counseling today. We discussed intensive lifestyle modifications today with an emphasis on weight loss as well as increasing exercise and decreasing simple carbohydrates in her diet. We also reviewed medication options with an emphasis on risk versus benefit of those discussed.  We discussed insulin resistance in depth and its possible progression to type 2 diabetes.    Repetitive spaced learning was employed today to elicit superior memory formation and behavioral change.  4. Class 3 severe obesity with serious comorbidity and body mass index (BMI) of 40.0 to 44.9 in adult, unspecified obesity type (HCC) Sarah Cardenas is currently in the action stage of change. As such, her goal is to continue with weight loss efforts. She has agreed to practicing portion control and making smarter food choices, such as increasing vegetables and decreasing simple carbohydrates.   Exercise goals: No exercise has been prescribed at this time.  Behavioral modification strategies: increasing lean protein intake and decreasing simple carbohydrates.  Sarah Cardenas has agreed to follow-up with our clinic in 2 to 3 weeks.   Objective:   Blood pressure 102/69, pulse 87, temperature 98 F (36.7 C), height 5\' 6"  (1.676 m), weight 264 lb (119.7 kg), SpO2 96 %. Body mass index is 42.61 kg/m.  General: Cooperative, alert, well developed, in no acute  distress. HEENT: Conjunctivae and lids unremarkable. Cardiovascular: Regular rhythm.  Lungs: Normal work of breathing. Neurologic: No focal deficits.   Lab Results  Component Value Date   CREATININE 0.91 08/01/2020   BUN 8 08/01/2020   NA 139 08/01/2020   K 4.6 08/01/2020   CL 103 08/01/2020   CO2 22 08/01/2020   Lab Results  Component  Value Date   ALT 11 08/01/2020   AST 16 08/01/2020   ALKPHOS 93 08/01/2020   BILITOT 0.3 08/01/2020   Lab Results  Component Value Date   HGBA1C 5.3 08/01/2020   HGBA1C 5.2 02/24/2020   Lab Results  Component Value Date   INSULIN 15.7 08/01/2020   INSULIN 12.0 02/24/2020   Lab Results  Component Value Date   TSH 2.350 02/24/2020   Lab Results  Component Value Date   CHOL 241 (H) 08/01/2020   HDL 51 08/01/2020   LDLCALC 163 (H) 08/01/2020   LDLDIRECT 161.0 12/07/2018   TRIG 149 08/01/2020   CHOLHDL 4.4 02/24/2020   Lab Results  Component Value Date   WBC 8.7 08/01/2020   HGB 13.9 08/01/2020   HCT 42.7 08/01/2020   MCV 88 08/01/2020   PLT 240 08/01/2020   Lab Results  Component Value Date   IRON 72 08/01/2020   TIBC 337 08/01/2020   FERRITIN 19 08/01/2020   Attestation Statements:   Reviewed by clinician on day of visit: allergies, medications, problem list, medical history, surgical history, family history, social history, and previous encounter notes.   Trude Mcburney, am acting as Energy manager for [place], FNP-C.  I have reviewed the above documentation for accuracy and completeness, and I agree with the above. -  Jesse Sans, FNP

## 2020-08-17 ENCOUNTER — Encounter (INDEPENDENT_AMBULATORY_CARE_PROVIDER_SITE_OTHER): Payer: Self-pay | Admitting: Family Medicine

## 2020-08-17 ENCOUNTER — Telehealth: Payer: No Typology Code available for payment source | Admitting: Emergency Medicine

## 2020-08-17 DIAGNOSIS — R062 Wheezing: Secondary | ICD-10-CM | POA: Diagnosis not present

## 2020-08-17 DIAGNOSIS — R059 Cough, unspecified: Secondary | ICD-10-CM | POA: Diagnosis not present

## 2020-08-17 MED ORDER — PREDNISONE 20 MG PO TABS: 40.0000 mg | ORAL_TABLET | Freq: Every day | ORAL | 0 refills | Status: AC

## 2020-08-17 MED ORDER — PROMETHAZINE-DM 6.25-15 MG/5ML PO SYRP: 5.0000 mL | ORAL_SOLUTION | Freq: Four times a day (QID) | ORAL | 0 refills | Status: DC | PRN

## 2020-08-17 MED ORDER — ALBUTEROL SULFATE (2.5 MG/3ML) 0.083% IN NEBU: 2.5000 mg | INHALATION_SOLUTION | Freq: Four times a day (QID) | RESPIRATORY_TRACT | 1 refills | Status: DC | PRN

## 2020-08-17 NOTE — Progress Notes (Signed)
Time spent: 10 min  Sarah Cardenas,                                                                                                   Based on what you have shared with me, it looks like you may have a flare up of your asthma.  Asthma is a chronic (ongoing) lung disease which results in airway obstruction, inflammation and hyper-responsiveness.   Asthma symptoms vary from person to person, with common symptoms including nighttime awakening and decreased ability to participate in normal activities as a result of shortness of breath. It is often triggered by changes in weather, changes in the season, changes in air temperature, or inside (home, school, daycare or work) allergens such as animal dander, mold, mildew, woodstoves or cockroaches.   It can also be triggered by hormonal changes, extreme emotion, physical exertion or an upper respiratory tract illness.     It is important to identify the trigger, and then eliminate or avoid the trigger if possible.   If you have been prescribed medications to be taken on a regular basis, it is important to follow the asthma action plan and to follow guidelines to adjust medication in response to increasing symptoms of decreased peak expiratory flow rate  Treatment: I have prescribed: Albuterol (Proventil HFA; Ventolin HFA) 108 (90 Base) MCG/ACT Inhaler 2 puffs into the lungs every six hours as needed for wheezing or shortness of breath and Prednisone 40mg  by mouth per day for 5 - 7 days.  Promethazine suspension for cough. You may use other over the counter medicines like intranasal sprays (flonase) for other symptoms.   Depending on when you last had your COVID vaccines or if you have had a booster, you may consider getting tested for COVID.  HOME CARE . Only take medications as instructed by your medical team. . Consider wearing a mask or scarf to improve breathing air temperature have been shown to decrease irritation and decrease exacerbations . Get  rest. . Taking a steamy shower or using a humidifier may help nasal congestion sand ease sore throat pain. You can place a towel over your head and breathe in the steam from hot water coming from a faucet. . Using a saline nasal spray works much the same way.  . Cough drops, hare candies and sore throat lozenges may ease your cough.  . Avoid close contacts especially the very you and the elderly . Cover your mouth if you cough or sneeze . Always remember to wash your hands.    GET HELP RIGHT AWAY IF: . You develop worsening symptoms; breathlessness at rest, drowsy, confused or agitated, unable to speak in full sentences . You have coughing fits . You develop a severe headache or visual changes . You develop shortness of breath, difficulty breathing or start having chest pain . Your symptoms persist after you have completed your treatment plan . If your symptoms do not improve within 10 days  MAKE SURE YOU . Understand these instructions. . Will watch your condition. . Will get help right away if you are  not doing well or get worse.   Your e-visit answers were reviewed by a board certified advanced clinical practitioner to complete your personal care plan, Depending upon the condition, your plan could have included both over the counter or prescription medications.  Please review your pharmacy choice. Your safety is important to [place]. If you have drug allergies check your prescription carefully. You can use MyChart to ask questions about today's visit, request a non-urgent call back, or ask for a work or school excuse for 24 hours related to this e-Visit. If it has been greater than 24 hours you will need to follow up with your provider, or enter a new e-Visit to address those concerns.  You will get an e-mail in the next two days asking about your experience. I hope that your e-visit has been valuable and will speed your recovery. Thank you for using e-visits.  I hope you feel better,    Sharen Heck, PA-C [facility] and [facility]

## 2020-08-17 NOTE — Addendum Note (Signed)
Addended by: Dierdre Forth on: 08/17/2020 05:55 PM   Modules accepted: Orders

## 2020-08-18 ENCOUNTER — Telehealth: Payer: Self-pay

## 2020-08-18 NOTE — Telephone Encounter (Signed)
Ok to send in nebulizer

## 2020-08-18 NOTE — Telephone Encounter (Signed)
Pt had a virtual visit about a cough with a different provider yesterday. They called her in nebulizer solution, but not a nebulizer. She contacted that provider's office and was told they are unable to fax in prescriptions. They redirected her to her pcp. She is asking for a prescription be sent to [facility] on [address] for a Nebulizer.

## 2020-08-18 NOTE — Telephone Encounter (Signed)
Please see message and advise 

## 2020-08-18 NOTE — Progress Notes (Signed)
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 I am happy to send in a prescription for a rescue inhaler. I am unable to do anything else about the nebulizer. Perhaps you can contact your PCP. Let me know if you need a rescue inhaler.

## 2020-08-18 NOTE — Telephone Encounter (Signed)
Left detailed message on personal voicemail Rx for Nebulizer faxed to [facility] on [address]. Any questions please call office.

## 2020-08-28 ENCOUNTER — Ambulatory Visit (INDEPENDENT_AMBULATORY_CARE_PROVIDER_SITE_OTHER): Payer: Self-pay | Admitting: Family Medicine

## 2020-09-07 ENCOUNTER — Ambulatory Visit (INDEPENDENT_AMBULATORY_CARE_PROVIDER_SITE_OTHER): Payer: No Typology Code available for payment source | Admitting: Family Medicine

## 2020-09-07 ENCOUNTER — Other Ambulatory Visit: Payer: Self-pay

## 2020-09-07 ENCOUNTER — Encounter (INDEPENDENT_AMBULATORY_CARE_PROVIDER_SITE_OTHER): Payer: Self-pay | Admitting: Family Medicine

## 2020-09-07 VITALS — BP 110/72 | HR 81 | Temp 97.9°F | Ht 66.0 in | Wt 272.0 lb

## 2020-09-07 DIAGNOSIS — Z6841 Body Mass Index (BMI) 40.0 and over, adult: Secondary | ICD-10-CM | POA: Diagnosis not present

## 2020-09-07 DIAGNOSIS — E559 Vitamin D deficiency, unspecified: Secondary | ICD-10-CM | POA: Diagnosis not present

## 2020-09-07 DIAGNOSIS — Z9189 Other specified personal risk factors, not elsewhere classified: Secondary | ICD-10-CM

## 2020-09-07 DIAGNOSIS — E8881 Metabolic syndrome: Secondary | ICD-10-CM

## 2020-09-07 DIAGNOSIS — E88819 Insulin resistance, unspecified: Secondary | ICD-10-CM

## 2020-09-07 LAB — HM PAP SMEAR: HPV 16/18/45 genotyping: NEGATIVE

## 2020-09-07 MED ORDER — METFORMIN HCL 500 MG PO TABS: 500.0000 mg | ORAL_TABLET | Freq: Two times a day (BID) | ORAL | 0 refills | Status: DC

## 2020-09-07 MED ORDER — VITAMIN D (ERGOCALCIFEROL) 1.25 MG (50000 UNIT) PO CAPS: 50000.0000 [IU] | ORAL_CAPSULE | ORAL | 0 refills | Status: DC

## 2020-09-11 ENCOUNTER — Encounter (INDEPENDENT_AMBULATORY_CARE_PROVIDER_SITE_OTHER): Payer: Self-pay | Admitting: Family Medicine

## 2020-09-11 NOTE — Progress Notes (Signed)
Chief Complaint:   OBESITY Sarah Cardenas is here to discuss her progress with her obesity treatment plan along with follow-up of her obesity related diagnoses. Sarah Cardenas is on practicing portion control and making smarter food choices, such as increasing vegetables and decreasing simple carbohydrates and states she is following her eating plan approximately 75% of the time. Sarah Cardenas states she is doing 0 minutes 0 times per week.  Today's visit was #: 12 Starting weight: 273 lbs Starting date: 02/24/2020 Today's weight: 272 lbs Today's date: 09/07/2020 Total lbs lost to date: 1 Total lbs lost since last in-office visit: 0  Interim History: Sarah Cardenas is up 8 lbs today. She has been on prednisone which has made her more hungry. She is interested in [place]. She may start on Optavia. Her mom is on it as well. Sarah Cardenas eats out at lunch and dinner. She has a shake for breakfast.  Subjective:   1. Vitamin D deficiency Sarah Cardenas Vit D level is almost at goal at 45. She notes fatigue. She is on weekly prescription Vit D.  2. Insulin resistance Sarah Cardenas notes polyphagia. She is tolerating metformin at one tablet daily. Last A1c was 5.3.   Lab Results  Component Value Date   INSULIN 15.7 08/01/2020   INSULIN 12.0 02/24/2020   Lab Results  Component Value Date   HGBA1C 5.3 08/01/2020   3. At risk for side effect of medication Sarah Cardenas is at risk for drug side effects due to increased dose of metformin.  Assessment/Plan:   1. Vitamin D deficiency  We will refill prescription Vitamin D for 1 month.   - Vitamin D, Ergocalciferol, (DRISDOL) 1.25 MG (50000 UNIT) CAPS capsule; Take 1 capsule (50,000 Units total) by mouth every 7 (seven) days.  Dispense: 4 capsule; Refill: 0  2. Insulin resistance  Sarah Cardenas agreed to increase metformin to 500 mg BID with no refills, with meals.  - metFORMIN (GLUCOPHAGE) 500 MG tablet; Take 1 tablet (500 mg total) by mouth 2 (two) times daily with a meal.  Dispense: 60 tablet;  Refill: 0  3. At risk for side effect of medication Sarah Cardenas was given approximately 15 minutes of drug side effect counseling today.  We discussed side effect possibility and risk versus benefits. Sarah Cardenas agreed to the medication and will contact this office if these side effects are intolerable.  Repetitive spaced learning was employed today to elicit superior memory formation and behavioral change.  4. Class 3 severe obesity with serious comorbidity and body mass index (BMI) of 40.0 to 44.9 in adult, unspecified obesity type (HCC) Sarah Cardenas is currently in the action stage of change. As such, her goal is to continue with weight loss efforts. She has agreed to practicing portion control and making smarter food choices, such as increasing vegetables and decreasing simple carbohydrates, with 80 grams of protein daily.   Handout was given today: Holiday Strategies. She may try Optavia.  Exercise goals: No exercise has been prescribed at this time.  Behavioral modification strategies: increasing lean protein intake, decreasing simple carbohydrates, meal planning and cooking strategies and holiday eating strategies .  Sarah Cardenas has agreed to follow-up with our clinic in 3 weeks.   Objective:   Blood pressure 110/72, pulse 81, temperature 97.9 F (36.6 C), height 5\' 6"  (1.676 m), weight 272 lb (123.4 kg), SpO2 95 %. Body mass index is 43.9 kg/m.  General: Cooperative, alert, well developed, in no acute distress. HEENT: Conjunctivae and lids unremarkable. Cardiovascular: Regular rhythm.  Lungs: Normal work of breathing.  Neurologic: No focal deficits.   Lab Results  Component Value Date   CREATININE 0.91 08/01/2020   BUN 8 08/01/2020   NA 139 08/01/2020   K 4.6 08/01/2020   CL 103 08/01/2020   CO2 22 08/01/2020   Lab Results  Component Value Date   ALT 11 08/01/2020   AST 16 08/01/2020   ALKPHOS 93 08/01/2020   BILITOT 0.3 08/01/2020   Lab Results  Component Value Date   HGBA1C 5.3  08/01/2020   HGBA1C 5.2 02/24/2020   Lab Results  Component Value Date   INSULIN 15.7 08/01/2020   INSULIN 12.0 02/24/2020   Lab Results  Component Value Date   TSH 2.350 02/24/2020   Lab Results  Component Value Date   CHOL 241 (H) 08/01/2020   HDL 51 08/01/2020   LDLCALC 163 (H) 08/01/2020   LDLDIRECT 161.0 12/07/2018   TRIG 149 08/01/2020   CHOLHDL 4.4 02/24/2020   Lab Results  Component Value Date   WBC 8.7 08/01/2020   HGB 13.9 08/01/2020   HCT 42.7 08/01/2020   MCV 88 08/01/2020   PLT 240 08/01/2020   Lab Results  Component Value Date   IRON 72 08/01/2020   TIBC 337 08/01/2020   FERRITIN 19 08/01/2020   Attestation Statements:   Reviewed by clinician on day of visit: allergies, medications, problem list, medical history, surgical history, family history, social history, and previous encounter notes.   Trude Mcburney, am acting as Energy manager for [place], FNP-C.  I have reviewed the above documentation for accuracy and completeness, and I agree with the above. -  Jesse Sans, FNP

## 2020-09-13 ENCOUNTER — Telehealth (INDEPENDENT_AMBULATORY_CARE_PROVIDER_SITE_OTHER): Payer: No Typology Code available for payment source | Admitting: Physician Assistant

## 2020-09-13 ENCOUNTER — Encounter: Payer: Self-pay | Admitting: Physician Assistant

## 2020-09-13 VITALS — Ht 66.0 in | Wt 276.0 lb

## 2020-09-13 DIAGNOSIS — R059 Cough, unspecified: Secondary | ICD-10-CM

## 2020-09-13 MED ORDER — ALBUTEROL SULFATE HFA 108 (90 BASE) MCG/ACT IN AERS: 2.0000 | INHALATION_SPRAY | Freq: Four times a day (QID) | RESPIRATORY_TRACT | 0 refills | Status: DC | PRN

## 2020-09-13 MED ORDER — AMOXICILLIN-POT CLAVULANATE 875-125 MG PO TABS: 1.0000 | ORAL_TABLET | Freq: Two times a day (BID) | ORAL | 0 refills | Status: DC

## 2020-09-13 NOTE — Progress Notes (Signed)
Virtual Visit via Video   I connected with Sarah Cardenas on 09/13/20 at 12:00 PM EST by a video enabled telemedicine application and verified that I am speaking with the correct person using two identifiers. Location patient: Home Location provider: [facility], Office Persons participating in the virtual visit: Sarah Cardenas, [name] PA-C, Corky Mull, LPN   I discussed the limitations of evaluation and management by telemedicine and the availability of in person appointments. The patient expressed understanding and agreed to proceed.  I acted as a Neurosurgeon for [employer], PA-C [employer], LPN   Subjective:   HPI:   Patient is requesting evaluation for URI symptoms.  Symptom onset: Yesterday  Travel/contacts: No exposure or travel  Vaccination status: Yes  Tested for COVID at work yesterday and was negative.  Patient endorses the following symptoms: Fever (100.2), sinus congestion, rhinorrhea, ear pain, sore throat, difficulty swallowing, productive cough (expectorating brown sputum) and myalgia  Patient denies the following symptoms: sinus headache, wheezing, shortness of breath, chest tightness and chest pain  Treatments tried: Advil cold and sinus, Symbicort inhaler.  Patient risk factors: Current COVID-19 risk of complications score: 1 Smoking status: Sarah Cardenas  reports that she has been smoking cigarettes. She has a 5.00 pack-year smoking history. She has never used smokeless tobacco. If female, currently pregnant? []   Yes [x]   No  ROS: See pertinent positives and negatives per HPI.  Patient Active Problem List   Diagnosis Date Noted  . Other hyperlipidemia 08/02/2020  . Iron deficiency anemia 08/01/2020  . Insulin resistance 05/01/2020  . Tobacco use disorder 05/01/2020  . Vitamin D deficiency 04/17/2020  . Bipolar 1 disorder (HCC) 04/17/2020  . Class 3 severe obesity with serious comorbidity and body mass index (BMI) of  40.0 to 44.9 in adult (HCC) 12/30/2019  . Bipolar 1 disorder, depressed, severe (HCC) 05/26/2019  . Cervical intraepithelial neoplasia grade 1 12/07/2018    Social History   Tobacco Use  . Smoking status: Current Some Day Smoker    Packs/day: 0.50    Years: 10.00    Pack years: 5.00    Types: Cigarettes  . Smokeless tobacco: Never Used  Substance Use Topics  . Alcohol use: Yes    Comment: occ    Current Outpatient Medications:  .  azelastine (ASTELIN) 0.1 % nasal spray, Place 2 sprays into both nostrils 2 (two) times daily. Use in each nostril as directed, Disp: 30 mL, Rfl: 12 .  budesonide-formoterol (SYMBICORT) 80-4.5 MCG/ACT inhaler, Inhale 2 puffs into the lungs 2 (two) times daily., Disp: 1 Inhaler, Rfl: 1 .  fluticasone (FLONASE) 50 MCG/ACT nasal spray, Place 2 sprays into both nostrils daily., Disp: 16 g, Rfl: 2 .  hydrOXYzine (ATARAX/VISTARIL) 25 MG tablet, hydroxyzine HCl 25 mg tablet  TAKE 1/2 TO 1 TABLET BY MOUTH THREE TIMES DAILY AS NEEDED FOR ANXIETY OR PANIC OR AGITATION OR SLEEP, Disp: , Rfl:  .  ibuprofen (ADVIL,MOTRIN) 200 MG tablet, Take 400 mg by mouth every 6 (six) hours as needed., Disp: , Rfl:  .  lamoTRIgine (LAMICTAL) 200 MG tablet, Take 200 mg by mouth 2 (two) times daily., Disp: , Rfl:  .  metFORMIN (GLUCOPHAGE) 500 MG tablet, Take 1 tablet (500 mg total) by mouth 2 (two) times daily with a meal., Disp: 60 tablet, Rfl: 0 .  prazosin (MINIPRESS) 2 MG capsule, TK 2 CS PO QHS, Disp: , Rfl:  .  sertraline (ZOLOFT) 100 MG tablet, TK 1 T PO  QAM, Disp: , Rfl:  .  Vitamin D, Ergocalciferol, (DRISDOL) 1.25 MG (50000 UNIT) CAPS capsule, Take 1 capsule (50,000 Units total) by mouth every 7 (seven) days., Disp: 4 capsule, Rfl: 0 .  VRAYLAR capsule, Take 1.5 mg by mouth every morning., Disp: , Rfl:  .  albuterol (VENTOLIN HFA) 108 (90 Base) MCG/ACT inhaler, Inhale 2 puffs into the lungs every 6 (six) hours as needed for wheezing or shortness of breath., Disp: 8 g, Rfl: 0 .   amoxicillin-clavulanate (AUGMENTIN) 875-125 MG tablet, Take 1 tablet by mouth 2 (two) times daily., Disp: 20 tablet, Rfl: 0  Allergies  Allergen Reactions  . Ciprofloxacin Other (See Comments)    numbness    Objective:   VITALS: Per patient if applicable, see vitals. GENERAL: Alert, appears well and in no acute distress. HEENT: Atraumatic, conjunctiva clear, no obvious abnormalities on inspection of external nose and ears. NECK: Normal movements of the head and neck. CARDIOPULMONARY: No increased WOB. Speaking in clear sentences. I:E ratio WNL. Productive cough. MS: Moves all visible extremities without noticeable abnormality. PSYCH: Pleasant and cooperative, well-groomed. Speech normal rate and rhythm. Affect is appropriate. Insight and judgement are appropriate. Attention is focused, linear, and appropriate.  NEURO: CN grossly intact. Oriented as arrived to appointment on time with no prompting. Moves both UE equally.  SKIN: No obvious lesions, wounds, erythema, or cyanosis noted on face or hands.  Assessment and Plan:   Sarah Cardenas was seen today for covic symptoms.  Diagnoses and all orders for this visit:  Cough  Other orders -     amoxicillin-clavulanate (AUGMENTIN) 875-125 MG tablet; Take 1 tablet by mouth 2 (two) times daily. -     albuterol (VENTOLIN HFA) 108 (90 Base) MCG/ACT inhaler; Inhale 2 puffs into the lungs every 6 (six) hours as needed for wheezing or shortness of breath.   No red flags on discussion, patient is not in any obvious distress during our visit.  Significant productive cough during encounter. Given smoking history, will trial oral Augmentin to cover for possible strep/PNA.   Discussed over the counter supportive care options, with recommendations to push fluids and rest. Reviewed return precautions including new/worsening fever, SOB, new/worsening cough or other concerns.  Recommended need to self-quarantine and practice social distancing until symptoms  resolve.  I recommend that patient follow-up if symptoms worsen or persist despite treatment x 7-10 days, sooner if needed.  I discussed the assessment and treatment plan with the patient. The patient was provided an opportunity to ask questions and all were answered. The patient agreed with the plan and demonstrated an understanding of the instructions.   The patient was advised to call back or seek an in-person evaluation if the symptoms worsen or if the condition fails to improve as anticipated.   CMA or LPN served as scribe during this visit. History, Physical, and Plan performed by medical provider. The above documentation has been reviewed and is accurate and complete.   [place], [place] 09/13/2020

## 2020-09-26 ENCOUNTER — Ambulatory Visit (INDEPENDENT_AMBULATORY_CARE_PROVIDER_SITE_OTHER): Payer: No Typology Code available for payment source | Admitting: Family Medicine

## 2020-09-27 ENCOUNTER — Ambulatory Visit (INDEPENDENT_AMBULATORY_CARE_PROVIDER_SITE_OTHER): Payer: No Typology Code available for payment source | Admitting: Family Medicine

## 2020-10-19 ENCOUNTER — Ambulatory Visit: Payer: No Typology Code available for payment source | Admitting: Podiatry

## 2020-10-24 ENCOUNTER — Telehealth (INDEPENDENT_AMBULATORY_CARE_PROVIDER_SITE_OTHER): Payer: No Typology Code available for payment source | Admitting: Family Medicine

## 2020-10-24 ENCOUNTER — Encounter: Payer: Self-pay | Admitting: Physician Assistant

## 2020-10-24 DIAGNOSIS — E88819 Insulin resistance, unspecified: Secondary | ICD-10-CM

## 2020-10-24 DIAGNOSIS — E8881 Metabolic syndrome: Secondary | ICD-10-CM | POA: Diagnosis not present

## 2020-10-24 DIAGNOSIS — E559 Vitamin D deficiency, unspecified: Secondary | ICD-10-CM

## 2020-10-24 DIAGNOSIS — Z6841 Body Mass Index (BMI) 40.0 and over, adult: Secondary | ICD-10-CM

## 2020-10-24 MED ORDER — VITAMIN D (ERGOCALCIFEROL) 1.25 MG (50000 UNIT) PO CAPS: 50000.0000 [IU] | ORAL_CAPSULE | ORAL | 0 refills | Status: DC

## 2020-10-24 MED ORDER — METFORMIN HCL 500 MG PO TABS: 500.0000 mg | ORAL_TABLET | Freq: Two times a day (BID) | ORAL | 0 refills | Status: DC

## 2020-10-31 ENCOUNTER — Encounter (INDEPENDENT_AMBULATORY_CARE_PROVIDER_SITE_OTHER): Payer: Self-pay | Admitting: Family Medicine

## 2020-10-31 NOTE — Progress Notes (Signed)
TeleHealth Visit:  Due to the COVID-19 pandemic, this visit was completed with telemedicine (audio/video) technology to reduce patient and provider exposure as well as to preserve personal protective equipment.   Sarah Cardenas has verbally consented to this TeleHealth visit. The patient is located at home, the provider is located at the [facility] office. The participants in this visit include the listed provider and patient. Sarah Cardenas was unable to use realtime audiovisual technology today and the telehealth visit was conducted via telephone.   Chief Complaint: OBESITY Sarah Cardenas is here to discuss her progress with her obesity treatment plan along with follow-up of her obesity related diagnoses. [name] is on practicing portion control and making smarter food choices, such as increasing vegetables and decreasing simple carbohydrates and states she is following her eating plan approximately 100% of the time. [name] states she is walking for 20 minutes 2 times per week.  Today's visit was #: 13 Starting weight: 273 lbs Starting date: 02/24/2020  Interim History: Sarah Cardenas is positive for COVID, so we are doing a phone call visit (15 minutes). We did attempt vidoe but were unable to make the video work.  She started Optavia 2 days ago and she likes it. She is having 5 "fuelings" and 1 meal. Her hunger is satisfied. Her fuelings are about 500-600 calories daily.  Subjective:   1. Insulin resistance Malesha has a diagnosis of insulin resistance based on her elevated fasting insulin level >5. Talayah has been off metformin because she ran out. Her dose increased to BID at her last office visit and she is tolerating it well. She denies polyphagia.   Lab Results  Component Value Date   INSULIN 15.7 08/01/2020   INSULIN 12.0 02/24/2020   Lab Results  Component Value Date   HGBA1C 5.3 08/01/2020   2. Vitamin D deficiency Sarah Cardenas's Vit D level is nearly at goal at 45.1. She is on weekly prescription Vit  D.  Assessment/Plan:   1. Insulin resistance Corry will continue to work on weight loss, exercise, and decreasing simple carbohydrates to help decrease the risk of diabetes. We will refill metformin for 1 month. Dejanique agreed to follow-up with [place] as directed to closely monitor her progress.  - metFORMIN (GLUCOPHAGE) 500 MG tablet; Take 1 tablet (500 mg total) by mouth 2 (two) times daily with a meal.  Dispense: 60 tablet; Refill: 0  2. Vitamin D deficiency  We will refill prescription Vitamin D for 1 month. Sarah Cardenas will follow-up for routine testing of Vitamin D, at least 2-3 times per year to avoid over-replacement.  - Vitamin D, Ergocalciferol, (DRISDOL) 1.25 MG (50000 UNIT) CAPS capsule; Take 1 capsule (50,000 Units total) by mouth every 7 (seven) days.  Dispense: 4 capsule; Refill: 0  3. Class 3 severe obesity with serious comorbidity and body mass index (BMI) of 40.0 to 44.9 in adult, unspecified obesity type (HCC) Leaner is currently in the action stage of change. As such, her goal is to continue with weight loss efforts. She has agreed to practicing portion control and making smarter food choices, such as increasing vegetables and decreasing simple carbohydrates with 80 grams of protein.   Exercise goals: As is.  Behavioral modification strategies: increasing lean protein intake, increasing vegetables and increasing water intake.  Sarah Cardenas has agreed to follow-up with our clinic in 3 weeks. She was informed of the importance of frequent follow-up visits to maximize her su Objective:   VITALS: Per patient if applicable, see vitals. GENERAL: Alert and  in no acute distress. CARDIOPULMONARY: No increased WOB. Speaking in clear sentences.  PSYCH: Pleasant and cooperative. Speech normal rate and rhythm. Affect is appropriate. Insight and judgement are appropriate. Attention is focused, linear, and appropriate.  NEURO: Oriented as arrived to appointment on time with no prompting.   Lab Results   Component Value Date   CREATININE 0.91 08/01/2020   BUN 8 08/01/2020   NA 139 08/01/2020   K 4.6 08/01/2020   CL 103 08/01/2020   CO2 22 08/01/2020   Lab Results  Component Value Date   ALT 11 08/01/2020   AST 16 08/01/2020   ALKPHOS 93 08/01/2020   BILITOT 0.3 08/01/2020   Lab Results  Component Value Date   HGBA1C 5.3 08/01/2020   HGBA1C 5.2 02/24/2020   Lab Results  Component Value Date   INSULIN 15.7 08/01/2020   INSULIN 12.0 02/24/2020   Lab Results  Component Value Date   TSH 2.350 02/24/2020   Lab Results  Component Value Date   CHOL 241 (H) 08/01/2020   HDL 51 08/01/2020   LDLCALC 163 (H) 08/01/2020   LDLDIRECT 161.0 12/07/2018   TRIG 149 08/01/2020   CHOLHDL 4.4 02/24/2020   Lab Results  Component Value Date   WBC 8.7 08/01/2020   HGB 13.9 08/01/2020   HCT 42.7 08/01/2020   MCV 88 08/01/2020   PLT 240 08/01/2020   Lab Results  Component Value Date   IRON 72 08/01/2020   TIBC 337 08/01/2020   FERRITIN 19 08/01/2020    Attestation Statements:   Reviewed by clinician on day of visit: allergies, medications, problem list, medical history, surgical history, family history, social history, and previous encounter notes.   Trude Mcburney, am acting as Energy manager for [place], FNP-C.  I have reviewed the above documentation for accuracy and completeness, and I agree with the above. - Jesse Sans, FNP

## 2020-11-16 ENCOUNTER — Encounter (INDEPENDENT_AMBULATORY_CARE_PROVIDER_SITE_OTHER): Payer: Self-pay | Admitting: Family Medicine

## 2020-11-16 ENCOUNTER — Other Ambulatory Visit: Payer: Self-pay

## 2020-11-16 ENCOUNTER — Ambulatory Visit (INDEPENDENT_AMBULATORY_CARE_PROVIDER_SITE_OTHER): Payer: No Typology Code available for payment source | Admitting: Family Medicine

## 2020-11-16 VITALS — BP 119/77 | HR 95 | Temp 97.9°F | Ht 66.0 in | Wt 265.0 lb

## 2020-11-16 DIAGNOSIS — Z9189 Other specified personal risk factors, not elsewhere classified: Secondary | ICD-10-CM

## 2020-11-16 DIAGNOSIS — E559 Vitamin D deficiency, unspecified: Secondary | ICD-10-CM

## 2020-11-16 DIAGNOSIS — E88819 Insulin resistance, unspecified: Secondary | ICD-10-CM

## 2020-11-16 DIAGNOSIS — Z6841 Body Mass Index (BMI) 40.0 and over, adult: Secondary | ICD-10-CM

## 2020-11-16 DIAGNOSIS — F319 Bipolar disorder, unspecified: Secondary | ICD-10-CM

## 2020-11-16 DIAGNOSIS — E8881 Metabolic syndrome: Secondary | ICD-10-CM

## 2020-11-16 MED ORDER — VITAMIN D (ERGOCALCIFEROL) 1.25 MG (50000 UNIT) PO CAPS: 50000.0000 [IU] | ORAL_CAPSULE | ORAL | 0 refills | Status: DC

## 2020-11-16 MED ORDER — METFORMIN HCL 500 MG PO TABS: 500.0000 mg | ORAL_TABLET | Freq: Two times a day (BID) | ORAL | 0 refills | Status: DC

## 2020-11-20 ENCOUNTER — Encounter (INDEPENDENT_AMBULATORY_CARE_PROVIDER_SITE_OTHER): Payer: Self-pay | Admitting: Family Medicine

## 2020-11-20 NOTE — Progress Notes (Signed)
Chief Complaint:   OBESITY Sarah Cardenas is here to discuss her progress with her obesity treatment plan along with follow-up of her obesity related diagnoses. Sarah Cardenas is on practicing portion control and making smarter food choices, such as increasing vegetables and decreasing simple carbohydrates and states she is following her eating plan approximately 0% of the time. Sarah Cardenas states she is walks for 20-30 minutes 2 times per week.  Today's visit was #: 14 Starting weight: 273 lbs Starting date: 02/24/2020 Today's weight: 265 lbs Today's date: 11/16/2020 Total lbs lost to date: 8 Total lbs lost since last in-office visit: 7  Interim History: Sarah Cardenas is doing the [employer]. She reports having 8-12 oz of meat at supper. Her water intake is good at 64 oz per day. Her hunger is satisfied. She had a recent case of COVID, but she had no symptoms. She is happy with doing the [employer].  Subjective:   1. Insulin resistance Evonna is on metformin. She is on psychotropic medications that can increase insulin resistance.   Lab Results  Component Value Date   INSULIN 15.7 08/01/2020   INSULIN 12.0 02/24/2020   Lab Results  Component Value Date   HGBA1C 5.3 08/01/2020   2. Vitamin D deficiency Sarah Cardenas's last Vit D was low at 45.1. She is on prescription Vit D.  3. Bipolar 1 disorder (HCC) Sarah Cardenas reports her mood is "ok" but admits it could be better. Her psych provider is working on adjusting her medications. She is currently taking Vraylar. hydroxyzine (anxiety), lamotrigine, and prazosin.  4. At risk for impaired metabolic function Sarah Cardenas is at increased risk for impaired metabolic function due to following a low calorie diet ([employer] plan).  Assessment/Plan:   1. Insulin resistance We will refill metformin for 1 month. - metFORMIN (GLUCOPHAGE) 500 MG tablet; Take 1 tablet (500 mg total) by mouth 2 (two) times daily with a meal.  Dispense: 60 tablet; Refill: 0  2. Vitamin D deficiency . We  will refill prescription Vitamin D for 1 month. Sarah Cardenas will follow-up for routine testing of Vitamin D, at least 2-3 times per year to avoid over-replacement.   - Vitamin D, Ergocalciferol, (DRISDOL) 1.25 MG (50000 UNIT) CAPS capsule; Take 1 capsule (50,000 Units total) by mouth every 7 (seven) days.  Dispense: 4 capsule; Refill: 0  3. Bipolar 1 disorder (HCC) [name] will continue her medications, and will follow up with her Psychiatrist as directed.  4. At risk for impaired metabolic function [name] was given approximately 15 minutes of impaired  metabolic function prevention counseling today. We discussed intensive lifestyle modifications today with an emphasis on specific nutrition and exercise instructions and strategies.   Repetitive spaced learning was employed today to elicit superior memory formation and behavioral change.  5. Class 3 severe obesity with serious comorbidity and body mass index (BMI) of 40.0 to 44.9 in adult, unspecified obesity type (HCC) Sarah Cardenas is currently in the action stage of change. As such, her goal is to continue with weight loss efforts. She has agreed to practicing portion control and making smarter food choices, such as increasing vegetables and decreasing simple carbohydrates/[employer] plan.   Exercise goals: Increase walking to 3 times per week.  Behavioral modification strategies: planning for success.  Sarah Cardenas has agreed to follow-up with our clinic in 3 weeks.   Objective:   Blood pressure 119/77, pulse 95, temperature 97.9 F (36.6 C), height 5\' 6"  (1.676 m), weight 265 lb (120.2 kg), SpO2 99 %. Body mass index is  42.77 kg/m.  General: Cooperative, alert, well developed, in no acute distress. HEENT: Conjunctivae and lids unremarkable. Cardiovascular: Regular rhythm.  Lungs: Normal work of breathing. Neurologic: No focal deficits.   Lab Results  Component Value Date   CREATININE 0.91 08/01/2020   BUN 8 08/01/2020   NA 139 08/01/2020   K 4.6  08/01/2020   CL 103 08/01/2020   CO2 22 08/01/2020   Lab Results  Component Value Date   ALT 11 08/01/2020   AST 16 08/01/2020   ALKPHOS 93 08/01/2020   BILITOT 0.3 08/01/2020   Lab Results  Component Value Date   HGBA1C 5.3 08/01/2020   HGBA1C 5.2 02/24/2020   Lab Results  Component Value Date   INSULIN 15.7 08/01/2020   INSULIN 12.0 02/24/2020   Lab Results  Component Value Date   TSH 2.350 02/24/2020   Lab Results  Component Value Date   CHOL 241 (H) 08/01/2020   HDL 51 08/01/2020   LDLCALC 163 (H) 08/01/2020   LDLDIRECT 161.0 12/07/2018   TRIG 149 08/01/2020   CHOLHDL 4.4 02/24/2020   Lab Results  Component Value Date   WBC 8.7 08/01/2020   HGB 13.9 08/01/2020   HCT 42.7 08/01/2020   MCV 88 08/01/2020   PLT 240 08/01/2020   Lab Results  Component Value Date   IRON 72 08/01/2020   TIBC 337 08/01/2020   FERRITIN 19 08/01/2020   Attestation Statements:   Reviewed by clinician on day of visit: allergies, medications, problem list, medical history, surgical history, family history, social history, and previous encounter notes.   Trude Mcburney, am acting as Energy manager for [place], FNP-C.  I have reviewed the above documentation for accuracy and completeness, and I agree with the above. -  Jesse Sans, FNP

## 2020-11-24 ENCOUNTER — Ambulatory Visit (INDEPENDENT_AMBULATORY_CARE_PROVIDER_SITE_OTHER): Payer: No Typology Code available for payment source

## 2020-11-24 ENCOUNTER — Ambulatory Visit: Payer: No Typology Code available for payment source | Admitting: Family Medicine

## 2020-11-24 ENCOUNTER — Other Ambulatory Visit: Payer: Self-pay

## 2020-11-24 VITALS — BP 140/90 | HR 78 | Ht 66.0 in | Wt 272.4 lb

## 2020-11-24 DIAGNOSIS — M62838 Other muscle spasm: Secondary | ICD-10-CM

## 2020-11-24 MED ORDER — TIZANIDINE HCL 4 MG PO TABS: 4.0000 mg | ORAL_TABLET | Freq: Four times a day (QID) | ORAL | 1 refills | Status: DC | PRN

## 2020-11-24 MED ORDER — HYDROCODONE-ACETAMINOPHEN 5-325 MG PO TABS: 1.0000 | ORAL_TABLET | Freq: Four times a day (QID) | ORAL | 0 refills | Status: DC | PRN

## 2020-11-24 MED ORDER — PREDNISONE 50 MG PO TABS: 50.0000 mg | ORAL_TABLET | Freq: Every day | ORAL | 0 refills | Status: DC

## 2020-11-24 NOTE — Patient Instructions (Signed)
Thank you for coming in today.  Please get an Xray today before you leave  I've referred you to Physical Therapy.  Let us know if you don't hear from them in one week.  Use heating pad and TENS unit.   Use muscle relaxer as needed mostly at bedtime.   Use hydrocodone sparingly  Use with laxitive.   Let me know if not improving.   TENS UNIT: This is helpful for muscle pain and spasm.   Search and Purchase a TENS 7000 2nd edition at  www.tenspros.com or www.Amazon.com It should be less than $30.     TENS unit instructions: Do not shower or bathe with the unit on . Turn the unit off before removing electrodes or batteries . If the electrodes lose stickiness add a drop of water to the electrodes after they are disconnected from the unit and place on plastic sheet. If you continued to have difficulty, call the TENS unit company to purchase more electrodes. . Do not apply lotion on the skin area prior to use. Make sure the skin is clean and dry as this will help prolong the life of the electrodes. . After use, always check skin for unusual red areas, rash or other skin difficulties. If there are any skin problems, does not apply electrodes to the same area. . Never remove the electrodes from the unit by pulling the wires. . Do not use the TENS unit or electrodes other than as directed. . Do not change electrode placement without consultating your therapist or physician. Marland Kitchen Keep 2 fingers with between each electrode. . Wear time ratio is 2:1, on to off times.    For example on for 30 minutes off for 15 minutes and then on for 30 minutes off for 15 minutes     Cervical Strain and Sprain Rehab Ask your health care provider which exercises are safe for you. Do exercises exactly as told by your health care provider and adjust them as directed. It is normal to feel mild stretching, pulling, tightness, or discomfort as you do these exercises. Stop right away if you feel sudden pain or your  pain gets worse. Do not begin these exercises until told by your health care provider. Stretching and range-of-motion exercises Cervical side bending 1. Using good posture, sit on a stable chair or stand up. 2. Without moving your shoulders, slowly tilt your left / right ear to your shoulder until you feel a stretch in the opposite side neck muscles. You should be looking straight ahead. 3. Hold for __________ seconds. 4. Repeat with the other side of your neck. Repeat __________ times. Complete this exercise __________ times a day.   Cervical rotation 1. Using good posture, sit on a stable chair or stand up. 2. Slowly turn your head to the side as if you are looking over your left / right shoulder. ? Keep your eyes level with the ground. ? Stop when you feel a stretch along the side and the back of your neck. 3. Hold for __________ seconds. 4. Repeat this by turning to your other side. Repeat __________ times. Complete this exercise __________ times a day.   Thoracic extension and pectoral stretch 1. Roll a towel or a small blanket so it is about 4 inches (10 cm) in diameter. 2. Lie down on your back on a firm surface. 3. Put the towel lengthwise, under your spine in the middle of your back. It should not be under your shoulder blades. The towel  should line up with your spine from your middle back to your lower back. 4. Put your hands behind your head and let your elbows fall out to your sides. 5. Hold for __________ seconds. Repeat __________ times. Complete this exercise __________ times a day. Strengthening exercises Isometric upper cervical flexion 1. Lie on your back with a thin pillow behind your head and a small rolled-up towel under your neck. 2. Gently tuck your chin toward your chest and nod your head down to look toward your feet. Do not lift your head off the pillow. 3. Hold for __________ seconds. 4. Release the tension slowly. Relax your neck muscles completely before you  repeat this exercise. Repeat __________ times. Complete this exercise __________ times a day. Isometric cervical extension 1. Stand about 6 inches (15 cm) away from a wall, with your back facing the wall. 2. Place a soft object, about 6-8 inches (15-20 cm) in diameter, between the back of your head and the wall. A soft object could be a small pillow, a ball, or a folded towel. 3. Gently tilt your head back and press into the soft object. Keep your jaw and forehead relaxed. 4. Hold for __________ seconds. 5. Release the tension slowly. Relax your neck muscles completely before you repeat this exercise. Repeat __________ times. Complete this exercise __________ times a day.   Posture and body mechanics Body mechanics refers to the movements and positions of your body while you do your daily activities. Posture is part of body mechanics. Good posture and healthy body mechanics can help to relieve stress in your body's tissues and joints. Good posture means that your spine is in its natural S-curve position (your spine is neutral), your shoulders are pulled back slightly, and your head is not tipped forward. The following are general guidelines for applying improved posture and body mechanics to your everyday activities. Sitting 1. When sitting, keep your spine neutral and keep your feet flat on the floor. Use a footrest, if necessary, and keep your thighs parallel to the floor. Avoid rounding your shoulders, and avoid tilting your head forward. 2. When working at a desk or a computer, keep your desk at a height where your hands are slightly lower than your elbows. Slide your chair under your desk so you are close enough to maintain good posture. 3. When working at a computer, place your monitor at a height where you are looking straight ahead and you do not have to tilt your head forward or downward to look at the screen.   Standing  When standing, keep your spine neutral and keep your feet about  hip-width apart. Keep a slight bend in your knees. Your ears, shoulders, and hips should line up.  When you do a task in which you stand in one place for a long time, place one foot up on a stable object that is 2-4 inches (5-10 cm) high, such as a footstool. This helps keep your spine neutral.   Resting When lying down and resting, avoid positions that are most painful for you. Try to support your neck in a neutral position. You can use a contour pillow or a small rolled-up towel. Your pillow should support your neck but not push on it. This information is not intended to replace advice given to you by your health care provider. Make sure you discuss any questions you have with your health care provider. Document Revised: 01/27/2019 Document Reviewed: 07/08/2018 Elsevier Patient Education  2021 [employer].

## 2020-11-24 NOTE — Progress Notes (Signed)
I, Christoper Fabian, LAT, ATC, am serving as scribe for Dr. Clementeen Graham.  Sarah Cardenas is a 35 y.o. female who presents to [facility] today for neck/shoulder pain.  She was last seen by Dr. Denyse Amass on 01/26/20 for LBP w/ B ant-lat thigh pain.  Since then, pt reports neck and shoulder pain that's been ongoing since Wednesday, 11/22/20. Pt went to chiropracter yesterday, w/ no relief. MOI: Pt reports she may have slept wrong on her neck, but was unsure.  She locates her pain to L-side c-spine, into scapula, and down L shoulder. Pt is guarding and holding L arm against her body in a flexed position.  Radiating pain: yes UE numbness/tingling: yes UE weakness: no  Aggravating factors: any movement, cervical rotation Treatments tried: motrin, ice   Pertinent review of systems: No fevers or chills  Relevant historical information: Bipolar disorder, insulin resistance, obesity   Exam:  BP 140/90 (BP Location: Right Arm, Patient Position: Sitting, Cuff Size: Normal)   Pulse 78   Ht 5\' 6"  (1.676 m)   Wt 272 lb 6.4 oz (123.6 kg)   SpO2 98%   BMI 43.97 kg/m  General: Well Developed, well nourished, and in no acute distress.   MSK: C-spine normal-appearing Nontender midline.  Tender palpation left cervical paraspinal musculature and trapezius. Very limited cervical motion. Especially limited to left lateral flexion and rotation. Left shoulder normal-appearing Nontender. Painful external rotation and abduction however motion is intact. Within limits of motion and strength intact abduction external rotation and internal rotation. Positive Hawkins and Neer's test. Negative Yergason's and speeds test. Pulses cap refill and sensation are intact distally bilateral upper extremities. Reflexes intact bilateral upper extremities.  X-ray images cervical spine obtained today personally and independently interpreted Loss of cervical lordosis indicating spasm.  No severe  degenerative changes.  No fractures. Await formal radiology review   Assessment and Plan: 35 y.o. female with 2 days of significant left-sided cervical neck pain into the left shoulder girdle and upper arm.  Thought to be mostly related to muscle spasm and dysfunction.  I do not think there is much radiculopathy or actual shoulder rotator cuff dysfunction.  Plan to treat with physical therapy heating pad home exercise program and tizanidine.  Limited hydrocodone for severe pain.  I have prescribed prednisone as a backup as there may be some radiculopathy here.  Work note provided.  Recheck if not improving.   PDMP reviewed during this encounter. Orders Placed This Encounter  Procedures  . DG Cervical Spine 2 or 3 views    Standing Status:   Future    Number of Occurrences:   1    Standing Expiration Date:   11/24/2021    Order Specific Question:   Reason for Exam (SYMPTOM  OR DIAGNOSIS REQUIRED)    Answer:   eval left neck pain    Order Specific Question:   Is patient pregnant?    Answer:   No    Order Specific Question:   Preferred imaging location?    Answer:   01/22/2022  . Ambulatory referral to Physical Therapy    Referral Priority:   Routine    Referral Type:   Physical Medicine    Referral Reason:   Specialty Services Required    Requested Specialty:   Physical Therapy   Meds ordered this encounter  Medications  . tiZANidine (ZANAFLEX) 4 MG tablet    Sig: Take 1 tablet (4 mg total) by  mouth every 6 (six) hours as needed for muscle spasms.    Dispense:  30 tablet    Refill:  1  . HYDROcodone-acetaminophen (NORCO/VICODIN) 5-325 MG tablet    Sig: Take 1 tablet by mouth every 6 (six) hours as needed.    Dispense:  15 tablet    Refill:  0  . predniSONE (DELTASONE) 50 MG tablet    Sig: Take 1 tablet (50 mg total) by mouth daily.    Dispense:  5 tablet    Refill:  0     Discussed warning signs or symptoms. Please see discharge instructions. Patient expresses  understanding.   The above documentation has been reviewed and is accurate and complete Clementeen Graham, M.D.

## 2020-11-27 NOTE — Progress Notes (Signed)
X-ray cervical spine shows mild arthritis changes.

## 2020-12-07 ENCOUNTER — Other Ambulatory Visit: Payer: Self-pay

## 2020-12-07 ENCOUNTER — Ambulatory Visit (INDEPENDENT_AMBULATORY_CARE_PROVIDER_SITE_OTHER): Payer: No Typology Code available for payment source | Admitting: Family Medicine

## 2020-12-07 ENCOUNTER — Encounter (INDEPENDENT_AMBULATORY_CARE_PROVIDER_SITE_OTHER): Payer: Self-pay | Admitting: Family Medicine

## 2020-12-07 VITALS — BP 124/81 | HR 97 | Temp 98.2°F | Ht 66.0 in | Wt 266.0 lb

## 2020-12-07 DIAGNOSIS — Z6841 Body Mass Index (BMI) 40.0 and over, adult: Secondary | ICD-10-CM | POA: Diagnosis not present

## 2020-12-07 DIAGNOSIS — E559 Vitamin D deficiency, unspecified: Secondary | ICD-10-CM | POA: Diagnosis not present

## 2020-12-07 DIAGNOSIS — E8881 Metabolic syndrome: Secondary | ICD-10-CM

## 2020-12-07 DIAGNOSIS — E88819 Insulin resistance, unspecified: Secondary | ICD-10-CM

## 2020-12-07 MED ORDER — VITAMIN D (ERGOCALCIFEROL) 1.25 MG (50000 UNIT) PO CAPS: 50000.0000 [IU] | ORAL_CAPSULE | ORAL | 0 refills | Status: DC

## 2020-12-07 MED ORDER — METFORMIN HCL 500 MG PO TABS: 500.0000 mg | ORAL_TABLET | Freq: Two times a day (BID) | ORAL | 0 refills | Status: DC

## 2020-12-11 NOTE — Progress Notes (Signed)
Chief Complaint:   OBESITY Sarah Cardenas is here to discuss her progress with her obesity treatment plan along with follow-up of her obesity related diagnoses. Sarah Cardenas is on practicing portion control and making smarter food choices, such as increasing vegetables and decreasing simple carbohydrates and states she is following her eating plan approximately 100% of the time. Sarah Cardenas states she is not exercising regularly at this time.  Today's visit was #: 15 Starting weight: 273 lbs Starting date: 02/24/2020 Today's weight: 266 lbs Today's date: 12/07/2020 Total lbs lost to date: 7 lbs Total lbs lost since last in-office visit: +1  Interim History: Sarah Cardenas is on [employer] - 5 "Fuelings" and one meal per day of meat and vegetables.  She notes she has been off plan over half of the time.  She has not felt like walking recently.  Her mood has been somewhat depressed.  Subjective:   1. Insulin resistance She is on metformin twice daily.  She is unsure if it helps with her appetite. However, I believe it is beneficial to counteract insulin resistance caused by psychotropic meds.   Lab Results  Component Value Date   INSULIN 15.7 08/01/2020   INSULIN 12.0 02/24/2020   Lab Results  Component Value Date   HGBA1C 5.3 08/01/2020   2. Vitamin D deficiency Vitamin D is slightly low at 45.  She is on weekly prescription vitamin D.  Assessment/Plan:   1. Insulin resistance Will refill metformin today.  Prescription sent to pharmacy.  - Refill metFORMIN (GLUCOPHAGE) 500 MG tablet; Take 1 tablet (500 mg total) by mouth 2 (two) times daily with a meal.  Dispense: 60 tablet; Refill: 0  2. Vitamin D deficiency A refill for prescription vitamin D has been sent to the patient's pharmacy.  - Refill Vitamin D, Ergocalciferol, (DRISDOL) 1.25 MG (50000 UNIT) CAPS capsule; Take 1 capsule (50,000 Units total) by mouth every 7 (seven) days.  Dispense: 4 capsule; Refill: 0  3. Class 3 severe obesity with serious  comorbidity and body mass index (BMI) of 40.0 to 44.9 in adult, unspecified obesity type (HCC)  Sarah Cardenas is currently in the action stage of change. As such, her goal is to continue with weight loss efforts. She has agreed to practicing portion control and making smarter food choices, such as increasing vegetables and decreasing simple carbohydrates.   Plan for one "cheat" meal per week.  Other dinners should be strictly meat and vegetables as directed on [employer] plan.  Exercise goals: Walk 2 times per week.  Behavioral modification strategies: decreasing simple carbohydrates and planning for success.  Sarah Cardenas has agreed to follow-up with our clinic in 3 weeks. She was informed of the importance of frequent follow-up visits to maximize her success with intensive lifestyle modifications for her multiple health conditions.   Objective:   Blood pressure 124/81, pulse 97, temperature 98.2 F (36.8 C), height 5\' 6"  (1.676 m), weight 266 lb (120.7 kg), SpO2 96 %. Body mass index is 42.93 kg/m.  General: Cooperative, alert, well developed, in no acute distress. HEENT: Conjunctivae and lids unremarkable. Cardiovascular: Regular rhythm.  Lungs: Normal work of breathing. Neurologic: No focal deficits.   Lab Results  Component Value Date   CREATININE 0.91 08/01/2020   BUN 8 08/01/2020   NA 139 08/01/2020   K 4.6 08/01/2020   CL 103 08/01/2020   CO2 22 08/01/2020   Lab Results  Component Value Date   ALT 11 08/01/2020   AST 16 08/01/2020   ALKPHOS 93  08/01/2020   BILITOT 0.3 08/01/2020   Lab Results  Component Value Date   HGBA1C 5.3 08/01/2020   HGBA1C 5.2 02/24/2020   Lab Results  Component Value Date   INSULIN 15.7 08/01/2020   INSULIN 12.0 02/24/2020   Lab Results  Component Value Date   TSH 2.350 02/24/2020   Lab Results  Component Value Date   CHOL 241 (H) 08/01/2020   HDL 51 08/01/2020   LDLCALC 163 (H) 08/01/2020   LDLDIRECT 161.0 12/07/2018   TRIG 149 08/01/2020    CHOLHDL 4.4 02/24/2020   Lab Results  Component Value Date   WBC 8.7 08/01/2020   HGB 13.9 08/01/2020   HCT 42.7 08/01/2020   MCV 88 08/01/2020   PLT 240 08/01/2020   Lab Results  Component Value Date   IRON 72 08/01/2020   TIBC 337 08/01/2020   FERRITIN 19 08/01/2020   Attestation Statements:   Reviewed by clinician on day of visit: allergies, medications, problem list, medical history, surgical history, family history, social history, and previous encounter notes.  I, Insurance claims handler, CMA, am acting as Energy manager for [place], FNP.  I have reviewed the above documentation for accuracy and completeness, and I agree with the above. -  Jesse Sans, FNP

## 2020-12-12 ENCOUNTER — Encounter (INDEPENDENT_AMBULATORY_CARE_PROVIDER_SITE_OTHER): Payer: Self-pay | Admitting: Family Medicine

## 2020-12-21 ENCOUNTER — Other Ambulatory Visit: Payer: Self-pay

## 2020-12-21 ENCOUNTER — Ambulatory Visit (INDEPENDENT_AMBULATORY_CARE_PROVIDER_SITE_OTHER): Payer: No Typology Code available for payment source | Admitting: Bariatrics

## 2020-12-21 ENCOUNTER — Encounter (INDEPENDENT_AMBULATORY_CARE_PROVIDER_SITE_OTHER): Payer: Self-pay | Admitting: Bariatrics

## 2020-12-21 VITALS — BP 118/79 | HR 88 | Temp 97.7°F | Ht 66.0 in | Wt 269.0 lb

## 2020-12-21 DIAGNOSIS — E66813 Obesity, class 3: Secondary | ICD-10-CM

## 2020-12-21 DIAGNOSIS — Z6841 Body Mass Index (BMI) 40.0 and over, adult: Secondary | ICD-10-CM

## 2020-12-21 DIAGNOSIS — E8881 Metabolic syndrome: Secondary | ICD-10-CM | POA: Diagnosis not present

## 2020-12-21 DIAGNOSIS — E7849 Other hyperlipidemia: Secondary | ICD-10-CM

## 2020-12-21 DIAGNOSIS — E88819 Insulin resistance, unspecified: Secondary | ICD-10-CM

## 2020-12-25 ENCOUNTER — Encounter (INDEPENDENT_AMBULATORY_CARE_PROVIDER_SITE_OTHER): Payer: Self-pay | Admitting: Bariatrics

## 2020-12-25 ENCOUNTER — Ambulatory Visit (INDEPENDENT_AMBULATORY_CARE_PROVIDER_SITE_OTHER): Payer: No Typology Code available for payment source | Admitting: Family Medicine

## 2020-12-25 ENCOUNTER — Telehealth: Payer: No Typology Code available for payment source | Admitting: Physician Assistant

## 2020-12-25 DIAGNOSIS — J029 Acute pharyngitis, unspecified: Secondary | ICD-10-CM

## 2020-12-25 NOTE — Progress Notes (Signed)
Chief Complaint:   OBESITY Sarah Cardenas is here to discuss her progress with her obesity treatment plan along with follow-up of her obesity related diagnoses. Sarah Cardenas is on Optavia and states she is following her eating plan approximately 100% of the time. Sarah Cardenas states she is walking 30 minutes 3 times per week.  Today's visit was #: 16 Starting weight: 273 lbs Starting date: 02/24/2020 Today's weight: 269 lbs Today's date: 12/24/2020 Total lbs lost to date: 4 lbs Total lbs lost since last in-office visit: 0  Interim History: Sarah Cardenas is up 3 lbs. She is on Optavia and feels full.   Subjective:   1. Other hyperlipidemia Sarah Cardenas is not on medication.   Lab Results  Component Value Date   ALT 11 08/01/2020   AST 16 08/01/2020   ALKPHOS 93 08/01/2020   BILITOT 0.3 08/01/2020   Lab Results  Component Value Date   CHOL 241 (H) 08/01/2020   HDL 51 08/01/2020   LDLCALC 163 (H) 08/01/2020   LDLDIRECT 161.0 12/07/2018   TRIG 149 08/01/2020   CHOLHDL 4.4 02/24/2020    2. Insulin resistance Sarah Cardenas is taking Metformin. She reports, "It takes the edge off my appetite."  Lab Results  Component Value Date   INSULIN 15.7 08/01/2020   INSULIN 12.0 02/24/2020   Lab Results  Component Value Date   HGBA1C 5.3 08/01/2020    Assessment/Plan:   1. Other hyperlipidemia Cardiovascular risk and specific lipid/LDL goals reviewed.  We discussed several lifestyle modifications today and Sarah Cardenas will continue to work on diet, exercise and weight loss efforts. Orders and follow up as documented in patient record. Increase PUFAs and MUFAs.  Counseling Intensive lifestyle modifications are the first line treatment for this issue. . Dietary changes: Increase soluble fiber. Decrease simple carbohydrates. . Exercise changes: Moderate to vigorous-intensity aerobic activity 150 minutes per week if tolerated. . Lipid-lowering medications: see documented in medical record.  2. Insulin resistance Sarah Cardenas will  continue to work on weight loss, exercise, and decreasing simple carbohydrates to help decrease the risk of diabetes. Sarah Cardenas agreed to follow-up with [place] as directed to closely monitor her progress. Continue Metformin.  3. Class 3 severe obesity due to excess calories with serious comorbidity and body mass index (BMI) of 40.0 to 44.9 in adult Sarah Cardenas is currently in the action stage of change. As such, her goal is to continue with weight loss efforts. She has agreed to Sarah Cardenas.   Will stick with her plan.  Exercise goals: Walking at work  [employer] modification strategies: increasing lean protein intake, decreasing simple carbohydrates, increasing vegetables, increasing water intake, decreasing eating out, no skipping meals, meal planning and cooking strategies, keeping healthy foods in the home and planning for success.  Sarah Cardenas has agreed to follow-up with our clinic in 2-3 weeks with Dawn. She was informed of the importance of frequent follow-up visits to maximize her success with intensive lifestyle modifications for her multiple health conditions.   Objective:   Blood pressure 118/79, pulse 88, temperature 97.7 F (36.5 C), height 5\' 6"  (1.676 m), weight 269 lb (122 kg), SpO2 97 %. Body mass index is 43.42 kg/m.  General: Cooperative, alert, well developed, in no acute distress. HEENT: Conjunctivae and lids unremarkable. Cardiovascular: Regular rhythm.  Lungs: Normal work of breathing. Neurologic: No focal deficits.   Lab Results  Component Value Date   CREATININE 0.91 08/01/2020   BUN 8 08/01/2020   NA 139 08/01/2020   K 4.6 08/01/2020   CL 103  08/01/2020   CO2 22 08/01/2020   Lab Results  Component Value Date   ALT 11 08/01/2020   AST 16 08/01/2020   ALKPHOS 93 08/01/2020   BILITOT 0.3 08/01/2020   Lab Results  Component Value Date   HGBA1C 5.3 08/01/2020   HGBA1C 5.2 02/24/2020   Lab Results  Component Value Date   INSULIN 15.7 08/01/2020   INSULIN 12.0  02/24/2020   Lab Results  Component Value Date   TSH 2.350 02/24/2020   Lab Results  Component Value Date   CHOL 241 (H) 08/01/2020   HDL 51 08/01/2020   LDLCALC 163 (H) 08/01/2020   LDLDIRECT 161.0 12/07/2018   TRIG 149 08/01/2020   CHOLHDL 4.4 02/24/2020   Lab Results  Component Value Date   WBC 8.7 08/01/2020   HGB 13.9 08/01/2020   HCT 42.7 08/01/2020   MCV 88 08/01/2020   PLT 240 08/01/2020   Lab Results  Component Value Date   IRON 72 08/01/2020   TIBC 337 08/01/2020   FERRITIN 19 08/01/2020    Attestation Statements:   Reviewed by clinician on day of visit: allergies, medications, problem list, medical history, surgical history, family history, social history, and previous encounter notes.  Time spent on visit including pre-visit chart review and post-visit care and charting was 20 minutes.   Edmund Hilda, am acting as Energy manager for [employer], DO.  I have reviewed the above documentation for accuracy and completeness, and I agree with the above. Corinna Capra, DO

## 2020-12-25 NOTE — Progress Notes (Signed)
We are sorry that you are not feeling well.  Here is how we plan to help!  Your symptoms indicate a likely viral infection (Pharyngitis).   Pharyngitis is inflammation in the back of the throat which can cause a sore throat, scratchiness and sometimes difficulty swallowing.   Pharyngitis is typically caused by a respiratory virus and will just run its course.  Please keep in mind that your symptoms could last up to 10 days.  For throat pain, we recommend over the counter oral pain relief medications such as acetaminophen or aspirin, or anti-inflammatory medications such as ibuprofen or naproxen sodium.  Topical treatments such as oral throat lozenges or sprays may be used as needed.  Avoid close contact with loved ones, especially the very young and elderly.  Remember to wash your hands thoroughly throughout the day as this is the number one way to prevent the spread of infection and wipe down door knobs and counters with disinfectant.  After careful review of your answers, I would not recommend and antibiotic for your condition.  Antibiotics should not be used to treat conditions that we suspect are caused by viruses like the virus that causes the common cold or flu. However, some people can have Strep with atypical symptoms. You may need formal testing in clinic or office to confirm if your symptoms continue or worsen.  I know you mentioned you had COVID testing. Please let us know when you have your result and what it is, remembering to quarantine until results are in.  Providers prescribe antibiotics to treat infections caused by bacteria. Antibiotics are very powerful in treating bacterial infections when they are used properly.  To maintain their effectiveness, they should be used only when necessary.  Overuse of antibiotics has resulted in the development of super bugs that are resistant to treatment!    Home Care:  Only take medications as instructed by your medical team.  Do not drink alcohol  while taking these medications.  A steam or ultrasonic humidifier can help congestion.  You can place a towel over your head and breathe in the steam from hot water coming from a faucet.  Avoid close contacts especially the very young and the elderly.  Cover your mouth when you cough or sneeze.  Always remember to wash your hands.  Get Help Right Away If:  You develop worsening fever or throat pain.  You develop a severe head ache or visual changes.  Your symptoms persist after you have completed your treatment plan.  Make sure you  Understand these instructions.  Will watch your condition.  Will get help right away if you are not doing well or get worse.  Your e-visit answers were reviewed by a board certified advanced clinical practitioner to complete your personal care plan.  Depending on the condition, your plan could have included both over the counter or prescription medications.  If there is a problem please reply  once you have received a response from your provider.  Your safety is important to [place].  If you have drug allergies check your prescription carefully.    You can use MyChart to ask questions about todays visit, request a non-urgent call back, or ask for a work or school excuse for 24 hours related to this e-Visit. If it has been greater than 24 hours you will need to follow up with your provider, or enter a new e-Visit to address those concerns.  You will get an e-mail in the next two days  days asking about your experience.  I hope that your e-visit has been valuable and will speed your recovery. Thank you for using e-visits.    

## 2020-12-25 NOTE — Progress Notes (Signed)
I have spent 5 minutes in review of e-visit questionnaire, review and updating patient chart, medical decision making and response to patient.   Tyronza Happe Cody Kaela Beitz, PA-C    

## 2020-12-26 ENCOUNTER — Encounter: Payer: Self-pay | Admitting: Physician Assistant

## 2020-12-26 ENCOUNTER — Telehealth (INDEPENDENT_AMBULATORY_CARE_PROVIDER_SITE_OTHER): Payer: No Typology Code available for payment source | Admitting: Physician Assistant

## 2020-12-26 VITALS — Ht 66.0 in | Wt 266.0 lb

## 2020-12-26 DIAGNOSIS — R059 Cough, unspecified: Secondary | ICD-10-CM

## 2020-12-26 MED ORDER — PREDNISONE 20 MG PO TABS: 40.0000 mg | ORAL_TABLET | Freq: Every day | ORAL | 0 refills | Status: DC

## 2020-12-26 MED ORDER — AZITHROMYCIN 250 MG PO TABS: ORAL_TABLET | ORAL | 0 refills | Status: DC

## 2020-12-26 NOTE — Progress Notes (Signed)
Virtual Visit via Video   I connected with Sarah Cardenas on 12/26/20 at  4:00 PM EST by a video enabled telemedicine application and verified that I am speaking with the correct person using two identifiers. Location patient: Home Location provider: [facility], Office Persons participating in the virtual visit: Lahela Nasrin Lanzo, [name] PA-C, Corky Mull, LPN   I discussed the limitations of evaluation and management by telemedicine and the availability of in person appointments. The patient expressed understanding and agreed to proceed.  I acted as a Neurosurgeon for [employer], [employer], LPN   Subjective:   HPI:   Cough Pt c/o cough since Sunday. Symptoms are getting worse with time; was up most of the night last night coughing. Pt is coughing and expectorating brown sputum. Pt is using Delsym, Advil cold and sinus. Pt felt like she had subjective fever yesterday. She is a current smoker.  Patient also endorses laryngitis symptoms and scratchy throat.  She took an at-home Covid test that was negative.  Denies any sick contacts.  She has an albuterol and Symbicort prescription inhaler but she is not using them regularly.   ROS: See pertinent positives and negatives per HPI.  Patient Active Problem List   Diagnosis Date Noted  . Other hyperlipidemia 08/02/2020  . Iron deficiency anemia 08/01/2020  . Insulin resistance 05/01/2020  . Tobacco use disorder 05/01/2020  . Vitamin D deficiency 04/17/2020  . Bipolar 1 disorder (HCC) 04/17/2020  . Class 3 severe obesity with serious comorbidity and body mass index (BMI) of 40.0 to 44.9 in adult (HCC) 12/30/2019  . Bipolar 1 disorder, depressed, severe (HCC) 05/26/2019  . Cervical intraepithelial neoplasia grade 1 12/07/2018    Social History   Tobacco Use  . Smoking status: Current Some Day Smoker    Packs/day: 0.50    Years: 10.00    Pack years: 5.00    Types: Cigarettes  . Smokeless tobacco: Never  Used  Substance Use Topics  . Alcohol use: Yes    Comment: occ    Current Outpatient Medications:  .  albuterol (VENTOLIN HFA) 108 (90 Base) MCG/ACT inhaler, Inhale 2 puffs into the lungs every 6 (six) hours as needed for wheezing or shortness of breath., Disp: 8 g, Rfl: 0 .  azithromycin (ZITHROMAX Z-PAK) 250 MG tablet, Take 2 tablets ( total of 500 mg) PO on day 1, then 1 tablet ( total of 250 mg) PO q24 x 4 days., Disp: 6 each, Rfl: 0 .  budesonide-formoterol (SYMBICORT) 80-4.5 MCG/ACT inhaler, Inhale 2 puffs into the lungs 2 (two) times daily., Disp: 1 Inhaler, Rfl: 1 .  hydrOXYzine (ATARAX/VISTARIL) 25 MG tablet, hydroxyzine HCl 25 mg tablet  TAKE 1/2 TO 1 TABLET BY MOUTH THREE TIMES DAILY AS NEEDED FOR ANXIETY OR PANIC OR AGITATION OR SLEEP, Disp: , Rfl:  .  ibuprofen (ADVIL,MOTRIN) 200 MG tablet, Take 400 mg by mouth every 6 (six) hours as needed., Disp: , Rfl:  .  lamoTRIgine (LAMICTAL) 200 MG tablet, Take 200 mg by mouth 2 (two) times daily., Disp: , Rfl:  .  metFORMIN (GLUCOPHAGE) 500 MG tablet, Take 1 tablet (500 mg total) by mouth 2 (two) times daily with a meal., Disp: 60 tablet, Rfl: 0 .  predniSONE (DELTASONE) 20 MG tablet, Take 2 tablets (40 mg total) by mouth daily., Disp: 10 tablet, Rfl: 0 .  sertraline (ZOLOFT) 100 MG tablet, TK 1 T PO QAM, Disp: , Rfl:  .  Vitamin D, Ergocalciferol, (  DRISDOL) 1.25 MG (50000 UNIT) CAPS capsule, Take 1 capsule (50,000 Units total) by mouth every 7 (seven) days., Disp: 4 capsule, Rfl: 0 .  VRAYLAR capsule, Take 3 mg by mouth every morning., Disp: , Rfl:  .  azelastine (ASTELIN) 0.1 % nasal spray, Place 2 sprays into both nostrils 2 (two) times daily. Use in each nostril as directed (Patient not taking: Reported on 12/26/2020), Disp: 30 mL, Rfl: 12 .  fluticasone (FLONASE) 50 MCG/ACT nasal spray, Place 2 sprays into both nostrils daily. (Patient not taking: Reported on 12/26/2020), Disp: 16 g, Rfl: 2 .  HYDROcodone-acetaminophen (NORCO/VICODIN) 5-325  MG tablet, Take 1 tablet by mouth every 6 (six) hours as needed. (Patient not taking: Reported on 12/26/2020), Disp: 15 tablet, Rfl: 0  Allergies  Allergen Reactions  . Ciprofloxacin Other (See Comments)    numbness    Objective:   VITALS: Per patient if applicable, see vitals. GENERAL: Alert, appears well and in no acute distress. HEENT: Atraumatic, conjunctiva clear, no obvious abnormalities on inspection of external nose and ears. NECK: Normal movements of the head and neck. CARDIOPULMONARY: No increased WOB. Speaking in clear sentences. I:E ratio WNL.  MS: Moves all visible extremities without noticeable abnormality. PSYCH: Pleasant and cooperative, well-groomed. Speech normal rate and rhythm. Affect is appropriate. Insight and judgement are appropriate. Attention is focused, linear, and appropriate.  NEURO: CN grossly intact. Oriented as arrived to appointment on time with no prompting. Moves both UE equally.  SKIN: No obvious lesions, wounds, erythema, or cyanosis noted on face or hands.  Assessment and Plan:   Merl was seen today for cough and laryngitis.  Diagnoses and all orders for this visit:  Cough  Other orders -     azithromycin (ZITHROMAX Z-PAK) 250 MG tablet; Take 2 tablets ( total of 500 mg) PO on day 1, then 1 tablet ( total of 250 mg) PO q24 x 4 days. -     predniSONE (DELTASONE) 20 MG tablet; Take 2 tablets (40 mg total) by mouth daily.   No red flags on exam.  Concern for bronchitis.  Given her smoking history, will initiate prednisone and azithromycin per orders. Discussed taking medications as prescribed. Reviewed return precautions including worsening fever, SOB, worsening cough or other concerns. Push fluids and rest. I recommend that patient follow-up if symptoms worsen or persist despite treatment x 7-10 days, sooner if needed.   I discussed the assessment and treatment plan with the patient. The patient was provided an opportunity to ask questions and  all were answered. The patient agreed with the plan and demonstrated an understanding of the instructions.   The patient was advised to call back or seek an in-person evaluation if the symptoms worsen or if the condition fails to improve as anticipated.   CMA or LPN served as scribe during this visit. History, Physical, and Plan performed by medical provider. The above documentation has been reviewed and is accurate and complete.   [place], [place] 12/26/2020

## 2021-01-10 ENCOUNTER — Ambulatory Visit (INDEPENDENT_AMBULATORY_CARE_PROVIDER_SITE_OTHER): Payer: No Typology Code available for payment source | Admitting: Family Medicine

## 2021-01-10 ENCOUNTER — Encounter (INDEPENDENT_AMBULATORY_CARE_PROVIDER_SITE_OTHER): Payer: Self-pay | Admitting: Family Medicine

## 2021-01-10 ENCOUNTER — Other Ambulatory Visit: Payer: Self-pay

## 2021-01-10 VITALS — BP 125/75 | HR 89 | Temp 98.0°F | Ht 66.0 in | Wt 271.0 lb

## 2021-01-10 DIAGNOSIS — Z9189 Other specified personal risk factors, not elsewhere classified: Secondary | ICD-10-CM

## 2021-01-10 DIAGNOSIS — E8881 Metabolic syndrome: Secondary | ICD-10-CM | POA: Diagnosis not present

## 2021-01-10 DIAGNOSIS — E559 Vitamin D deficiency, unspecified: Secondary | ICD-10-CM | POA: Diagnosis not present

## 2021-01-10 DIAGNOSIS — F319 Bipolar disorder, unspecified: Secondary | ICD-10-CM

## 2021-01-10 DIAGNOSIS — E88819 Insulin resistance, unspecified: Secondary | ICD-10-CM

## 2021-01-10 DIAGNOSIS — Z6841 Body Mass Index (BMI) 40.0 and over, adult: Secondary | ICD-10-CM

## 2021-01-10 MED ORDER — METFORMIN HCL 500 MG PO TABS
500.0000 mg | ORAL_TABLET | Freq: Three times a day (TID) | ORAL | 0 refills | Status: DC
Start: 2021-01-10 — End: 2022-03-20

## 2021-01-10 MED ORDER — VITAMIN D (ERGOCALCIFEROL) 1.25 MG (50000 UNIT) PO CAPS: 50000.0000 [IU] | ORAL_CAPSULE | ORAL | 0 refills | Status: DC

## 2021-01-11 NOTE — Progress Notes (Signed)
Chief Complaint:   OBESITY Sarah Cardenas is here to discuss her progress with her obesity treatment plan along with follow-up of her obesity related diagnoses. Sarah Cardenas is on Optavia and states she is following her eating plan approximately 100% of the time. Sarah Cardenas states she is walking 15-20 minutes 7 times per week.  Today's visit was #: 17 Starting weight: 273 lbs Starting date: 02/24/2020 Today's weight: 271 lbs Today's date: 01/10/2021 Total lbs lost to date: 2 lbs Total lbs lost since last in-office visit: 0  Interim History: Sarah Cardenas is still using the Optavia during the day, but eats out every night and does not stick to plan. She does not feel she can stick to plan due to exacerbation of Bipolar I Disorder recently. She says she is really struggling.  Subjective:   1. Vitamin D deficiency Sarah Cardenas's Vitamin D level was slightly low at 45.1 on 08/01/2020. She is currently taking prescription vitamin D 50,000 IU each week.    Ref. Range 08/01/2020 07:44  Vitamin D, 25-Hydroxy Latest Ref Range: 30.0 - 100.0 ng/mL 45.1   2. Insulin resistance Sarah Cardenas notes significant polyphagia. She is taking Metformin TID. We discussed starting Ozempic with 3 month coupon, but she declined due to injection.  Lab Results  Component Value Date   INSULIN 15.7 08/01/2020   INSULIN 12.0 02/24/2020   Lab Results  Component Value Date   HGBA1C 5.3 08/01/2020    3. Bipolar 1 disorder (HCC) Sarah Cardenas notes this is not currently well controlled. She says she is very impulsive and it is hard to make good choices. She sees a Veterinary surgeon weekly and psychiatrist every few months.  4. At risk for side effect of medication Sarah Cardenas is at risk for side effect of medication due to increase in Metformin.  Assessment/Plan:   1. Vitamin D deficiency . She agrees to continue to take prescription Vitamin D @50 ,000 IU every week and will follow-up for routine testing of Vitamin D, at least 2-3 times per year to avoid  over-replacement.  - Vitamin D, Ergocalciferol, (DRISDOL) 1.25 MG (50000 UNIT) CAPS capsule; Take 1 capsule (50,000 Units total) by mouth every 7 (seven) days.  Dispense: 12 capsule; Refill: 0  2. Insulin resistance Refill:  - metFORMIN (GLUCOPHAGE) 500 MG tablet; Take 1 tablet (500 mg total) by mouth 3 (three) times daily.  Dispense: 270 tablet; Refill: 0  3. Bipolar 1 disorder (HCC) Continue all medications. Continue with counselor and psychiatrist.  4. At risk for side effect of medication Sarah Cardenas was given approximately 15 minutes of drug side effect counseling today.  We discussed side effect possibility and risk versus benefits. Sarah Cardenas agreed to the medication and will contact this office if these side effects are intolerable.  Repetitive spaced learning was employed today to elicit superior memory formation and behavioral change.  5. Class 3 severe obesity due to excess calories with serious comorbidity and body mass index (BMI) of 40.0 to 44.9 in adult Sarah Cardenas) Sarah Cardenas is currently in the action stage of change. As such, her goal is to continue with weight loss efforts. She has agreed to practicing portion control and making smarter food choices, such as increasing vegetables and decreasing simple carbohydrates and Optavia.   Sarah Cardenas will try to eat supper at home half the time Discussed Topamax and Sarah Cardenas will ask psych about this.  Exercise goals: As is  Behavioral modification strategies: decreasing simple carbohydrates and decreasing eating out.  Sarah Cardenas  And I discussed follow up time frame. She  feels unable to work on her diet habits at this point in time. We mutually agreed on 12 week follow up.   Objective:   Blood pressure 125/75, pulse 89, temperature 98 F (36.7 C), temperature source Oral, height 5\' 6"  (1.676 m), weight 271 lb (122.9 kg), SpO2 95 %. Body mass index is 43.74 kg/m.  General: Cooperative, alert, well developed, in no acute distress. HEENT: Conjunctivae and  lids unremarkable. Cardiovascular: Regular rhythm.  Lungs: Normal work of breathing. Neurologic: No focal deficits.   Lab Results  Component Value Date   CREATININE 0.91 08/01/2020   BUN 8 08/01/2020   NA 139 08/01/2020   K 4.6 08/01/2020   CL 103 08/01/2020   CO2 22 08/01/2020   Lab Results  Component Value Date   ALT 11 08/01/2020   AST 16 08/01/2020   ALKPHOS 93 08/01/2020   BILITOT 0.3 08/01/2020   Lab Results  Component Value Date   HGBA1C 5.3 08/01/2020   HGBA1C 5.2 02/24/2020   Lab Results  Component Value Date   INSULIN 15.7 08/01/2020   INSULIN 12.0 02/24/2020   Lab Results  Component Value Date   TSH 2.350 02/24/2020   Lab Results  Component Value Date   CHOL 241 (H) 08/01/2020   HDL 51 08/01/2020   LDLCALC 163 (H) 08/01/2020   LDLDIRECT 161.0 12/07/2018   TRIG 149 08/01/2020   CHOLHDL 4.4 02/24/2020   Lab Results  Component Value Date   WBC 8.7 08/01/2020   HGB 13.9 08/01/2020   HCT 42.7 08/01/2020   MCV 88 08/01/2020   PLT 240 08/01/2020   Lab Results  Component Value Date   IRON 72 08/01/2020   TIBC 337 08/01/2020   FERRITIN 19 08/01/2020    Attestation Statements:   Reviewed by clinician on day of visit: allergies, medications, problem list, medical history, surgical history, family history, social history, and previous encounter notes.  10/01/2020, am acting as Edmund Hilda for Energy manager, FNP.  I have reviewed the above documentation for accuracy and completeness, and I agree with the above. -  [place], FNP

## 2021-01-12 ENCOUNTER — Encounter (INDEPENDENT_AMBULATORY_CARE_PROVIDER_SITE_OTHER): Payer: Self-pay | Admitting: Family Medicine

## 2021-02-06 ENCOUNTER — Telehealth (INDEPENDENT_AMBULATORY_CARE_PROVIDER_SITE_OTHER): Payer: No Typology Code available for payment source | Admitting: Family

## 2021-02-06 ENCOUNTER — Encounter: Payer: Self-pay | Admitting: Family

## 2021-02-06 ENCOUNTER — Encounter: Payer: Self-pay | Admitting: Physician Assistant

## 2021-02-06 VITALS — Ht 66.0 in | Wt 270.9 lb

## 2021-02-06 DIAGNOSIS — A09 Infectious gastroenteritis and colitis, unspecified: Secondary | ICD-10-CM

## 2021-02-06 DIAGNOSIS — B349 Viral infection, unspecified: Secondary | ICD-10-CM

## 2021-02-06 DIAGNOSIS — R059 Cough, unspecified: Secondary | ICD-10-CM | POA: Diagnosis not present

## 2021-02-06 NOTE — Progress Notes (Signed)
Virtual Visit via Video   I connected with patient on 02/06/21 at  2:20 PM EDT by a video enabled telemedicine application and verified that I am speaking with the correct person using two identifiers.  Location patient: Home Location provider: [employer] school teacher [place], [place] Persons participating in the virtual visit: Patient, Sarah Cardenas, Sarah Cardenas   I discussed the limitations of evaluation and management by telemedicine and the availability of in person appointments. The patient expressed understanding and agreed to proceed.  Subjective:   HPI:  35 year old female, smoker has concerns of diarrhea, fever, cough, dizziness x2 days.  She has been unable to take her temperature.  Does feel fatigued.  Received the COVID-19 vaccine but is not boosted to date.  Did not receive the influenza vaccine.  Not currently taking any medications.  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  She has a rapid antigen test at home but she has not taken it.  ROS:   See pertinent positives and negatives per HPI.  Patient Active Problem List   Diagnosis Date Noted  . Other hyperlipidemia 08/02/2020  . Iron deficiency anemia 08/01/2020  . Insulin resistance 05/01/2020  . Tobacco use disorder 05/01/2020  . Vitamin D deficiency 04/17/2020  . Bipolar 1 disorder (HCC) 04/17/2020  . Class 3 severe obesity with serious comorbidity and body mass index (BMI) of 40.0 to 44.9 in adult (HCC) 12/30/2019  . Bipolar 1 disorder, depressed, severe (HCC) 05/26/2019  . Cervical intraepithelial neoplasia grade 1 12/07/2018    Social History   Tobacco Use  . Smoking status: Current Some Day Smoker    Packs/day: 0.50    Years: 10.00    Pack years: 5.00    Types: Cigarettes  . Smokeless tobacco: Never Used  Substance Use Topics  . Alcohol use: Yes    Comment: occ    Current Outpatient Medications:  .  albuterol (VENTOLIN HFA) 108 (90 Base) MCG/ACT inhaler, Inhale 2 puffs into the  lungs every 6 (six) hours as needed for wheezing or shortness of breath., Disp: 8 g, Rfl: 0 .  hydrOXYzine (ATARAX/VISTARIL) 25 MG tablet, hydroxyzine HCl 25 mg tablet  TAKE 1/2 TO 1 TABLET BY MOUTH THREE TIMES DAILY AS NEEDED FOR ANXIETY OR PANIC OR AGITATION OR SLEEP, Disp: , Rfl:  .  ibuprofen (ADVIL,MOTRIN) 200 MG tablet, Take 400 mg by mouth every 6 (six) hours as needed., Disp: , Rfl:  .  lamoTRIgine (LAMICTAL) 200 MG tablet, Take 200 mg by mouth 2 (two) times daily., Disp: , Rfl:  .  metFORMIN (GLUCOPHAGE) 500 MG tablet, Take 1 tablet (500 mg total) by mouth 3 (three) times daily., Disp: 270 tablet, Rfl: 0 .  sertraline (ZOLOFT) 100 MG tablet, TK 1 T PO QAM, Disp: , Rfl:  .  Vitamin D, Ergocalciferol, (DRISDOL) 1.25 MG (50000 UNIT) CAPS capsule, Take 1 capsule (50,000 Units total) by mouth every 7 (seven) days., Disp: 12 capsule, Rfl: 0 .  VRAYLAR capsule, Take 3 mg by mouth every morning., Disp: , Rfl:  .  azelastine (ASTELIN) 0.1 % nasal spray, Place 2 sprays into both nostrils 2 (two) times daily. Use in each nostril as directed (Patient not taking: Reported on 02/06/2021), Disp: 30 mL, Rfl: 12 .  fluticasone (FLONASE) 50 MCG/ACT nasal spray, Place 2 sprays into both nostrils daily. (Patient not taking: Reported on 02/06/2021), Disp: 16 g, Rfl: 2 .  HYDROcodone-acetaminophen (NORCO/VICODIN) 5-325 MG tablet, Take 1 tablet by  mouth every 6 (six) hours as needed. (Patient not taking: Reported on 02/06/2021), Disp: 15 tablet, Rfl: 0  Allergies  Allergen Reactions  . Ciprofloxacin Other (See Comments)    numbness    Objective:   Ht 5\' 6"  (1.676 m)   Wt 270 lb 15.1 oz (122.9 kg)   BMI 43.73 kg/m   Patient is well-developed, well-nourished in no acute distress.  Resting comfortably at home.  Head is normocephalic, atraumatic.  No labored breathing.  Speech is clear and coherent with logical content.  Patient is alert and oriented at baseline.  Rapid COVID at home was  negative  Assessment and Plan:    Reeva was seen today for fatigue, emesis and diarrhea.  Diagnoses and all orders for this visit:  Diarrhea of infectious origin  Viral illness  Cough  Advised patient to take her rapid antigen COVID test and call with results.  In the meantime, rest, drink plenty of fluids, call if symptoms worsen or persist.   , FNP 02/06/2021  Time spent with the patient: 15 minutes, of which >50% was spent in obtaining information about symptoms, reviewing previous labs, evaluations, and treatments, counseling about condition (please see the discussed topics above), and developing a plan to further investigate it; had a number of questions which I addressed.

## 2021-02-06 NOTE — Telephone Encounter (Signed)
Please call pt and schedule virtual appt with Padonda this afternoon. Sam is full.

## 2021-02-06 NOTE — Telephone Encounter (Signed)
Patient has been scheduled

## 2021-03-05 ENCOUNTER — Encounter (INDEPENDENT_AMBULATORY_CARE_PROVIDER_SITE_OTHER): Payer: Self-pay

## 2021-03-22 ENCOUNTER — Encounter: Payer: Self-pay | Admitting: Family Medicine

## 2021-03-27 NOTE — Progress Notes (Signed)
   I, Christoper Fabian, LAT, ATC, am serving as scribe for Dr. Clementeen Graham.  Sarah Cardenas is a 35 y.o. female who presents to [facility] today for R shoulder /scapular pain.  She was last seen by Dr. Denyse Amass on 11/24/20 for neck and L shoulder pain and was referred to PT and prescribed several medications.  Since then, pt reports new-onset R shoulder pain x approximately 1.5 weeks w/ no known MOI.  She was seen at Urgent Care on 03/23/21 and was given a Toradol injection.  Since then, pt reports that her pain improved some from the Toradol injection but after a few days it came back.  Pain started while she was at the beach for vacation.  She had to do a lot of lifting her beach bag to carry to the beach in her right arm.  Radiating pain: yes into her R scapula R shoulder mechanical symptoms: No Aggravating factors: L cervical sidebending; cervical flexion/extension; carrying/lifting w/ her R UE Treatments tried: Motrin; Tizanidine; Toradol injection;   Diagnostic imaging: C-spine XR- 11/24/20  Pertinent review of systems: No fevers or chills  Relevant historical information: Bipolar disorder.  Obesity   Exam:  BP 104/74 (BP Location: Left Arm, Patient Position: Sitting, Cuff Size: Large)   Pulse 79   Ht 5\' 6"  (1.676 m)   Wt 272 lb 3.2 oz (123.5 kg)   SpO2 97%   BMI 43.93 kg/m  General: Well Developed, well nourished, and in no acute distress.   MSK: C-spine normal-appearing Nontender midline. Tender palpation right cervical paraspinal musculature and rhomboid region. Normal cervical motion. Upper extremity strength is intact. Reflexes are intact.  Right shoulder normal-appearing nontender normal shoulder motion.  Negative impingement testing.      Assessment and Plan: 35 y.o. female with right cervical paraspinal muscular spasm and pain.  Additionally patient is some rhomboid dysfunction as well.  The trigger for this was very likely her vacation to  the beach where she had to lift a heavy beach bag and carry it to the beach.  Discussed options.  Primary treatment method will be physical therapy.  Recommend also heating pad TENS unit and massage gun.  Will use tizanidine as well which should be beneficial.  Recheck back as needed especially if not improving.   PDMP not reviewed this encounter. Orders Placed This Encounter  Procedures  . Ambulatory referral to Physical Therapy    Referral Priority:   Routine    Referral Type:   Physical Medicine    Referral Reason:   Specialty Services Required    Requested Specialty:   Physical Therapy   No orders of the defined types were placed in this encounter.    Discussed warning signs or symptoms. Please see discharge instructions. Patient expresses understanding.   The above documentation has been reviewed and is accurate and complete 31, M.D.

## 2021-03-28 ENCOUNTER — Other Ambulatory Visit: Payer: Self-pay

## 2021-03-28 ENCOUNTER — Encounter: Payer: Self-pay | Admitting: Family Medicine

## 2021-03-28 ENCOUNTER — Ambulatory Visit: Payer: No Typology Code available for payment source | Admitting: Family Medicine

## 2021-03-28 VITALS — BP 104/74 | HR 79 | Ht 66.0 in | Wt 272.2 lb

## 2021-03-28 DIAGNOSIS — M7918 Myalgia, other site: Secondary | ICD-10-CM | POA: Diagnosis not present

## 2021-03-28 DIAGNOSIS — M62838 Other muscle spasm: Secondary | ICD-10-CM

## 2021-03-28 NOTE — Patient Instructions (Addendum)
Thank you for coming in today.  I've referred you to Physical Therapy.  Let us know if you don't hear from them in one week.  Heating pad.   TENS  Unit.   Massage gun.   If not improving let me know. We can get you in again. Recheck in 6 weeks if needed.   Ok to use the tizanidine muscle relaxer. Let me know if you need more.    Cervical Strain and Sprain Rehab Ask your health care provider which exercises are safe for you. Do exercises exactly as told by your health care provider and adjust them as directed. It is normal to feel mild stretching, pulling, tightness, or discomfort as you do these exercises. Stop right away if you feel sudden pain or your pain gets worse. Do not begin these exercises until told by your health care provider. Stretching and range-of-motion exercises Cervical side bending 1. Using good posture, sit on a stable chair or stand up. 2. Without moving your shoulders, slowly tilt your left / right ear to your shoulder until you feel a stretch in the opposite side neck muscles. You should be looking straight ahead. 3. Hold for __________ seconds. 4. Repeat with the other side of your neck. Repeat __________ times. Complete this exercise __________ times a day.   Cervical rotation 1. Using good posture, sit on a stable chair or stand up. 2. Slowly turn your head to the side as if you are looking over your left / right shoulder. ? Keep your eyes level with the ground. ? Stop when you feel a stretch along the side and the back of your neck. 3. Hold for __________ seconds. 4. Repeat this by turning to your other side. Repeat __________ times. Complete this exercise __________ times a day.   Thoracic extension and pectoral stretch 1. Roll a towel or a small blanket so it is about 4 inches (10 cm) in diameter. 2. Lie down on your back on a firm surface. 3. Put the towel lengthwise, under your spine in the middle of your back. It should not be under your shoulder  blades. The towel should line up with your spine from your middle back to your lower back. 4. Put your hands behind your head and let your elbows fall out to your sides. 5. Hold for __________ seconds. Repeat __________ times. Complete this exercise __________ times a day. Strengthening exercises Isometric upper cervical flexion 1. Lie on your back with a thin pillow behind your head and a small rolled-up towel under your neck. 2. Gently tuck your chin toward your chest and nod your head down to look toward your feet. Do not lift your head off the pillow. 3. Hold for __________ seconds. 4. Release the tension slowly. Relax your neck muscles completely before you repeat this exercise. Repeat __________ times. Complete this exercise __________ times a day. Isometric cervical extension 1. Stand about 6 inches (15 cm) away from a wall, with your back facing the wall. 2. Place a soft object, about 6-8 inches (15-20 cm) in diameter, between the back of your head and the wall. A soft object could be a small pillow, a ball, or a folded towel. 3. Gently tilt your head back and press into the soft object. Keep your jaw and forehead relaxed. 4. Hold for __________ seconds. 5. Release the tension slowly. Relax your neck muscles completely before you repeat this exercise. Repeat __________ times. Complete this exercise __________ times a day.   Posture  and body mechanics Body mechanics refers to the movements and positions of your body while you do your daily activities. Posture is part of body mechanics. Good posture and healthy body mechanics can help to relieve stress in your body's tissues and joints. Good posture means that your spine is in its natural S-curve position (your spine is neutral), your shoulders are pulled back slightly, and your head is not tipped forward. The following are general guidelines for applying improved posture and body mechanics to your everyday activities. Sitting 1. When  sitting, keep your spine neutral and keep your feet flat on the floor. Use a footrest, if necessary, and keep your thighs parallel to the floor. Avoid rounding your shoulders, and avoid tilting your head forward. 2. When working at a desk or a computer, keep your desk at a height where your hands are slightly lower than your elbows. Slide your chair under your desk so you are close enough to maintain good posture. 3. When working at a computer, place your monitor at a height where you are looking straight ahead and you do not have to tilt your head forward or downward to look at the screen.   Standing  When standing, keep your spine neutral and keep your feet about hip-width apart. Keep a slight bend in your knees. Your ears, shoulders, and hips should line up.  When you do a task in which you stand in one place for a long time, place one foot up on a stable object that is 2-4 inches (5-10 cm) high, such as a footstool. This helps keep your spine neutral.   Resting When lying down and resting, avoid positions that are most painful for you. Try to support your neck in a neutral position. You can use a contour pillow or a small rolled-up towel. Your pillow should support your neck but not push on it. This information is not intended to replace advice given to you by your health care provider. Make sure you discuss any questions you have with your health care provider. Document Revised: 01/27/2019 Document Reviewed: 07/08/2018 Elsevier Patient Education  2021 [employer].

## 2021-04-04 ENCOUNTER — Ambulatory Visit (INDEPENDENT_AMBULATORY_CARE_PROVIDER_SITE_OTHER): Payer: No Typology Code available for payment source | Admitting: Family Medicine

## 2021-04-09 ENCOUNTER — Encounter (HOSPITAL_COMMUNITY): Payer: Self-pay

## 2021-05-08 ENCOUNTER — Telehealth: Payer: No Typology Code available for payment source | Admitting: Emergency Medicine

## 2021-05-08 DIAGNOSIS — R059 Cough, unspecified: Secondary | ICD-10-CM

## 2021-05-08 DIAGNOSIS — R0789 Other chest pain: Secondary | ICD-10-CM

## 2021-05-08 NOTE — Progress Notes (Signed)
Based on what you shared with me, I feel your condition warrants further evaluation and I recommend that you be seen in a face to face visit.   NOTE: There will be NO CHARGE for this eVisit   If you are having a true medical emergency please call 911.      For an urgent face to face visit, [facility] has six urgent care centers for your convenience:     [facility] [place] Get Driving Directions [phone] [address] [place], [place] [address]    [facility] ([place] Get Driving Directions [phone] [address] [place], [place] [address]  [facility] ([place]) Get Driving Directions [phone] [address] [place],  [place]  [address]  [facility] Get Driving Directions [phone] [address], [address] , [place] [address]   [facility] [place] Get Driving Directions  [phone] [address] [place], [place] [address]    [facility] Get Driving Directions [phone] [address] [place], [place] [address]  Your MyChart E-visit questionnaire answers were reviewed by a board certified advanced clinical practitioner to complete your personal care plan based on your specific symptoms.  Thank you for using e-Visits.   Approximately 5 minutes was used in reviewing the patient's chart, questionnaire, prescribing medications, and documentation.  

## 2021-05-16 ENCOUNTER — Telehealth: Payer: No Typology Code available for payment source | Admitting: Family Medicine

## 2021-05-16 DIAGNOSIS — R059 Cough, unspecified: Secondary | ICD-10-CM

## 2021-05-16 MED ORDER — PREDNISONE 10 MG (21) PO TBPK: ORAL_TABLET | ORAL | 0 refills | Status: DC

## 2021-05-16 MED ORDER — BENZONATATE 100 MG PO CAPS: 100.0000 mg | ORAL_CAPSULE | Freq: Three times a day (TID) | ORAL | 0 refills | Status: DC | PRN

## 2021-05-16 NOTE — Progress Notes (Signed)
We are sorry that you are not feeling well.  Here is how we plan to help!  Based on your presentation I believe you most likely have A cough due to reflux. To lessen this cough you can use the over-the counter cough medication Delsym but it is very important that we control the cause of the cough.  I suggest that you begin Prilosec 20 mg twice a day for 2 weeks.  You should see the cough lessen quickly as your reflux ( which may be silent or without burning ) is treated.    In addition you may use A prescription cough medication called Tessalon Perles 100mg . You may take 1-2 capsules every 8 hours as needed for your cough.  Prednisone 10 mg daily for 6 days (see taper instructions below)  From your responses in the eVisit questionnaire you describe inflammation in the upper respiratory tract which is causing a significant cough.  This is commonly called Bronchitis and has four common causes:   Allergies Viral Infections Acid Reflux Bacterial Infection Allergies, viruses and acid reflux are treated by controlling symptoms or eliminating the cause. An example might be a cough caused by taking certain blood pressure medications. You stop the cough by changing the medication. Another example might be a cough caused by acid reflux. Controlling the reflux helps control the cough.  USE OF BRONCHODILATOR ("RESCUE") INHALERS: There is a risk from using your bronchodilator too frequently.  The risk is that over-reliance on a medication which only relaxes the muscles surrounding the breathing tubes can reduce the effectiveness of medications prescribed to reduce swelling and congestion of the tubes themselves.  Although you feel brief relief from the bronchodilator inhaler, your asthma may actually be worsening with the tubes becoming more swollen and filled with mucus.  This can delay other crucial treatments, such as oral steroid medications. If you need to use a bronchodilator inhaler daily, several times  per day, you should discuss this with your provider.  There are probably better treatments that could be used to keep your asthma under control.     HOME CARE Only take medications as instructed by your medical team. Complete the entire course of an antibiotic. Drink plenty of fluids and get plenty of rest. Avoid close contacts especially the very young and the elderly Cover your mouth if you cough or cough into your sleeve. Always remember to wash your hands A steam or ultrasonic humidifier can help congestion.   GET HELP RIGHT AWAY IF: You develop worsening fever. You become short of breath You cough up blood. Your symptoms persist after you have completed your treatment plan MAKE SURE YOU  Understand these instructions. Will watch your condition. Will get help right away if you are not doing well or get worse.    Thank you for choosing an e-visit.  Your e-visit answers were reviewed by a board certified advanced clinical practitioner to complete your personal care plan. Depending upon the condition, your plan could have included both over the counter or prescription medications.  Please review your pharmacy choice. Make sure the pharmacy is open so you can pick up prescription now. If there is a problem, you may contact your provider through and have the prescription routed to another pharmacy.  Your safety is important to [employer]. If you have drug allergies check your prescription carefully.   For the next 24 hours you can use MyChart to ask questions about today's visit, request a non-urgent call back, or  ask for a work or school excuse. You will get an email in the next two days asking about your experience. I hope that your e-visit has been valuable and will speed your recovery.  I provided 10 minutes of non face-to-face time during this encounter for chart review, medication and order placement, as well as and documentation.

## 2021-06-06 ENCOUNTER — Telehealth: Payer: No Typology Code available for payment source | Admitting: Physician Assistant

## 2021-06-06 DIAGNOSIS — J019 Acute sinusitis, unspecified: Secondary | ICD-10-CM

## 2021-06-06 DIAGNOSIS — B9789 Other viral agents as the cause of diseases classified elsewhere: Secondary | ICD-10-CM

## 2021-06-06 MED ORDER — IPRATROPIUM BROMIDE 0.03 % NA SOLN: 2.0000 | Freq: Two times a day (BID) | NASAL | 0 refills | Status: DC

## 2021-06-06 NOTE — Progress Notes (Signed)

## 2021-06-06 NOTE — Progress Notes (Signed)
I have spent 5 minutes in review of e-visit questionnaire, review and updating patient chart, medical decision making and response to patient.   Chalon Zobrist Cody Chistian Kasler, PA-C    

## 2021-07-19 NOTE — Progress Notes (Unsigned)
Tesr=t

## 2021-07-25 ENCOUNTER — Telehealth: Payer: No Typology Code available for payment source | Admitting: Physician Assistant

## 2021-07-25 DIAGNOSIS — J029 Acute pharyngitis, unspecified: Secondary | ICD-10-CM

## 2021-07-25 NOTE — Progress Notes (Signed)
  E-Visit for Sore Throat  We are sorry that you are not feeling well.  Here is how we plan to help!  Your symptoms indicate a likely viral infection (Pharyngitis).   Pharyngitis is inflammation in the back of the throat which can cause a sore throat, scratchiness and sometimes difficulty swallowing.   Pharyngitis is typically caused by a respiratory virus and will just run its course.  Please keep in mind that your symptoms could last up to 10 days.  For throat pain, we recommend over the counter oral pain relief medications such as acetaminophen or aspirin, or anti-inflammatory medications such as ibuprofen or naproxen sodium.  Topical treatments such as oral throat lozenges or sprays may be used as needed.  Avoid close contact with loved ones, especially the very young and elderly.  Remember to wash your hands thoroughly throughout the day as this is the number one way to prevent the spread of infection and wipe down door knobs and counters with disinfectant.  After careful review of your answers, I would not recommend and antibiotic for your condition.  Antibiotics should not be used to treat conditions that we suspect are caused by viruses like the virus that causes the common cold or flu. However, some people can have Strep with atypical symptoms. You may need formal testing in clinic or office to confirm if your symptoms continue or worsen.  Providers prescribe antibiotics to treat infections caused by bacteria. Antibiotics are very powerful in treating bacterial infections when they are used properly.  To maintain their effectiveness, they should be used only when necessary.  Overuse of antibiotics has resulted in the development of super bugs that are resistant to treatment!    Home Care: Only take medications as instructed by your medical team. Do not drink alcohol while taking these medications. A steam or ultrasonic humidifier can help congestion.  You can place a towel over your head and  breathe in the steam from hot water coming from a faucet. Avoid close contacts especially the very young and the elderly. Cover your mouth when you cough or sneeze. Always remember to wash your hands.  Get Help Right Away If: You develop worsening fever or throat pain. You develop a severe head ache or visual changes. Your symptoms persist after you have completed your treatment plan.  Make sure you Understand these instructions. Will watch your condition. Will get help right away if you are not doing well or get worse.   Thank you for choosing an e-visit.  Your e-visit answers were reviewed by a board certified advanced clinical practitioner to complete your personal care plan. Depending upon the condition, your plan could have included both over the counter or prescription medications.  Please review your pharmacy choice. Make sure the pharmacy is open so you can pick up prescription now. If there is a problem, you may contact your provider through [employer] and have the prescription routed to another pharmacy.  Your safety is important to [place]. If you have drug allergies check your prescription carefully.   For the next 24 hours you can use MyChart to ask questions about today's visit, request a non-urgent call back, or ask for a work or school excuse. You will get an email in the next two days asking about your experience. I hope that your e-visit has been valuable and will speed your recovery.

## 2021-07-25 NOTE — Progress Notes (Signed)
I have spent 5 minutes in review of e-visit questionnaire, review and updating patient chart, medical decision making and response to patient.   Eyonna Sandstrom Cody Katarina Riebe, PA-C    

## 2021-07-25 NOTE — Progress Notes (Signed)
Message sent to patient requesting further input regarding current symptoms. Awaiting patient response.  

## 2021-08-06 DIAGNOSIS — F4310 Post-traumatic stress disorder, unspecified: Secondary | ICD-10-CM | POA: Insufficient documentation

## 2021-08-06 DIAGNOSIS — F429 Obsessive-compulsive disorder, unspecified: Secondary | ICD-10-CM | POA: Insufficient documentation

## 2021-08-06 DIAGNOSIS — F419 Anxiety disorder, unspecified: Secondary | ICD-10-CM | POA: Insufficient documentation

## 2021-08-17 IMAGING — MR MR LUMBAR SPINE W/O CM
4 of 5 series · 26 of 48 positions shown · non-contrast
Comparison: Lumbar radiographs 12/30/2019

CLINICAL DATA: Low back pain and intermittent bilateral leg pain.

EXAM:
MRI LUMBAR SPINE WITHOUT CONTRAST
TECHNIQUE: Multiplanar, multisequence MR imaging of the lumbar spine was
performed. No intravenous contrast was administered.

[Series 4: T2 · sagittal · 4.0mm · 0.81mm/px · 6 of 17 slices shown (1 of 2)]
[im 1/17]
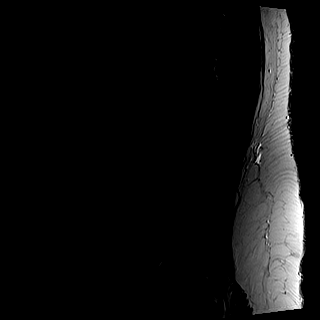
[im 4/17]
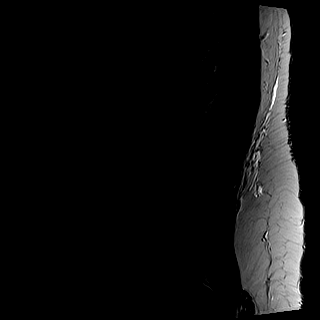
[im 7/17]
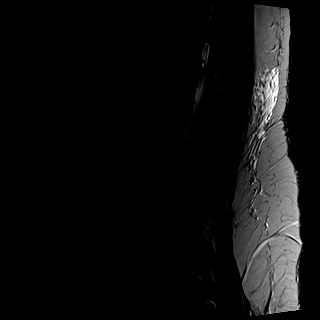
[im 10/17]
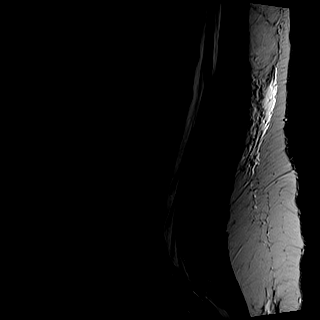
[im 13/17]
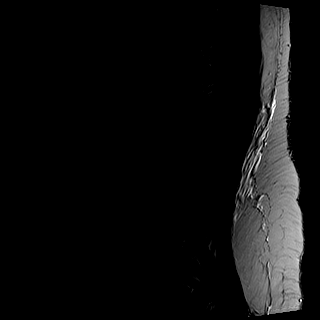
[im 17/17]
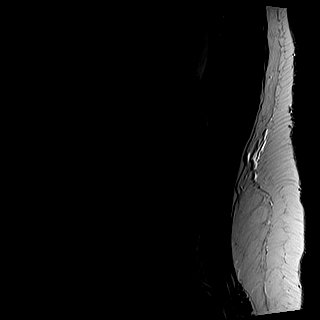

[Series 5: T1 · sagittal · 4.0mm · 0.41mm/px · 6 of 17 slices shown (1 of 2)]
[im 1/17]
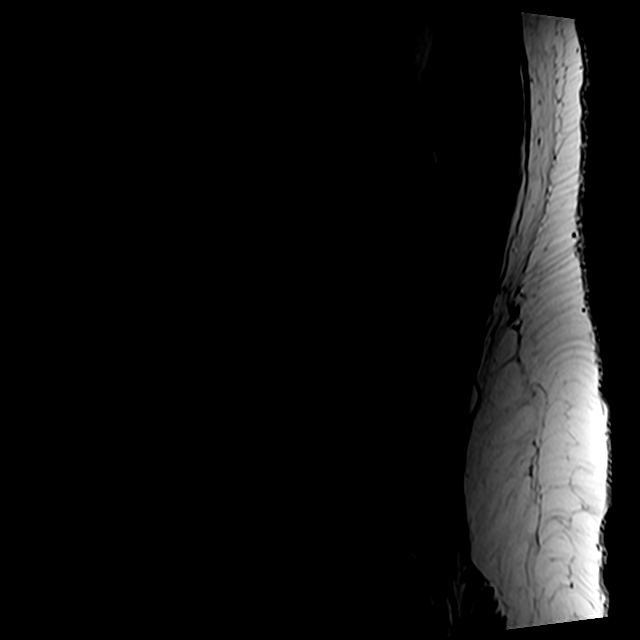
[im 4/17]
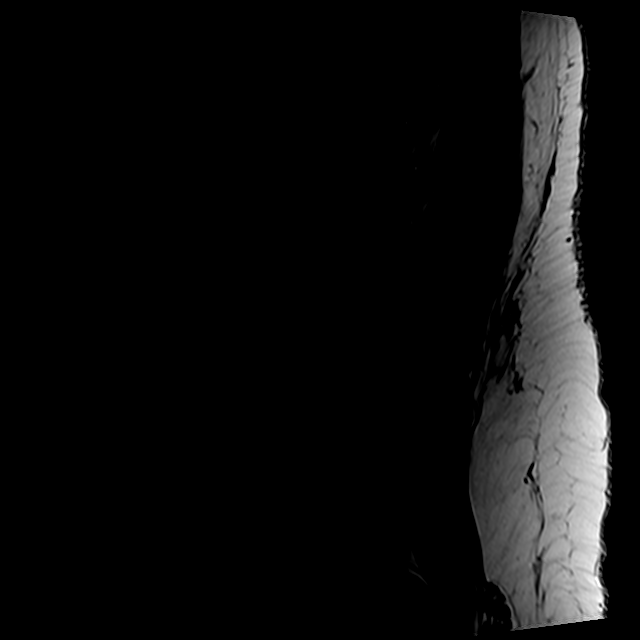
[im 7/17]
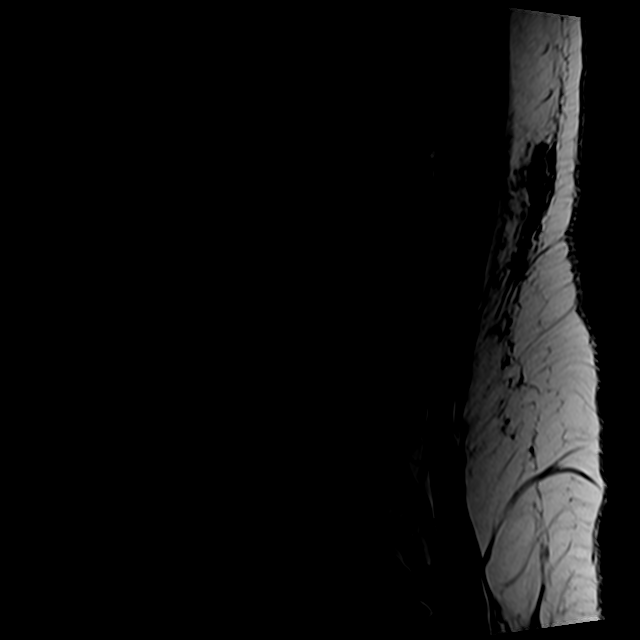
[im 10/17]
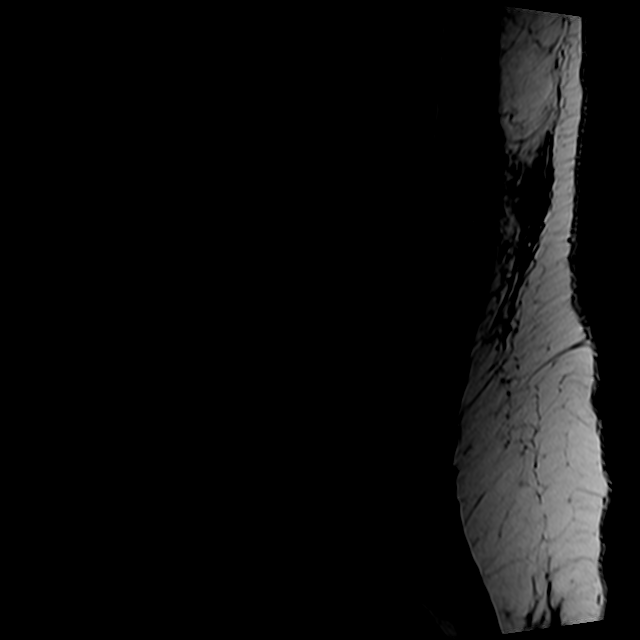
[im 13/17]
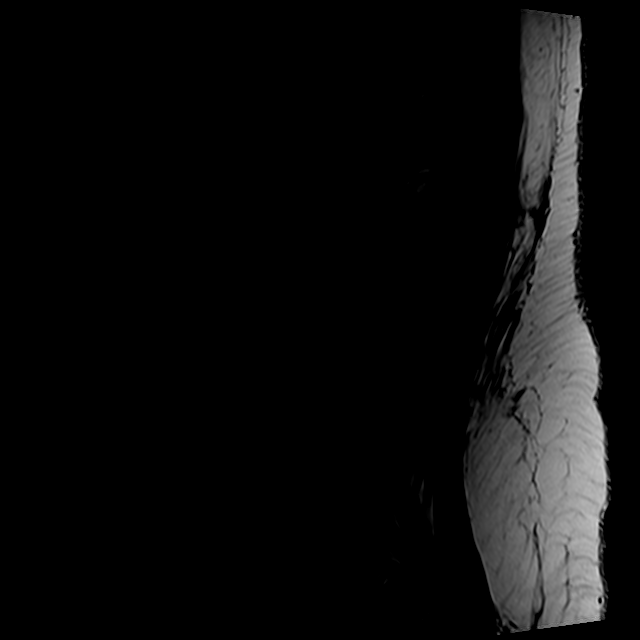
[im 17/17]
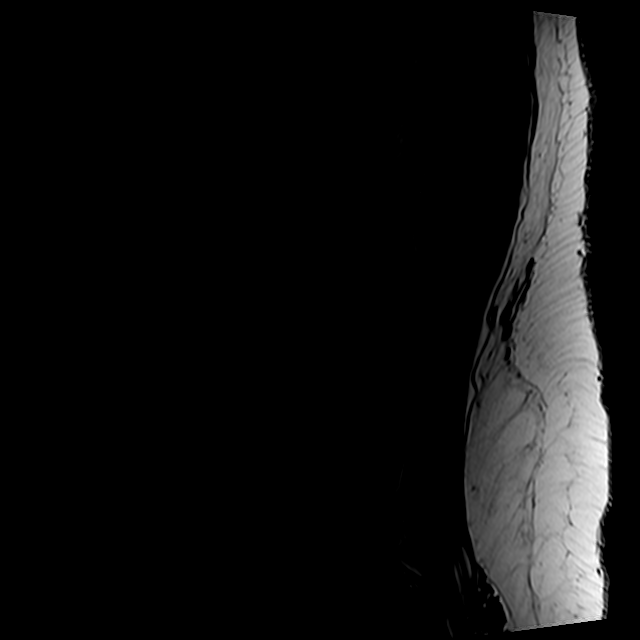

[Series 9: T2 · axial · 4.0mm · 0.78mm/px · z∈[-81,+153]mm · 9 of 41 slices shown (2 of 2)]
[im 1/41]
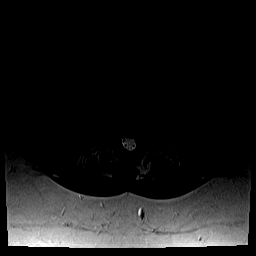
[im 6/41]
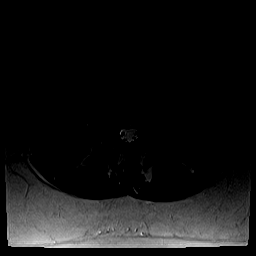
[im 12/41]
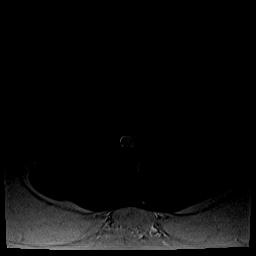
[im 18/41]
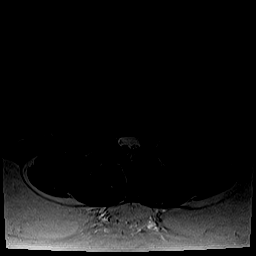
[im 21/41]
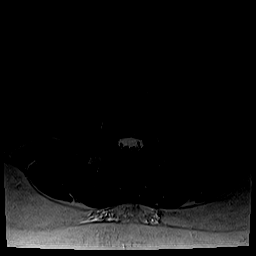
[im 23/41]
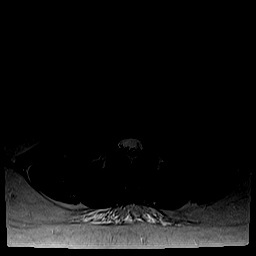
[im 29/41]
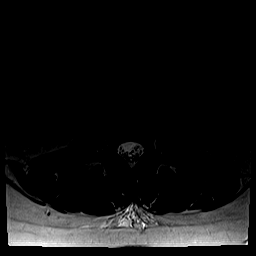
[im 35/41]
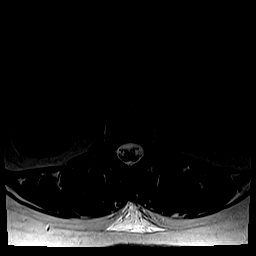
[im 41/41]
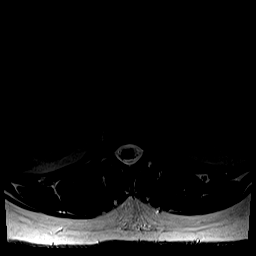

[Series 12: T1 · axial · 4.0mm · 0.39mm/px · z∈[-81,+124]mm · 5 of 41 slices shown (2 of 2)]
[im 1/41]
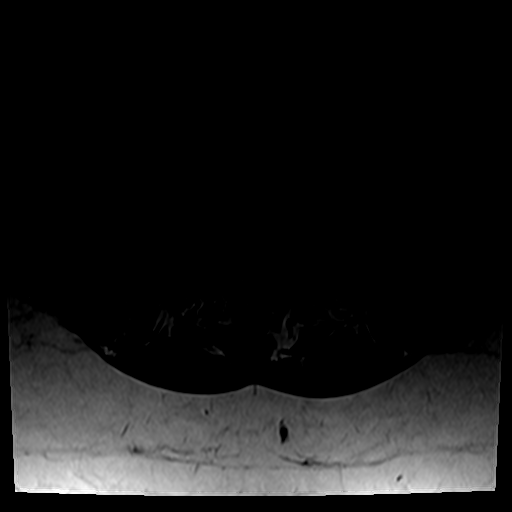
[im 6/41]
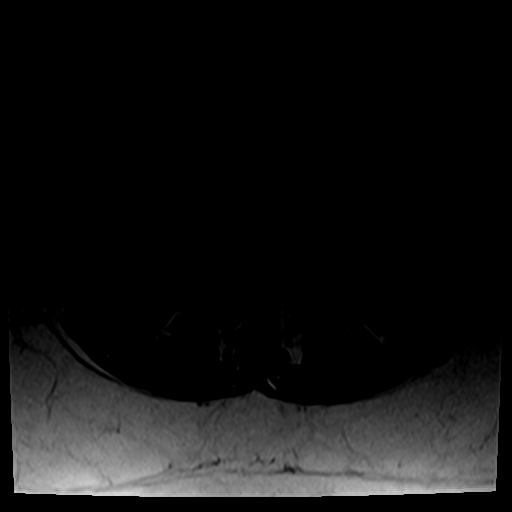
[im 12/41]
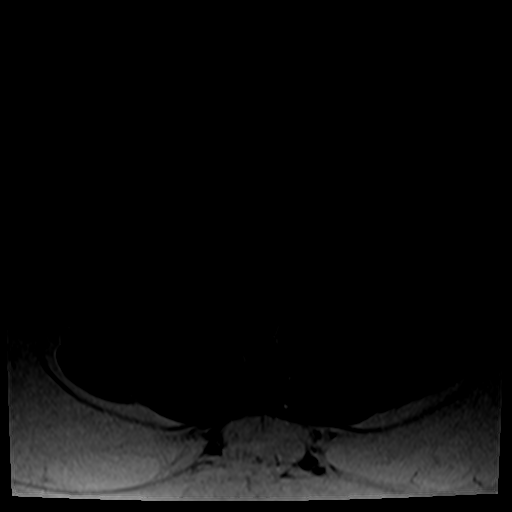
[im 21/41]
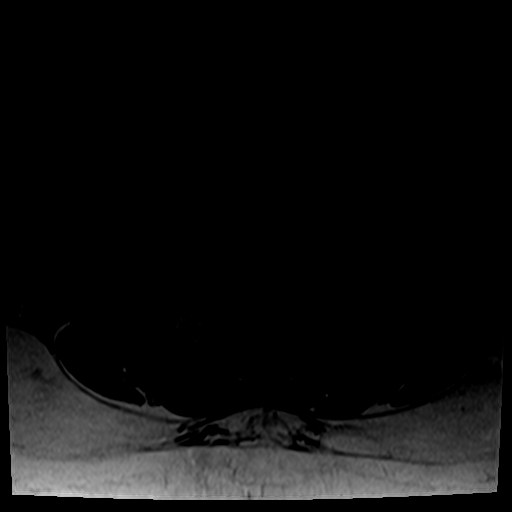
[im 35/41]
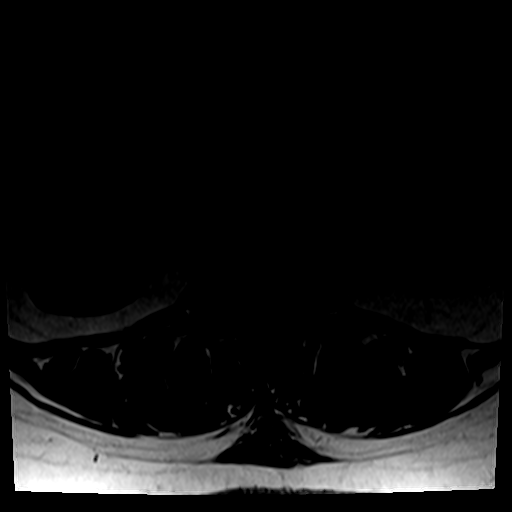

[26 of 48 positions shown; findings below may reference images not displayed]

FINDINGS: Segmentation: There are five lumbar type vertebral bodies. The last
full intervertebral disc space is labeled L5-S1.

Alignment:  Normal

Vertebrae:  Normal marrow signal. No bone lesions or fractures.

Conus medullaris and cauda equina: Conus extends to the bottom of L1
level. Conus and cauda equina appear normal.

Paraspinal and other soft tissues: No significant paraspinal or
retroperitoneal findings identified.

Disc levels:

T12-L1: No significant findings.

L1-2: No significant findings.

L2-3: No significant findings.

L3-4: No significant findings.

L4-5: Mild facet disease but no disc protrusions, spinal or
foraminal stenosis.

L5-S1: Mild disc desiccation and mild degeneration. There is a small
central disc protrusion slightly asymmetric right which contacts the
ventral thecal sac and comes close to contacting the right S1 nerve
root. No foraminal stenosis. Mild facet disease.
IMPRESSION: Small central disc protrusion at L5-S1.

## 2021-09-28 ENCOUNTER — Telehealth: Payer: No Typology Code available for payment source | Admitting: Physician Assistant

## 2021-09-28 DIAGNOSIS — R300 Dysuria: Secondary | ICD-10-CM

## 2021-09-28 MED ORDER — CEPHALEXIN 500 MG PO CAPS: 500.0000 mg | ORAL_CAPSULE | Freq: Two times a day (BID) | ORAL | 0 refills | Status: AC

## 2021-09-28 NOTE — Progress Notes (Signed)

## 2021-09-28 NOTE — Progress Notes (Signed)
I have spent 5 minutes in review of e-visit questionnaire, review and updating patient chart, medical decision making and response to patient.   Egon Dittus Cody Liviah Cake, PA-C    

## 2021-10-12 ENCOUNTER — Telehealth: Payer: No Typology Code available for payment source | Admitting: Nurse Practitioner

## 2021-10-12 DIAGNOSIS — R051 Acute cough: Secondary | ICD-10-CM

## 2021-10-12 MED ORDER — BENZONATATE 100 MG PO CAPS: 100.0000 mg | ORAL_CAPSULE | Freq: Three times a day (TID) | ORAL | 0 refills | Status: DC | PRN

## 2021-10-12 NOTE — Progress Notes (Signed)
We are sorry that you are not feeling well.  Here is how we plan to help! ° °Based on your presentation I believe you most likely have A cough due to a virus.  This is called viral bronchitis and is best treated by rest, plenty of fluids and control of the cough.  You may use Ibuprofen or Tylenol as directed to help your symptoms.   °  °In addition you may use A prescription cough medication called Tessalon Perles 100mg. You may take 1-2 capsules every 8 hours as needed for your cough. ° ° °From your responses in the eVisit questionnaire you describe inflammation in the upper respiratory tract which is causing a significant cough.  This is commonly called Bronchitis and has four common causes:   °Allergies °Viral Infections °Acid Reflux °Bacterial Infection °Allergies, viruses and acid reflux are treated by controlling symptoms or eliminating the cause. An example might be a cough caused by taking certain blood pressure medications. You stop the cough by changing the medication. Another example might be a cough caused by acid reflux. Controlling the reflux helps control the cough. ° °USE OF BRONCHODILATOR ("RESCUE") INHALERS: °There is a risk from using your bronchodilator too frequently.  The risk is that over-reliance on a medication which only relaxes the muscles surrounding the breathing tubes can reduce the effectiveness of medications prescribed to reduce swelling and congestion of the tubes themselves.  Although you feel brief relief from the bronchodilator inhaler, your asthma may actually be worsening with the tubes becoming more swollen and filled with mucus.  This can delay other crucial treatments, such as oral steroid medications. If you need to use a bronchodilator inhaler daily, several times per day, you should discuss this with your provider.  There are probably better treatments that could be used to keep your asthma under control.  °   °HOME CARE °Only take medications as instructed by your  medical team. °Complete the entire course of an antibiotic. °Drink plenty of fluids and get plenty of rest. °Avoid close contacts especially the very young and the elderly °Cover your mouth if you cough or cough into your sleeve. °Always remember to wash your hands °A steam or ultrasonic humidifier can help congestion.  ° °GET HELP RIGHT AWAY IF: °You develop worsening fever. °You become short of breath °You cough up blood. °Your symptoms persist after you have completed your treatment plan °MAKE SURE YOU  °Understand these instructions. °Will watch your condition. °Will get help right away if you are not doing well or get worse. °  ° °Thank you for choosing an e-visit. ° °Your e-visit answers were reviewed by a board certified advanced clinical practitioner to complete your personal care plan. Depending upon the condition, your plan could have included both over the counter or prescription medications. ° °Please review your pharmacy choice. Make sure the pharmacy is open so you can pick up prescription now. If there is a problem, you may contact your provider through MyChart messaging and have the prescription routed to another pharmacy.  Your safety is important to us. If you have drug allergies check your prescription carefully.  ° °For the next 24 hours you can use MyChart to ask questions about today's visit, request a non-urgent call back, or ask for a work or school excuse. °You will get an email in the next two days asking about your experience. I hope that your e-visit has been valuable and will speed your recovery. ° °

## 2021-10-15 ENCOUNTER — Emergency Department (HOSPITAL_BASED_OUTPATIENT_CLINIC_OR_DEPARTMENT_OTHER): Payer: No Typology Code available for payment source | Admitting: Radiology

## 2021-10-15 ENCOUNTER — Encounter (HOSPITAL_BASED_OUTPATIENT_CLINIC_OR_DEPARTMENT_OTHER): Payer: Self-pay | Admitting: Obstetrics and Gynecology

## 2021-10-15 ENCOUNTER — Other Ambulatory Visit: Payer: Self-pay

## 2021-10-15 ENCOUNTER — Emergency Department (HOSPITAL_BASED_OUTPATIENT_CLINIC_OR_DEPARTMENT_OTHER)
Admission: EM | Admit: 2021-10-15 | Discharge: 2021-10-15 | Disposition: A | Discharge status: Home / Self Care | Payer: No Typology Code available for payment source | Attending: Emergency Medicine | Admitting: Emergency Medicine

## 2021-10-15 DIAGNOSIS — R059 Cough, unspecified: Secondary | ICD-10-CM | POA: Diagnosis present

## 2021-10-15 DIAGNOSIS — Z20822 Contact with and (suspected) exposure to covid-19: Secondary | ICD-10-CM | POA: Diagnosis not present

## 2021-10-15 DIAGNOSIS — J101 Influenza due to other identified influenza virus with other respiratory manifestations: Secondary | ICD-10-CM | POA: Insufficient documentation

## 2021-10-15 DIAGNOSIS — F17210 Nicotine dependence, cigarettes, uncomplicated: Secondary | ICD-10-CM | POA: Insufficient documentation

## 2021-10-15 DIAGNOSIS — J45909 Unspecified asthma, uncomplicated: Secondary | ICD-10-CM | POA: Insufficient documentation

## 2021-10-15 LAB — RESP PANEL BY RT-PCR (FLU A&B, COVID) ARPGX2
Influenza A by PCR: POSITIVE — AB
Influenza B by PCR: NEGATIVE
SARS Coronavirus 2 by RT PCR: NEGATIVE

## 2021-10-15 MED ORDER — ACETAMINOPHEN 500 MG PO TABS
1000.0000 mg | ORAL_TABLET | Freq: Once | ORAL | Status: AC
  Administered 2021-10-15: 17:00:00 1000 mg via ORAL
  Filled 2021-10-15: qty 2

## 2021-10-15 NOTE — Discharge Instructions (Addendum)
It was our pleasure to provide your ER care today - we hope that you feel better.  Your flu test is positive.   Rest. Drink plenty of fluids. Take acetaminophen or ibuprofen as need.   Follow up with primary care doctor in 1-2 weeks if symptoms fail to improve/resolve.  Return to ER if worse, increased trouble breathing, or other concern.

## 2021-10-15 NOTE — ED Triage Notes (Signed)
Patient reports to the ER for fever since Friday. Patient states today is the first day she hasn't had one. Patient reports cough and shortness of breath. Patient reports a deep dry cough. Patient reports it is causing pain in her back when she coughs.

## 2021-10-15 NOTE — ED Provider Notes (Signed)
[facility] EMERGENCY DEPT Provider Note   CSN: [id number] Arrival date & time: 10/15/21  1415     History Chief Complaint  Patient presents with   Cough    Sarah Cardenas is a 35 y.o. female.  Patient c/o non prod cough, body aches, fever, in the past few days. Symptoms acute onset, moderate, constant, persistent. No specific known ill contacts or known covid/flu. No severe headaches. No neck stiffness. Mildly sore throat, no trouble breathing or swallowing. No chest pain. No abd pain or nvd. No dysuria or gu c/o. No rash.   The history is provided by the patient and medical records.  Cough Associated symptoms: fever, myalgias and sore throat   Associated symptoms: no chest pain, no headaches, no rash and no shortness of breath       Past Medical History:  Diagnosis Date   Alcohol abuse    Anemia    Anxiety    Asthma    B12 deficiency    Back pain    Bipolar affect, depressed ([place])    Constipation    Depression    Drug use     Patient Active Problem List   Diagnosis Date Noted   Other hyperlipidemia 08/02/2020   Iron deficiency anemia 08/01/2020   Insulin resistance 05/01/2020   Tobacco use disorder 05/01/2020   Vitamin D deficiency 04/17/2020   Bipolar 1 disorder ([place]) 04/17/2020   Class 3 severe obesity with serious comorbidity and body mass index (BMI) of 40.0 to 44.9 in adult ([place]) 12/30/2019   Bipolar 1 disorder, depressed, severe ([place]) 05/26/2019   Cervical intraepithelial neoplasia grade 1 12/07/2018    Past Surgical History:  Procedure Laterality Date   BREAST SURGERY  2011   lump removed   ear drum replacement     right   FRACTURE SURGERY  2004     OB History     Gravida  0   Para  0   Term  0   Preterm  0   AB  0   Living  0      SAB  0   IAB  0   Ectopic  0   Multiple  0   Live Births  0           Family History  Problem Relation Age of Onset   Hyperlipidemia Mother    Thyroid disease Mother     Obesity Mother    Bipolar disorder Father    Drug abuse Father    Depression Father    Anxiety disorder Father    Alcoholism Father    Breast cancer Neg Hx    Colon cancer Neg Hx     Social History   Tobacco Use   Smoking status: Some Days    Packs/day: 0.50    Years: 10.00    Pack years: 5.00    Types: Cigarettes   Smokeless tobacco: Never  Vaping Use   Vaping Use: Never used  Substance Use Topics   Alcohol use: Yes    Comment: occ   Drug use: No    Home Medications Prior to Admission medications   Medication Sig Start Date End Date Taking? Authorizing Provider  albuterol (VENTOLIN HFA) 108 (90 Base) MCG/ACT inhaler Inhale 2 puffs into the lungs every 6 (six) hours as needed for wheezing or shortness of breath. 09/13/20   Inda Coke, PA  benzonatate (TESSALON PERLES) 100 MG capsule Take 1 capsule (100 mg total) by mouth 3 (three)  times daily as needed. 10/12/21   Daphine Cardenas Mary-Margaret, FNP  hydrOXYzine (ATARAX/VISTARIL) 25 MG tablet hydroxyzine HCl 25 mg tablet  TAKE 1/2 TO 1 TABLET BY MOUTH THREE TIMES DAILY AS NEEDED FOR ANXIETY OR PANIC OR AGITATION OR SLEEP    [provider]  ibuprofen (ADVIL,MOTRIN) 200 MG tablet Take 400 mg by mouth every 6 (six) hours as needed.    [provider]  ipratropium (ATROVENT) 0.03 % nasal spray Place 2 sprays into both nostrils every 12 (twelve) hours. 06/06/21   Sarah Merl, PA-C  lamoTRIgine (LAMICTAL) 200 MG tablet Take 200 mg by mouth 2 (two) times daily. 03/05/20   [provider]  metFORMIN (GLUCOPHAGE) 500 MG tablet Take 1 tablet (500 mg total) by mouth 3 (three) times daily. 01/10/21   Cardenas, Sarah Leyland, FNP  sertraline (ZOLOFT) 100 MG tablet TK 1 T PO QAM 01/26/19   [provider]  Vitamin D, Ergocalciferol, (DRISDOL) 1.25 MG (50000 UNIT) CAPS capsule Take 1 capsule (50,000 Units total) by mouth every 7 (seven) days. 01/10/21   Cardenas, Sarah Leyland, FNP  VRAYLAR capsule Take 3 mg by mouth  every morning. 06/04/19   [provider]    Allergies    Ciprofloxacin  Review of Systems   Review of Systems  Constitutional:  Positive for fever.  HENT:  Positive for congestion and sore throat.   Eyes:  Negative for redness.  Respiratory:  Positive for cough. Negative for shortness of breath.   Cardiovascular:  Negative for chest pain.  Gastrointestinal:  Negative for abdominal pain, diarrhea and vomiting.  Genitourinary:  Negative for dysuria and flank pain.  Musculoskeletal:  Positive for myalgias. Negative for neck pain and neck stiffness.  Skin:  Negative for rash.  Neurological:  Negative for headaches.  Hematological:  Does not bruise/bleed easily.  Psychiatric/Behavioral:  Negative for confusion.    Physical Exam Updated Vital Signs BP (!) 142/86 (BP Location: Right Arm)    Pulse (!) 106    Temp 98.4 F (36.9 C)    Resp (!) 24    Ht 1.676 m (5\' 6" )    Wt 122.5 kg    SpO2 98%    BMI 43.58 kg/m   Physical Exam Vitals and nursing note reviewed.  Constitutional:      Appearance: Normal appearance. She is well-developed.  HENT:     Head: Atraumatic.     Right Ear: Tympanic membrane normal.     Left Ear: Tympanic membrane normal.     Nose: Congestion present.     Mouth/Throat:     Mouth: Mucous membranes are moist.     Pharynx: Oropharynx is clear. No oropharyngeal exudate or posterior oropharyngeal erythema.  Eyes:     General: No scleral icterus.    Conjunctiva/sclera: Conjunctivae normal.     Pupils: Pupils are equal, round, and reactive to light.  Neck:     Trachea: No tracheal deviation.     Comments: No stiffness or rigidity.  Cardiovascular:     Rate and Rhythm: Normal rate and regular rhythm.     Pulses: Normal pulses.     Heart sounds: Normal heart sounds. No murmur heard.   No friction rub. No gallop.  Pulmonary:     Effort: Pulmonary effort is normal. No respiratory distress.     Breath sounds: Normal breath sounds.  Abdominal:      General: Bowel sounds are normal. There is no distension.     Palpations: Abdomen is soft.  Tenderness: There is no abdominal tenderness.  Genitourinary:    Comments: No cva tenderness.  Musculoskeletal:        General: No swelling or tenderness.     Cervical back: Normal range of motion and neck supple. No rigidity. No muscular tenderness.     Right lower leg: No edema.     Left lower leg: No edema.  Lymphadenopathy:     Cervical: No cervical adenopathy.  Skin:    General: Skin is warm and dry.     Findings: No rash.  Neurological:     Mental Status: She is alert.     Comments: Alert, speech normal.   Psychiatric:        Mood and Affect: Mood normal.    ED Results / Procedures / Treatments   Labs (all labs ordered are listed, but only abnormal results are displayed) Results for orders placed or performed during the hospital encounter of 10/15/21  Resp Panel by RT-PCR (Flu A&B, Covid) Nasopharyngeal Swab   Specimen: Nasopharyngeal Swab; Nasopharyngeal(NP) swabs in vial transport medium  Result Value Ref Range   SARS Coronavirus 2 by RT PCR NEGATIVE NEGATIVE   Influenza A by PCR POSITIVE (A) NEGATIVE   Influenza B by PCR NEGATIVE NEGATIVE   DG Chest 2 View  Result Date: 10/15/2021 CLINICAL DATA:  Cough and shortness of breath with fever. EXAM: CHEST - 2 VIEW COMPARISON:  None. FINDINGS: The heart size and mediastinal contours are within normal limits. Both lungs are clear. The visualized skeletal structures are unremarkable. IMPRESSION: No active cardiopulmonary disease. Electronically Signed   By: Misty Stanley M.D.   On: 10/15/2021 15:03    EKG None  Radiology DG Chest 2 View  Result Date: 10/15/2021 CLINICAL DATA:  Cough and shortness of breath with fever. EXAM: CHEST - 2 VIEW COMPARISON:  None. FINDINGS: The heart size and mediastinal contours are within normal limits. Both lungs are clear. The visualized skeletal structures are unremarkable. IMPRESSION: No active  cardiopulmonary disease. Electronically Signed   By: Misty Stanley M.D.   On: 10/15/2021 15:03    Procedures Procedures   Medications Ordered in ED Medications - No data to display  ED Course  I have reviewed the triage vital signs and the nursing notes.  Pertinent labs & imaging results that were available during my care of the patient were reviewed by me and considered in my medical decision making (see chart for details).    MDM Rules/Calculators/A&P                         Labs sent. Imaging ordered.   Reviewed nursing notes and prior charts for additional history.   Xrays reviewed/interpreted by me - no pna.   Labs reviewed/interpreted by me - flu positive.   Acetaminophen po.   Discussed results w pt.   Pt appears stable for d/c.      Final Clinical Impression(s) / ED Diagnoses Final diagnoses:  None    Rx / DC Orders ED Discharge Orders     None        Lajean Saver, MD 10/15/21 [phone]

## 2021-10-15 NOTE — ED Notes (Signed)
Pt provided discharge instructions and prescription information. Pt was given the opportunity to ask questions and questions were answered. Discharge signature not obtained in the setting of the COVID-19 pandemic in order to reduce high touch surfaces.  ° °

## 2021-10-17 NOTE — Progress Notes (Signed)
  5-10 minutes spent reviewing and documenting in chart.  Mary-Margaret Dalyn Becker, FNP  

## 2022-01-28 ENCOUNTER — Telehealth: Payer: No Typology Code available for payment source | Admitting: Family Medicine

## 2022-01-28 DIAGNOSIS — R109 Unspecified abdominal pain: Secondary | ICD-10-CM

## 2022-01-28 NOTE — Progress Notes (Signed)
[place]  ? ?Having a lot of stomach pain- advised in person visit to be safe unsure etiology at this time. ? ?Message sent via mychart ?

## 2022-03-19 ENCOUNTER — Encounter: Payer: Self-pay | Admitting: Family Medicine

## 2022-03-20 ENCOUNTER — Ambulatory Visit: Payer: No Typology Code available for payment source | Admitting: Family Medicine

## 2022-03-20 ENCOUNTER — Ambulatory Visit (INDEPENDENT_AMBULATORY_CARE_PROVIDER_SITE_OTHER): Payer: No Typology Code available for payment source

## 2022-03-20 VITALS — BP 112/80 | HR 78 | Ht 66.0 in | Wt 268.0 lb

## 2022-03-20 DIAGNOSIS — M62830 Muscle spasm of back: Secondary | ICD-10-CM

## 2022-03-20 DIAGNOSIS — M5416 Radiculopathy, lumbar region: Secondary | ICD-10-CM | POA: Diagnosis not present

## 2022-03-20 MED ORDER — PREDNISONE 50 MG PO TABS: 50.0000 mg | ORAL_TABLET | Freq: Every day | ORAL | 0 refills | Status: DC

## 2022-03-20 MED ORDER — TIZANIDINE HCL 4 MG PO TABS: 4.0000 mg | ORAL_TABLET | Freq: Four times a day (QID) | ORAL | 1 refills | Status: DC | PRN

## 2022-03-20 MED ORDER — GABAPENTIN 300 MG PO CAPS: 300.0000 mg | ORAL_CAPSULE | Freq: Three times a day (TID) | ORAL | 3 refills | Status: DC | PRN

## 2022-03-20 NOTE — Patient Instructions (Addendum)
Good to see you  ? ?Please get an Xray today before you leave  ? ?I've referred you to Physical Therapy.  Let us know if you don't hear from them in one week.  ? ?Follow up in 6 weeks ?

## 2022-03-20 NOTE — Progress Notes (Signed)
I, Peterson Lombard, LAT, ATC acting as a scribe for Sarah Leader, MD.  Luetta Nutting Amelina Roses is a 36 y.o. female who presents to [place] at [facility] today for LBP. Pt was previously seen by Dr. Georgina Snell on 03/28/21 for R-sided cervical paraspinal musculature and rhomboid spasm and pain. Pt was seen for LBP on 01/26/20 and had a lumbar ESI on 02/02/20. Today, pt c/o LBP since since Saturday.  Pt locates pain to the R-side of her low back w/ radiating pain down the front of both legs to her knees and bilateral hip pain. Patient states this started as a knot in her lower right back and then has progressively gotten worse.   Radiating pain: yes LE numbness/tingling: no LE weakness: yes Aggravates: everything, laying ,walking, sitting.  Treatments tried: extra strength tylenol, topical muscle gel, and ice  Dx imaging: 01/23/20 L-spine MRI  12/30/19 Pelvis & L-spine XR  Pertinent review of systems: No fevers or chills  Relevant historical information: Obesity and bipolar.   Exam:  BP 112/80   Pulse 78   Ht 5\' 6"  (1.676 m)   Wt 268 lb (121.6 kg)   SpO2 99%   BMI 43.26 kg/m  General: Well Developed, well nourished, and in no acute distress.   MSK: L-spine: Nontender midline.  Tender palpation lumbar paraspinal musculature. Decreased lumbar motion. Lower extremity strength is intact.  Has a positive slump test bilaterally.    Lab and Radiology Results  X-ray images L-spine obtained today personally and independently interpreted DDD L5-S1.  No acute fractures are visible. Await formal radiology review    Assessment and Plan: 36 y.o. female with acute low back pain with pain radiating to the anterior thighs bilaterally.  This is consistent with L3 lumbar radiculopathy or an acute exacerbation of muscle spasms lumbar spine with referred pain.  Plan to treat for both with prednisone gabapentin and tizanidine and physical therapy.  If not improving or if worsening proceed to  lumbar spine MRI for epidural steroid injection planning. Recheck in 6 weeks.  PDMP not reviewed this encounter. Orders Placed This Encounter  Procedures   DG Lumbar Spine 2-3 Views    Standing Status:   Future    Number of Occurrences:   1    Standing Expiration Date:   03/21/2023    Order Specific Question:   Reason for Exam (SYMPTOM  OR DIAGNOSIS REQUIRED)    Answer:   eval low back pain    Order Specific Question:   Is patient pregnant?    Answer:   No    Order Specific Question:   Preferred imaging location?    Answer:   [facility]   Ambulatory referral to Physical Therapy    Referral Priority:   Routine    Referral Type:   Physical Medicine    Referral Reason:   Specialty Services Required    Requested Specialty:   Physical Therapy    Number of Visits Requested:   1   Meds ordered this encounter  Medications   predniSONE (DELTASONE) 50 MG tablet    Sig: Take 1 tablet (50 mg total) by mouth daily.    Dispense:  5 tablet    Refill:  0   gabapentin (NEURONTIN) 300 MG capsule    Sig: Take 1 capsule (300 mg total) by mouth 3 (three) times daily as needed.    Dispense:  90 capsule    Refill:  3   tiZANidine (ZANAFLEX) 4 MG  tablet    Sig: Take 1 tablet (4 mg total) by mouth every 6 (six) hours as needed for muscle spasms.    Dispense:  30 tablet    Refill:  1     Discussed warning signs or symptoms. Please see discharge instructions. Patient expresses understanding.   The above documentation has been reviewed and is accurate and complete Sarah Cardenas, M.D.

## 2022-03-22 NOTE — Progress Notes (Signed)
Lumbar spine x-ray shows a little bit of arthritis changes.

## 2022-04-01 ENCOUNTER — Ambulatory Visit: Payer: No Typology Code available for payment source | Admitting: Physical Therapy

## 2022-05-07 ENCOUNTER — Ambulatory Visit: Payer: No Typology Code available for payment source | Admitting: Family Medicine

## 2022-05-29 ENCOUNTER — Encounter (INDEPENDENT_AMBULATORY_CARE_PROVIDER_SITE_OTHER): Payer: Self-pay

## 2022-10-03 ENCOUNTER — Encounter: Payer: Self-pay | Admitting: *Deleted

## 2022-11-14 ENCOUNTER — Ambulatory Visit (INDEPENDENT_AMBULATORY_CARE_PROVIDER_SITE_OTHER): Payer: 59 | Admitting: Neurology

## 2022-11-14 ENCOUNTER — Encounter: Payer: Self-pay | Admitting: Neurology

## 2022-11-14 VITALS — BP 128/81 | HR 87 | Ht 66.0 in | Wt 268.0 lb

## 2022-11-14 DIAGNOSIS — G932 Benign intracranial hypertension: Secondary | ICD-10-CM

## 2022-11-14 DIAGNOSIS — R519 Headache, unspecified: Secondary | ICD-10-CM

## 2022-11-14 DIAGNOSIS — G8929 Other chronic pain: Secondary | ICD-10-CM | POA: Diagnosis not present

## 2022-11-14 DIAGNOSIS — H47339 Pseudopapilledema of optic disc, unspecified eye: Secondary | ICD-10-CM

## 2022-11-14 NOTE — Progress Notes (Signed)
[facility]  PATIENT: Sarah Cardenas DOB: May 15, 1986  REQUESTING CLINICIAN: [facility]* HISTORY FROM: Patient  REASON FOR VISIT: Pseudopapilledema, r/o IIH or space occupying lesion   HISTORICAL  CHIEF COMPLAINT:  Chief Complaint  Patient presents with   New Patient (Initial Visit)    Rm 13 NP Paper referral for Pseudopapilledema Pt denies vision problems c/o daily headaches     HISTORY OF PRESENT ILLNESS:  This is a 37 year old woman past medical history of headaches, anxiety who was referred for by Dr. Sherral Hammers, for a swollen left optic nerve.  Patient reports for the past couple months she has experienced vision change, that she describes as blurry vision.  During the same period also she has been experiencing headaches, frontal headaches.  Patient reports the headaches are basically every day, she will wake up with headaches.  She described as pulsatile 7 out of 10.  Currently she does have a headache.  She does not have any nausea or vomiting associated with the headaches, denies photophobia or phonophobia associated with the headaches.  She has tried over-the-counter medication including Tylenol and ibuprofen.  She denies any previous history of migraines.   Headache History and Characteristics: Onset: 2 months  Location: Frontal  Quality:  pulsative Intensity:7 /10.  Duration: couple hours  Migrainous Features: None  Aura: No  History of brain injury or tumor: No  Family history: Motion sickness: no Cardiac history: no  OTC: Motrin   Prior prophylaxis: Propranolol: No  Verapamil:No TCA: No Topamax: No Depakote: No Effexor: No Cymbalta: No Neurontin:No  Prior abortives: Triptan: No Anti-emetic: No Steroids: No Ergotamine suppository: No    OTHER MEDICAL CONDITIONS: Anxiety    REVIEW OF SYSTEMS: Full 14 system review of systems performed and negative with exception of: As noted in the HPI   ALLERGIES: Allergies   Allergen Reactions   Ciprofloxacin Other (See Comments)    numbness    HOME MEDICATIONS: Outpatient Medications Prior to Visit  Medication Sig Dispense Refill   albuterol (VENTOLIN HFA) 108 (90 Base) MCG/ACT inhaler Inhale 2 puffs into the lungs every 6 (six) hours as needed for wheezing or shortness of breath. 8 g 0   hydrOXYzine (ATARAX/VISTARIL) 25 MG tablet hydroxyzine HCl 25 mg tablet  TAKE 1/2 TO 1 TABLET BY MOUTH THREE TIMES DAILY AS NEEDED FOR ANXIETY OR PANIC OR AGITATION OR SLEEP     gabapentin (NEURONTIN) 300 MG capsule Take 1 capsule (300 mg total) by mouth 3 (three) times daily as needed. 90 capsule 3   lamoTRIgine (LAMICTAL) 200 MG tablet Take 200 mg by mouth 2 (two) times daily.     predniSONE (DELTASONE) 50 MG tablet Take 1 tablet (50 mg total) by mouth daily. 5 tablet 0   sertraline (ZOLOFT) 100 MG tablet TK 1 T PO QAM     tiZANidine (ZANAFLEX) 4 MG tablet Take 1 tablet (4 mg total) by mouth every 6 (six) hours as needed for muscle spasms. 30 tablet 1   No facility-administered medications prior to visit.    PAST MEDICAL HISTORY: Past Medical History:  Diagnosis Date   Alcohol abuse    Anemia    Anxiety    Asthma    B12 deficiency    Back pain    Bipolar affect, depressed ([place])    Constipation    Depression    Drug use     PAST SURGICAL HISTORY: Past Surgical History:  Procedure Laterality Date   BREAST SURGERY  2011  lump removed   ear drum replacement     right   FRACTURE SURGERY  2004    FAMILY HISTORY: Family History  Problem Relation Age of Onset   Hyperlipidemia Mother    Thyroid disease Mother    Obesity Mother    Bipolar disorder Father    Drug abuse Father    Depression Father    Anxiety disorder Father    Alcoholism Father    Breast cancer Neg Hx    Colon cancer Neg Hx     SOCIAL HISTORY: Social History   Socioeconomic History   Marital status: Single    Spouse name: Not on file   Number of children: Not on file   Years  of education: Not on file   Highest education level: Not on file  Occupational History   Occupation: HR/payroll specialist  Tobacco Use   Smoking status: Some Days    Packs/day: 0.50    Years: 10.00    Total pack years: 5.00    Types: Cigarettes   Smokeless tobacco: Never  Vaping Use   Vaping Use: Never used  Substance and Sexual Activity   Alcohol use: Yes    Comment: occ   Drug use: No   Sexual activity: Not Currently    Birth control/protection: Pill  Other Topics Concern   Not on file  Social History Narrative   HR x 6 years   Single, no children   Social Determinants of Health   Financial Resource Strain: Not on file  Food Insecurity: No Food Insecurity (05/28/2019)   Hunger Vital Sign    Worried About Running Out of Food in the Last Year: Never true    Ran Out of Food in the Last Year: Never true  Transportation Needs: No Transportation Needs (05/28/2019)   PRAPARE - Hydrologist (Medical): No    Lack of Transportation (Non-Medical): No  Physical Activity: Inactive (05/28/2019)   Exercise Vital Sign    Days of Exercise per Week: 0 days    Minutes of Exercise per Session: 0 min  Stress: Stress Concern Present (05/28/2019)   [place]    Feeling of Stress : Rather much  Social Connections: Moderately Isolated (05/28/2019)   Social Connection and Isolation Panel [NHANES]    Frequency of Communication with Friends and Family: More than three times a week    Frequency of Social Gatherings with Friends and Family: Once a week    Attends Religious Services: More than 4 times per year    Active Member of [employer] or Organizations: No    Attends Archivist Meetings: Never    Marital Status: Never married  Intimate Partner Violence: Not At Risk (05/28/2019)   Humiliation, Afraid, Rape, and Kick questionnaire    Fear of Current or Ex-Partner: No    Emotionally Abused: No     Physically Abused: No    Sexually Abused: No     PHYSICAL EXAM  GENERAL EXAM/CONSTITUTIONAL: Vitals:  Vitals:   11/14/22 1421  BP: 128/81  Pulse: 87  Weight: 268 lb (121.6 kg)  Height: 5\' 6"  (1.676 m)   Body mass index is 43.26 kg/m. Wt Readings from Last 3 Encounters:  11/14/22 268 lb (121.6 kg)  03/20/22 268 lb (121.6 kg)  10/15/21 270 lb (122.5 kg)   Patient is in no distress; well developed, nourished and groomed; neck is supple  EYES: Pupils round and reactive to light,  Visual fields full to confrontation, Extraocular movements intacts,   MUSCULOSKELETAL: Gait, strength, tone, movements noted in Neurologic exam below  NEUROLOGIC: MENTAL STATUS:      No data to display         awake, alert, oriented to person, place and time recent and remote memory intact normal attention and concentration language fluent, comprehension intact, naming intact fund of knowledge appropriate  CRANIAL NERVE:  2nd - Funduscopic exam reveals a normal right optic disc but there is blurring of the optic disc on the left.  2nd, 3rd, 4th, 6th - pupils equal and reactive to light, visual fields full to confrontation, extraocular muscles intact, no nystagmus 5th - facial sensation symmetric 7th - facial strength symmetric 8th - hearing intact 9th - palate elevates symmetrically, uvula midline 11th - shoulder shrug symmetric 12th - tongue protrusion midline  MOTOR:  normal bulk and tone, full strength in the BUE, BLE  SENSORY:  normal and symmetric to light touch, pinprick  COORDINATION:  finger-nose-finger, fine finger movements normal  REFLEXES:  deep tendon reflexes present and symmetric  GAIT/STATION:  normal     DIAGNOSTIC DATA (LABS, IMAGING, TESTING) - I reviewed patient records, labs, notes, testing and imaging myself where available.  Lab Results  Component Value Date   WBC 8.7 08/01/2020   HGB 13.9 08/01/2020   HCT 42.7 08/01/2020   MCV 88 08/01/2020    PLT 240 08/01/2020      Component Value Date/Time   NA 139 08/01/2020 0744   K 4.6 08/01/2020 0744   CL 103 08/01/2020 0744   CO2 22 08/01/2020 0744   GLUCOSE 82 08/01/2020 0744   GLUCOSE 93 11/16/2019 1352   BUN 8 08/01/2020 0744   CREATININE 0.91 08/01/2020 0744   CALCIUM 9.2 08/01/2020 0744   PROT 6.3 08/01/2020 0744   ALBUMIN 4.2 08/01/2020 0744   AST 16 08/01/2020 0744   ALT 11 08/01/2020 0744   ALKPHOS 93 08/01/2020 0744   BILITOT 0.3 08/01/2020 0744   GFRNONAA 82 08/01/2020 0744   GFRAA 95 08/01/2020 0744   Lab Results  Component Value Date   CHOL 241 (H) 08/01/2020   HDL 51 08/01/2020   LDLCALC 163 (H) 08/01/2020   LDLDIRECT 161.0 12/07/2018   TRIG 149 08/01/2020   CHOLHDL 4.4 02/24/2020   Lab Results  Component Value Date   HGBA1C 5.3 08/01/2020   Lab Results  Component Value Date   VITAMINB12 312 08/01/2020   Lab Results  Component Value Date   TSH 2.350 02/24/2020     ASSESSMENT AND PLAN  37 y.o. year old female with history of anxiety who is presenting with headaches and blurry vision for the past 2 months, found to have a left swollen optic nerve.  I will start by getting MRI brain with and without contrast to rule out space-occupying brain lesion versus finding suggestive of IIH.  If MRI brain negative, we will likely obtain a lumbar puncture.  I will contact patient to go over the result.  Advised her to go to the ED if there is acute worsening of her symptoms.   1. Chronic nonintractable headache, unspecified headache type   2. Pseudopapilledema of optic disc, unspecified laterality   3. IIH (idiopathic intracranial hypertension)     Patient Instructions  MRI brain with and without contrast If negative, we will proceed with lumbar puncture Follow-up in the clinic in 3 months or sooner if worse.   Please present to the ED if there is  acute change in your headaches or any sudden worsening of your vision.   Orders Placed This Encounter   Procedures   MR BRAIN W WO CONTRAST    No orders of the defined types were placed in this encounter.   Return in about 3 months (around 02/13/2023).    Windell Norfolk, MD 11/15/2022, 9:46 AM  [facility] [address], [address] [place], [place] [address] [phone]

## 2022-11-15 ENCOUNTER — Encounter: Payer: Self-pay | Admitting: Neurology

## 2022-11-15 NOTE — Patient Instructions (Signed)
MRI brain with and without contrast If negative, we will proceed with lumbar puncture Follow-up in the clinic in 3 months or sooner if worse.   Please present to the ED if there is acute change in your headaches or any sudden worsening of your vision.

## 2022-11-19 ENCOUNTER — Other Ambulatory Visit: Payer: Self-pay | Admitting: Neurology

## 2022-11-19 ENCOUNTER — Ambulatory Visit: Payer: 59

## 2022-11-19 DIAGNOSIS — H47339 Pseudopapilledema of optic disc, unspecified eye: Secondary | ICD-10-CM

## 2022-11-19 DIAGNOSIS — R519 Headache, unspecified: Secondary | ICD-10-CM

## 2022-11-19 DIAGNOSIS — G932 Benign intracranial hypertension: Secondary | ICD-10-CM

## 2022-11-19 DIAGNOSIS — G8929 Other chronic pain: Secondary | ICD-10-CM | POA: Diagnosis not present

## 2023-02-13 ENCOUNTER — Telehealth: Payer: Self-pay | Admitting: Physician Assistant

## 2023-02-13 DIAGNOSIS — J208 Acute bronchitis due to other specified organisms: Secondary | ICD-10-CM

## 2023-02-13 MED ORDER — BENZONATATE 100 MG PO CAPS: 100.0000 mg | ORAL_CAPSULE | Freq: Three times a day (TID) | ORAL | 0 refills | Status: DC | PRN

## 2023-02-13 MED ORDER — ALBUTEROL SULFATE HFA 108 (90 BASE) MCG/ACT IN AERS: 2.0000 | INHALATION_SPRAY | Freq: Four times a day (QID) | RESPIRATORY_TRACT | 0 refills | Status: DC | PRN

## 2023-02-13 NOTE — Progress Notes (Signed)
I have spent 5 minutes in review of e-visit questionnaire, review and updating patient chart, medical decision making and response to patient.   Starling Jessie Cody Camaryn Lumbert, PA-C    

## 2023-02-13 NOTE — Progress Notes (Signed)
E-Visit for Cough  We are sorry that you are not feeling well.  Here is how we plan to help!  Based on your presentation I believe you most likely have A cough due to a virus.  This is called viral bronchitis and is best treated by rest, plenty of fluids and control of the cough.  You may use Ibuprofen or Tylenol as directed to help your symptoms.     In have sent in both A prescription cough medication called Tessalon Perles . You may take 1-2 capsules every 8 hours as needed for your cough, and an albuterol inhaler to use as directed.     From your responses in the eVisit questionnaire you describe inflammation in the upper respiratory tract which is causing a significant cough.  This is commonly called Bronchitis and has four common causes:   Allergies Viral Infections Acid Reflux Bacterial Infection Allergies, viruses and acid reflux are treated by controlling symptoms or eliminating the cause. An example might be a cough caused by taking certain blood pressure medications. You stop the cough by changing the medication. Another example might be a cough caused by acid reflux. Controlling the reflux helps control the cough.  USE OF BRONCHODILATOR ("RESCUE") INHALERS: There is a risk from using your bronchodilator too frequently.  The risk is that over-reliance on a medication which only relaxes the muscles surrounding the breathing tubes can reduce the effectiveness of medications prescribed to reduce swelling and congestion of the tubes themselves.  Although you feel brief relief from the bronchodilator inhaler, your asthma may actually be worsening with the tubes becoming more swollen and filled with mucus.  This can delay other crucial treatments, such as oral steroid medications. If you need to use a bronchodilator inhaler daily, several times per day, you should discuss this with your provider.  There are probably better treatments that could be used to keep your asthma under control.      HOME CARE Only take medications as instructed by your medical team. Complete the entire course of an antibiotic. Drink plenty of fluids and get plenty of rest. Avoid close contacts especially the very young and the elderly Cover your mouth if you cough or cough into your sleeve. Always remember to wash your hands A steam or ultrasonic humidifier can help congestion.   GET HELP RIGHT AWAY IF: You develop worsening fever. You become short of breath You cough up blood. Your symptoms persist after you have completed your treatment plan MAKE SURE YOU  Understand these instructions. Will watch your condition. Will get help right away if you are not doing well or get worse.    Thank you for choosing an e-visit.  Your e-visit answers were reviewed by a board certified advanced clinical practitioner to complete your personal care plan. Depending upon the condition, your plan could have included both over the counter or prescription medications.  Please review your pharmacy choice. Make sure the pharmacy is open so you can pick up prescription now. If there is a problem, you may contact your provider through [employer] and have the prescription routed to another pharmacy.  Your safety is important to [place]. If you have drug allergies check your prescription carefully.   For the next 24 hours you can use MyChart to ask questions about today's visit, request a non-urgent call back, or ask for a work or school excuse. You will get an email in the next two days asking about your experience. I hope that your  e-visit has been valuable and will speed your recovery.

## 2023-02-27 ENCOUNTER — Encounter: Payer: Self-pay | Admitting: Neurology

## 2023-02-27 ENCOUNTER — Ambulatory Visit (INDEPENDENT_AMBULATORY_CARE_PROVIDER_SITE_OTHER): Payer: 59 | Admitting: Neurology

## 2023-02-27 VITALS — BP 130/76 | Ht 66.0 in | Wt 273.0 lb

## 2023-02-27 DIAGNOSIS — G8929 Other chronic pain: Secondary | ICD-10-CM

## 2023-02-27 DIAGNOSIS — H47339 Pseudopapilledema of optic disc, unspecified eye: Secondary | ICD-10-CM | POA: Diagnosis not present

## 2023-02-27 DIAGNOSIS — R519 Headache, unspecified: Secondary | ICD-10-CM | POA: Diagnosis not present

## 2023-02-27 NOTE — Patient Instructions (Signed)
Lumbar puncture for opening pressure. If elevated will treat as IIH, and if not, will treat a chronic tension vs. Migraine headaches  Follow up with PCP regarding chronic sinusitis seen on MRI  Return in 6 months or sooner if worse

## 2023-02-27 NOTE — Progress Notes (Signed)
[facility]  PATIENT: Sarah Cardenas DOB: 10/05/1986  REQUESTING CLINICIAN: [name], PA HISTORY FROM: Patient  REASON FOR VISIT: Pseudopapilledema, r/o IIH or space occupying lesion   HISTORICAL  CHIEF COMPLAINT:  Chief Complaint  Patient presents with   Follow-up    Rm 13, alone, heachache f/u, headaches are less frequent but same intensity. Only takes Motrin.    INTERVAL HISTORY 02/27/2023 Patient presents for follow-up, last visit was in January.  At that time we obtained an MRI brain which showed no acute abnormality.  Plan at that time was to obtain an LP but it was not done.  She reports that her headache frequency is still the same.  She still having headaches but takes Motrin.  Denies any worsening of her vision, no worsening back pain and no falls.   HISTORY OF PRESENT ILLNESS:  This is a 37 year old woman past medical history of headaches, anxiety who was referred for by Dr. Joseph Art, for a swollen left optic nerve.  Patient reports for the past couple months she has experienced vision change, that she describes as blurry vision.  During the same period also she has been experiencing headaches, frontal headaches.  Patient reports the headaches are basically every day, she will wake up with headaches.  She described as pulsatile 7 out of 10.  Currently she does have a headache.  She does not have any nausea or vomiting associated with the headaches, denies photophobia or phonophobia associated with the headaches.  She has tried over-the-counter medication including Tylenol and ibuprofen.  She denies any previous history of migraines.   Headache History and Characteristics: Onset: 2 months  Location: Frontal  Quality:  pulsative Intensity:7 /10.  Duration: couple hours  Migrainous Features: None  Aura: No  History of brain injury or tumor: No  Family history: Motion sickness: no Cardiac history: no  OTC: Motrin   Prior  prophylaxis: Propranolol: No  Verapamil:No TCA: No Topamax: No Depakote: No Effexor: No Cymbalta: No Neurontin:No  Prior abortives: Triptan: No Anti-emetic: No Steroids: No Ergotamine suppository: No    OTHER MEDICAL CONDITIONS: Anxiety    REVIEW OF SYSTEMS: Full 14 system review of systems performed and negative with exception of: As noted in the HPI   ALLERGIES: Allergies  Allergen Reactions   Ciprofloxacin Other (See Comments)    numbness    HOME MEDICATIONS: Outpatient Medications Prior to Visit  Medication Sig Dispense Refill   albuterol (VENTOLIN HFA) 108 (90 Base) MCG/ACT inhaler Inhale 2 puffs into the lungs every 6 (six) hours as needed for wheezing or shortness of breath. 8 g 0   benzonatate (TESSALON) 100 MG capsule Take 1 capsule (100 mg total) by mouth 3 (three) times daily as needed for cough. 30 capsule 0   hydrOXYzine (ATARAX/VISTARIL) 25 MG tablet hydroxyzine HCl 25 mg tablet  TAKE 1/2 TO 1 TABLET BY MOUTH THREE TIMES DAILY AS NEEDED FOR ANXIETY OR PANIC OR AGITATION OR SLEEP     No facility-administered medications prior to visit.    PAST MEDICAL HISTORY: Past Medical History:  Diagnosis Date   Alcohol abuse    Anemia    Anxiety    Asthma    B12 deficiency    Back pain    Bipolar affect, depressed (HCC)    Constipation    Depression    Drug use     PAST SURGICAL HISTORY: Past Surgical History:  Procedure Laterality Date   BREAST SURGERY  2011   lump removed  ear drum replacement     right   FRACTURE SURGERY  2004    FAMILY HISTORY: Family History  Problem Relation Age of Onset   Hyperlipidemia Mother    Thyroid disease Mother    Obesity Mother    Bipolar disorder Father    Drug abuse Father    Depression Father    Anxiety disorder Father    Alcoholism Father    Breast cancer Neg Hx    Colon cancer Neg Hx     SOCIAL HISTORY: Social History   Socioeconomic History   Marital status: Single    Spouse name: Not on  file   Number of children: Not on file   Years of education: Not on file   Highest education level: Not on file  Occupational History   Occupation: HR/payroll specialist  Tobacco Use   Smoking status: Some Days    Packs/day: 0.50    Years: 10.00    Additional pack years: 0.00    Total pack years: 5.00    Types: Cigarettes   Smokeless tobacco: Never  Vaping Use   Vaping Use: Never used  Substance and Sexual Activity   Alcohol use: Yes    Comment: occ   Drug use: No   Sexual activity: Not Currently    Birth control/protection: Pill  Other Topics Concern   Not on file  Social History Narrative   Lives with roommate   HR x 6 years   Single, no children   Caffeine-16-24oz daily   Social Determinants of Health   Financial Resource Strain: Not on file  Food Insecurity: No Food Insecurity (05/28/2019)   Hunger Vital Sign    Worried About Running Out of Food in the Last Year: Never true    Ran Out of Food in the Last Year: Never true  Transportation Needs: No Transportation Needs (05/28/2019)   PRAPARE - Administrator, Civil Service (Medical): No    Lack of Transportation (Non-Medical): No  Physical Activity: Inactive (05/28/2019)   Exercise Vital Sign    Days of Exercise per Week: 0 days    Minutes of Exercise per Session: 0 min  Stress: Stress Concern Present (05/28/2019)   [employer] of Occupational Health - Occupational Stress Questionnaire    Feeling of Stress : Rather much  Social Connections: Moderately Isolated (05/28/2019)   Social Connection and Isolation Panel [NHANES]    Frequency of Communication with Friends and Family: More than three times a week    Frequency of Social Gatherings with Friends and Family: Once a week    Attends Religious Services: More than 4 times per year    Active Member of [employer] or Organizations: No    Attends Banker Meetings: Never    Marital Status: Never married  Intimate Partner Violence: Not At Risk  (05/28/2019)   Humiliation, Afraid, Rape, and Kick questionnaire    Fear of Current or Ex-Partner: No    Emotionally Abused: No    Physically Abused: No    Sexually Abused: No     PHYSICAL EXAM  GENERAL EXAM/CONSTITUTIONAL: Vitals:  Vitals:   02/27/23 1306  BP: 130/76  Weight: 273 lb (123.8 kg)  Height: 5\' 6"  (1.676 m)   Body mass index is 44.06 kg/m. Wt Readings from Last 3 Encounters:  02/27/23 273 lb (123.8 kg)  11/14/22 268 lb (121.6 kg)  03/20/22 268 lb (121.6 kg)   Patient is in no distress; well developed, nourished and groomed; neck is  supple  MUSCULOSKELETAL: Gait, strength, tone, movements noted in Neurologic exam below  NEUROLOGIC: MENTAL STATUS:      No data to display         awake, alert, oriented to person, place and time recent and remote memory intact normal attention and concentration language fluent, comprehension intact, naming intact fund of knowledge appropriate  CRANIAL NERVE:  2nd - Funduscopic exam reveals a normal right optic disc but there is blurring of the optic disc on the left.  2nd, 3rd, 4th, 6th - pupils equal and reactive to light, visual fields full to confrontation, extraocular muscles intact, no nystagmus 5th - facial sensation symmetric 7th - facial strength symmetric 8th - hearing intact 9th - palate elevates symmetrically, uvula midline 11th - shoulder shrug symmetric 12th - tongue protrusion midline  MOTOR:  normal bulk and tone, full strength in the BUE, BLE  SENSORY:  normal and symmetric to light touch, pinprick  COORDINATION:  finger-nose-finger, fine finger movements normal  REFLEXES:  deep tendon reflexes present and symmetric  GAIT/STATION:  normal   DIAGNOSTIC DATA (LABS, IMAGING, TESTING) - I reviewed patient records, labs, notes, testing and imaging myself where available.  Lab Results  Component Value Date   WBC 8.7 08/01/2020   HGB 13.9 08/01/2020   HCT 42.7 08/01/2020   MCV 88 08/01/2020    PLT 240 08/01/2020      Component Value Date/Time   NA 139 08/01/2020 0744   K 4.6 08/01/2020 0744   CL 103 08/01/2020 0744   CO2 22 08/01/2020 0744   GLUCOSE 82 08/01/2020 0744   GLUCOSE 93 11/16/2019 1352   BUN 8 08/01/2020 0744   CREATININE 0.91 08/01/2020 0744   CALCIUM 9.2 08/01/2020 0744   PROT 6.3 08/01/2020 0744   ALBUMIN 4.2 08/01/2020 0744   AST 16 08/01/2020 0744   ALT 11 08/01/2020 0744   ALKPHOS 93 08/01/2020 0744   BILITOT 0.3 08/01/2020 0744   GFRNONAA 82 08/01/2020 0744   GFRAA 95 08/01/2020 0744   Lab Results  Component Value Date   CHOL 241 (H) 08/01/2020   HDL 51 08/01/2020   LDLCALC 163 (H) 08/01/2020   LDLDIRECT 161.0 12/07/2018   TRIG 149 08/01/2020   CHOLHDL 4.4 02/24/2020   Lab Results  Component Value Date   HGBA1C 5.3 08/01/2020   Lab Results  Component Value Date   VITAMINB12 312 08/01/2020   Lab Results  Component Value Date   TSH 2.350 02/24/2020   MRI Brain 11/19/2022 Unremarkable scan of the brain without contrast. Incidental changes of chronic severe paranasal sinusitis are noted.   ASSESSMENT AND PLAN  37 y.o. year old female with history of anxiety who is presenting with headaches and blurry vision for the past 6 months, found to have a left swollen optic nerve.  MRI brain negative for any acute abnormality, we will proceed with the lumbar puncture.  If elevated will treat her as IIH with Diamox but if normal pressure then we will treat her has migraine versus tension type headache.  This was explained to the patient and she is comfortable with plans.  I will contact her to go over the results.  I also advised her to follow-up with her PCP regarding the chronic sinusitis seen on MRI.  She voices understanding.  Follow-up in 6 months or sooner if worse.    1. Pseudopapilledema of optic disc, unspecified laterality   2. Chronic nonintractable headache, unspecified headache type      Patient Instructions  Lumbar puncture for  opening pressure. If elevated will treat as IIH, and if not, will treat a chronic tension vs. Migraine headaches  Follow up with PCP regarding chronic sinusitis seen on MRI  Return in 6 months or sooner if worse    Orders Placed This Encounter  Procedures   DG FL GUIDED LUMBAR PUNCTURE    No orders of the defined types were placed in this encounter.   Return in about 6 months (around 08/30/2023).    Windell Norfolk, MD 02/27/2023, 5:19 PM  [facility] [address], [address] [place], [place] [address] [phone]

## 2023-03-27 ENCOUNTER — Other Ambulatory Visit: Payer: Self-pay

## 2023-04-02 ENCOUNTER — Ambulatory Visit
Admission: RE | Admit: 2023-04-02 | Discharge: 2023-04-02 | Disposition: A | Discharge status: Home / Self Care | Payer: 59 | Source: Ambulatory Visit | Attending: Neurology | Admitting: Neurology

## 2023-04-02 ENCOUNTER — Other Ambulatory Visit: Payer: Self-pay | Admitting: Neurology

## 2023-04-02 ENCOUNTER — Telehealth: Payer: Self-pay

## 2023-04-02 VITALS — BP 137/85 | HR 61

## 2023-04-02 DIAGNOSIS — R519 Headache, unspecified: Secondary | ICD-10-CM

## 2023-04-02 DIAGNOSIS — H47339 Pseudopapilledema of optic disc, unspecified eye: Secondary | ICD-10-CM

## 2023-04-02 LAB — CSF CELL COUNT WITH DIFFERENTIAL
RBC Count, CSF: 0 cells/uL
TOTAL NUCLEATED CELL: 0 cells/uL (ref 0–5)

## 2023-04-02 LAB — GLUCOSE, CSF: Glucose, CSF: 63 mg/dL (ref 40–80)

## 2023-04-02 LAB — PROTEIN, CSF: Total Protein, CSF: 25 mg/dL (ref 15–45)

## 2023-04-02 MED ORDER — ACETAZOLAMIDE 250 MG PO TABS: 500.0000 mg | ORAL_TABLET | Freq: Two times a day (BID) | ORAL | 11 refills | Status: DC

## 2023-04-02 NOTE — Progress Notes (Signed)
Please call and inform patient that the opening pressure was elevated and consistent with Idiopathic intracranial hypertension. Please inform patient that I will start her with diamox 1 tablet twice daily for one week then increase to 2 tabs twice daily. Please advise her to call us for any adverse effects including paresthesia.

## 2023-04-02 NOTE — Discharge Instructions (Signed)

## 2023-04-02 NOTE — Telephone Encounter (Signed)
Called and relayed message to patient. Pt had no questions at this time but was encouraged to call back if questions arise. Pt verbalized understanding.

## 2023-04-02 NOTE — Telephone Encounter (Signed)
-----   Message from Windell Norfolk, MD sent at 04/02/2023 10:07 AM EDT ----- Please call and inform patient that the opening pressure was elevated and consistent with Idiopathic intracranial hypertension. Please inform patient that I will start her with diamox 1 tablet twice daily for one week then increase to 2 tabs twice daily. Please advise her to call us for any adverse effects including paresthesia.

## 2023-05-28 ENCOUNTER — Telehealth: Payer: 59 | Admitting: Nurse Practitioner

## 2023-05-28 DIAGNOSIS — R3989 Other symptoms and signs involving the genitourinary system: Secondary | ICD-10-CM

## 2023-05-28 MED ORDER — CEPHALEXIN 500 MG PO CAPS: 500.0000 mg | ORAL_CAPSULE | Freq: Two times a day (BID) | ORAL | 0 refills | Status: AC

## 2023-05-28 NOTE — Progress Notes (Signed)

## 2023-08-08 ENCOUNTER — Ambulatory Visit (INDEPENDENT_AMBULATORY_CARE_PROVIDER_SITE_OTHER): Payer: 59 | Admitting: Otolaryngology

## 2023-08-08 ENCOUNTER — Encounter (INDEPENDENT_AMBULATORY_CARE_PROVIDER_SITE_OTHER): Payer: Self-pay

## 2023-08-08 VITALS — Ht 66.0 in | Wt 280.0 lb

## 2023-08-08 DIAGNOSIS — H9011 Conductive hearing loss, unilateral, right ear, with unrestricted hearing on the contralateral side: Secondary | ICD-10-CM

## 2023-08-08 DIAGNOSIS — H7011 Chronic mastoiditis, right ear: Secondary | ICD-10-CM | POA: Diagnosis not present

## 2023-08-09 DIAGNOSIS — H7011 Chronic mastoiditis, right ear: Secondary | ICD-10-CM | POA: Insufficient documentation

## 2023-08-09 DIAGNOSIS — H9011 Conductive hearing loss, unilateral, right ear, with unrestricted hearing on the contralateral side: Secondary | ICD-10-CM | POA: Insufficient documentation

## 2023-08-09 NOTE — Progress Notes (Unsigned)
Patient ID: Sarah Cardenas, female   DOB: [birth year], 37 y.o.   MRN: [id number]  Follow up: Chronic right otomastoiditis  HPI: The patient is a 37 year old female who returns today for her follow-up evaluation. The patient has a history of right ear chronic otomastoiditis.  She underwent right tympanomastoidectomy surgery in 2012.  At her last visit, she was noted to have a moderate amount of squamous debris within the right mastoid cavity. The patient returns today reporting no difficulty since her last visit.  She denies any significant change in her hearing.  She also denies any significant otalgia or otorrhea.    Exam: General: Communicates without difficulty, well nourished, no acute distress. Head: Normocephalic, no evidence injury, no tenderness, facial buttresses intact without stepoff. Face/sinus: No tenderness to palpation and percussion. Facial movement is normal and symmetric. Eyes: PERRL, EOMI. No scleral icterus, conjunctivae clear. Neuro: CN II exam reveals vision grossly intact.  No nystagmus at any point of gaze. Ears: Auricles well formed without lesions.  A large right mastoid bowl is noted. A moderate amount of squamous debris is noted within the mastoid bowl. Under the operating microscope, the squamous debris is carefully removed with a combination of suction catheters, cerumen curette, and alligator forceps. After the debridement procedure, the mastoid bowl is noted to be healthy with no evidence of infection or cholesteatoma.  Nose: External evaluation reveals normal support and skin without lesions.  Dorsum is intact.  Anterior rhinoscopy reveals congested mucosa over anterior aspect of inferior turbinates and intact septum.  No purulence noted. Oral:  Oral cavity and oropharynx are intact, symmetric, without erythema or edema.  Mucosa is moist without lesions. Neck: Full range of motion without pain.  There is no significant lymphadenopathy.  No masses palpable.  Thyroid bed within  normal limits to palpation.  Parotid glands and submandibular glands equal bilaterally without mass.  Trachea is midline. Neuro:  CN 2-12 grossly intact.    Assessment: 1.  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  After the debridement procedure, no infection or recurrent cholesteatoma is noted.  2.  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   Plan: 1.  Otomicroscopy with debridement of the right mastoid cavity.  2.  The patient should observe dry ear precaution on the right side.  3.  The patient will return for re-evaluation in 12 months.

## 2023-09-03 ENCOUNTER — Telehealth: Payer: 59 | Admitting: Family Medicine

## 2023-09-03 DIAGNOSIS — R051 Acute cough: Secondary | ICD-10-CM | POA: Diagnosis not present

## 2023-09-03 MED ORDER — ALBUTEROL SULFATE HFA 108 (90 BASE) MCG/ACT IN AERS
2.0000 | INHALATION_SPRAY | Freq: Four times a day (QID) | RESPIRATORY_TRACT | 0 refills | Status: DC | PRN
Start: 2023-09-03 — End: 2024-08-16

## 2023-09-03 MED ORDER — BENZONATATE 100 MG PO CAPS: 100.0000 mg | ORAL_CAPSULE | Freq: Three times a day (TID) | ORAL | 0 refills | Status: DC | PRN

## 2023-09-03 NOTE — Progress Notes (Signed)
E-Visit for Cough   We are sorry that you are not feeling well.  Here is how we plan to help!  Based on your presentation I believe you most likely have A cough due to a virus.  This is called viral bronchitis and is best treated by rest, plenty of fluids and control of the cough.  You may use Ibuprofen or Tylenol as directed to help your symptoms.     In addition you may use A prescription cough medication called Tessalon Perles 100mg . You may take 1-2 capsules every 8 hours as needed for your cough.  I will refill your inhaler as well.   Please work to reduce smoking so that you can stop it. That will help you greatly.   From your responses in the eVisit questionnaire you describe inflammation in the upper respiratory tract which is causing a significant cough.  This is commonly called Bronchitis and has four common causes:   Allergies Viral Infections Acid Reflux Bacterial Infection Allergies, viruses and acid reflux are treated by controlling symptoms or eliminating the cause. An example might be a cough caused by taking certain blood pressure medications. You stop the cough by changing the medication. Another example might be a cough caused by acid reflux. Controlling the reflux helps control the cough.  USE OF BRONCHODILATOR ("RESCUE") INHALERS: There is a risk from using your bronchodilator too frequently.  The risk is that over-reliance on a medication which only relaxes the muscles surrounding the breathing tubes can reduce the effectiveness of medications prescribed to reduce swelling and congestion of the tubes themselves.  Although you feel brief relief from the bronchodilator inhaler, your asthma may actually be worsening with the tubes becoming more swollen and filled with mucus.  This can delay other crucial treatments, such as oral steroid medications. If you need to use a bronchodilator inhaler daily, several times per day, you should discuss this with your provider.  There are  probably better treatments that could be used to keep your asthma under control.     HOME CARE Only take medications as instructed by your medical team. Complete the entire course of an antibiotic. Drink plenty of fluids and get plenty of rest. Avoid close contacts especially the very young and the elderly Cover your mouth if you cough or cough into your sleeve. Always remember to wash your hands A steam or ultrasonic humidifier can help congestion.   GET HELP RIGHT AWAY IF: You develop worsening fever. You become short of breath You cough up blood. Your symptoms persist after you have completed your treatment plan MAKE SURE YOU  Understand these instructions. Will watch your condition. Will get help right away if you are not doing well or get worse.    Thank you for choosing an e-visit.  Your e-visit answers were reviewed by a board certified advanced clinical practitioner to complete your personal care plan. Depending upon the condition, your plan could have included both over the counter or prescription medications.  Please review your pharmacy choice. Make sure the pharmacy is open so you can pick up prescription now. If there is a problem, you may contact your provider through [employer] and have the prescription routed to another pharmacy.  Your safety is important to [place]. If you have drug allergies check your prescription carefully.   For the next 24 hours you can use MyChart to ask questions about today's visit, request a non-urgent call back, or ask for a work or school excuse. You  will get an email in the next two days asking about your experience. I hope that your e-visit has been valuable and will speed your recovery.  I provided 5 minutes of non face-to-face time during this encounter for chart review, medication and order placement, as well as and documentation.

## 2023-09-11 ENCOUNTER — Ambulatory Visit: Payer: 59 | Admitting: Neurology

## 2023-10-30 ENCOUNTER — Telehealth: Payer: 59 | Admitting: Physician Assistant

## 2023-10-30 DIAGNOSIS — R0602 Shortness of breath: Secondary | ICD-10-CM

## 2023-10-30 DIAGNOSIS — R079 Chest pain, unspecified: Secondary | ICD-10-CM

## 2023-10-30 DIAGNOSIS — R6889 Other general symptoms and signs: Secondary | ICD-10-CM

## 2023-10-30 NOTE — Progress Notes (Signed)
 Because of chest pain and shortness of breath along with these other symptoms and need for exam and assessment of oxygen levels, I feel your condition warrants further evaluation and I recommend that you be seen in a face to face visit.   NOTE: There will be NO CHARGE for this eVisit   If you are having a true medical emergency please call 911.      For an urgent face to face visit, [facility] has eight urgent care centers for your convenience:   NEW!! [facility] Health Urgent Care Center at [facility] Get Driving Directions [phone] [address], [address] [place], [address]    [facility] Health Urgent Care Center at [facility] Get Driving Directions [phone] [address] [place], [place] [address]   [facility] Health Urgent Care Center [facility]) Get Driving Directions [phone] [address] [place], [place] [address]  [facility] Health Urgent Care Center [facility]) Get Driving Directions [phone] [address] [place],  [place]  [address]  [facility] Health Urgent Care Center [facility] - at [employer]  [phone] [address] [place],  [place] [address]   [facility] Health Urgent Care at [facility] Get Driving Directions [phone] [address], [address] [place], [place] [address]   [facility] Health Urgent Care at [facility] Get Driving Directions  [phone] [address] [place], [place] [address]   [facility] Health Urgent Care at [facility] Directions [phone] [address], [address] [place], [place] [address]  Your MyChart E-visit questionnaire answers were reviewed by a board certified advanced clinical practitioner to complete your personal care plan based on your specific symptoms.  Thank you for using e-Visits.

## 2023-12-02 ENCOUNTER — Telehealth: Payer: 59 | Admitting: Physician Assistant

## 2023-12-02 DIAGNOSIS — R079 Chest pain, unspecified: Secondary | ICD-10-CM

## 2023-12-02 NOTE — Progress Notes (Signed)

## 2023-12-02 NOTE — Progress Notes (Signed)
Message sent to patient requesting further input regarding current symptoms. Awaiting patient response.

## 2024-05-07 ENCOUNTER — Encounter: Payer: Self-pay | Admitting: Advanced Practice Midwife

## 2024-05-25 ENCOUNTER — Telehealth: Payer: Self-pay | Admitting: Physician Assistant

## 2024-05-25 NOTE — Telephone Encounter (Signed)
 Please call patient and work her in for new patient appointment to re-establish care at her convenience.  May overbook session limits.  Sarah Cardenas

## 2024-05-28 ENCOUNTER — Telehealth: Admitting: Nurse Practitioner

## 2024-05-28 DIAGNOSIS — J40 Bronchitis, not specified as acute or chronic: Secondary | ICD-10-CM

## 2024-05-28 MED ORDER — ALBUTEROL SULFATE HFA 108 (90 BASE) MCG/ACT IN AERS: 2.0000 | INHALATION_SPRAY | Freq: Four times a day (QID) | RESPIRATORY_TRACT | 0 refills | Status: AC | PRN

## 2024-05-28 MED ORDER — BENZONATATE 100 MG PO CAPS: 100.0000 mg | ORAL_CAPSULE | Freq: Three times a day (TID) | ORAL | 0 refills | Status: DC | PRN

## 2024-05-28 MED ORDER — PREDNISONE 20 MG PO TABS: 20.0000 mg | ORAL_TABLET | Freq: Two times a day (BID) | ORAL | 0 refills | Status: AC

## 2024-05-28 NOTE — Progress Notes (Signed)
 E-Visit for Cough   We are sorry that you are not feeling well.  Here is how we plan to help!  Based on your presentation I believe you most likely have A cough due to a virus.  This is called viral bronchitis and is best treated by rest, plenty of fluids and control of the cough.  You may use Ibuprofen or Tylenol  as directed to help your symptoms.     In addition you may use A prescription cough medication called Tessalon  Perles 100mg . You may take 1-2 capsules every 8 hours as needed for your cough.  Meds ordered this encounter  Medications   predniSONE  (DELTASONE ) 20 MG tablet    Sig: Take 1 tablet (20 mg total) by mouth 2 (two) times daily with a meal for 5 days.    Dispense:  10 tablet    Refill:  0   albuterol  (VENTOLIN  HFA) 108 (90 Base) MCG/ACT inhaler    Sig: Inhale 2 puffs into the lungs every 6 (six) hours as needed for wheezing or shortness of breath.    Dispense:  8 g    Refill:  0     From your responses in the eVisit questionnaire you describe inflammation in the upper respiratory tract which is causing a significant cough.  This is commonly called Bronchitis and has four common causes:   Allergies Viral Infections Acid Reflux Bacterial Infection Allergies, viruses and acid reflux are treated by controlling symptoms or eliminating the cause. An example might be a cough caused by taking certain blood pressure medications. You stop the cough by changing the medication. Another example might be a cough caused by acid reflux. Controlling the reflux helps control the cough.  USE OF BRONCHODILATOR (RESCUE) INHALERS: There is a risk from using your bronchodilator too frequently.  The risk is that over-reliance on a medication which only relaxes the muscles surrounding the breathing tubes can reduce the effectiveness of medications prescribed to reduce swelling and congestion of the tubes themselves.  Although you feel brief relief from the bronchodilator inhaler, your asthma  may actually be worsening with the tubes becoming more swollen and filled with mucus.  This can delay other crucial treatments, such as oral steroid medications. If you need to use a bronchodilator inhaler daily, several times per day, you should discuss this with your provider.  There are probably better treatments that could be used to keep your asthma under control.     HOME CARE Only take medications as instructed by your medical team. Complete the entire course of an antibiotic. Drink plenty of fluids and get plenty of rest. Avoid close contacts especially the very young and the elderly Cover your mouth if you cough or cough into your sleeve. Always remember to wash your hands A steam or ultrasonic humidifier can help congestion.   GET HELP RIGHT AWAY IF: You develop worsening fever. You become short of breath You cough up blood. Your symptoms persist after you have completed your treatment plan MAKE SURE YOU  Understand these instructions. Will watch your condition. Will get help right away if you are not doing well or get worse.    Thank you for choosing an e-visit.  Your e-visit answers were reviewed by a board certified advanced clinical practitioner to complete your personal care plan. Depending upon the condition, your plan could have included both over the counter or prescription medications.  Please review your pharmacy choice. Make sure the pharmacy is open so you can pick up  prescription now. If there is a problem, you may contact your provider through [employer] and have the prescription routed to another pharmacy.  Your safety is important to us . If you have drug allergies check your prescription carefully.   For the next 24 hours you can use MyChart to ask questions about today's visit, request a non-urgent call back, or ask for a work or school excuse. You will get an email in the next two days asking about your experience. I hope that your e-visit has been  valuable and will speed your recovery.   I spent approximately 5 minutes reviewing the patient's history, current symptoms and coordinating their care today.

## 2024-05-28 NOTE — Addendum Note (Signed)
 Addended by: KENNYTH LAURAINE BRAVO on: 05/28/2024 08:53 AM   Modules accepted: Orders

## 2024-06-29 ENCOUNTER — Encounter: Payer: Self-pay | Admitting: Physician Assistant

## 2024-06-29 ENCOUNTER — Ambulatory Visit: Admitting: Physician Assistant

## 2024-06-29 VITALS — BP 136/90 | HR 84 | Temp 97.5°F | Ht 66.0 in | Wt 270.5 lb

## 2024-06-29 DIAGNOSIS — F419 Anxiety disorder, unspecified: Secondary | ICD-10-CM

## 2024-06-29 DIAGNOSIS — Z0000 Encounter for general adult medical examination without abnormal findings: Secondary | ICD-10-CM | POA: Diagnosis not present

## 2024-06-29 DIAGNOSIS — E559 Vitamin D deficiency, unspecified: Secondary | ICD-10-CM | POA: Diagnosis not present

## 2024-06-29 DIAGNOSIS — E538 Deficiency of other specified B group vitamins: Secondary | ICD-10-CM | POA: Diagnosis not present

## 2024-06-29 DIAGNOSIS — Z114 Encounter for screening for human immunodeficiency virus [HIV]: Secondary | ICD-10-CM

## 2024-06-29 DIAGNOSIS — Z6841 Body Mass Index (BMI) 40.0 and over, adult: Secondary | ICD-10-CM | POA: Diagnosis not present

## 2024-06-29 DIAGNOSIS — Z720 Tobacco use: Secondary | ICD-10-CM

## 2024-06-29 DIAGNOSIS — E669 Obesity, unspecified: Secondary | ICD-10-CM

## 2024-06-29 DIAGNOSIS — Z1159 Encounter for screening for other viral diseases: Secondary | ICD-10-CM

## 2024-06-29 LAB — CBC WITH DIFFERENTIAL/PLATELET
Basophils Absolute: 0.1 K/uL (ref 0.0–0.1)
Basophils Relative: 0.8 % (ref 0.0–3.0)
Eosinophils Absolute: 0.3 K/uL (ref 0.0–0.7)
Eosinophils Relative: 4.9 % (ref 0.0–5.0)
HCT: 42 % (ref 36.0–46.0)
Hemoglobin: 13.9 g/dL (ref 12.0–15.0)
Lymphocytes Relative: 32.3 % (ref 12.0–46.0)
Lymphs Abs: 2.2 K/uL (ref 0.7–4.0)
MCHC: 33.1 g/dL (ref 30.0–36.0)
MCV: 87.7 fl (ref 78.0–100.0)
Monocytes Absolute: 0.4 K/uL (ref 0.1–1.0)
Monocytes Relative: 6 % (ref 3.0–12.0)
Neutro Abs: 3.8 K/uL (ref 1.4–7.7)
Neutrophils Relative %: 56 % (ref 43.0–77.0)
Platelets: 231 K/uL (ref 150.0–400.0)
RBC: 4.79 Mil/uL (ref 3.87–5.11)
RDW: 13.5 % (ref 11.5–15.5)
WBC: 6.7 K/uL (ref 4.0–10.5)

## 2024-06-29 LAB — COMPREHENSIVE METABOLIC PANEL WITH GFR
ALT: 15 U/L (ref 0–35)
AST: 14 U/L (ref 0–37)
Albumin: 4.3 g/dL (ref 3.5–5.2)
Alkaline Phosphatase: 68 U/L (ref 39–117)
BUN: 13 mg/dL (ref 6–23)
CO2: 27 meq/L (ref 19–32)
Calcium: 9.4 mg/dL (ref 8.4–10.5)
Chloride: 105 meq/L (ref 96–112)
Creatinine, Ser: 0.96 mg/dL (ref 0.40–1.20)
GFR: 74.63 mL/min (ref >60.00)
Glucose, Bld: 85 mg/dL (ref 70–99)
Potassium: 4.6 meq/L (ref 3.5–5.1)
Sodium: 140 meq/L (ref 135–145)
Total Bilirubin: 0.7 mg/dL (ref 0.2–1.2)
Total Protein: 6.7 g/dL (ref 6.0–8.3)

## 2024-06-29 LAB — IBC + FERRITIN
Ferritin: 42.2 ng/mL (ref 10.0–291.0)
Iron: 98 ug/dL (ref 42–145)
Saturation Ratios: 26.3 % (ref 20.0–50.0)
TIBC: 372.4 ug/dL (ref 250.0–450.0)
Transferrin: 266 mg/dL (ref 212.0–360.0)

## 2024-06-29 LAB — LIPID PANEL
Cholesterol: 234 mg/dL — ABNORMAL HIGH (ref 0–200)
HDL: 52.4 mg/dL (ref >39.00)
LDL Cholesterol: 155 mg/dL — ABNORMAL HIGH (ref 0–99)
NonHDL: 181.53
Total CHOL/HDL Ratio: 4
Triglycerides: 134 mg/dL (ref 0.0–149.0)
VLDL: 26.8 mg/dL (ref 0.0–40.0)

## 2024-06-29 LAB — VITAMIN D 25 HYDROXY (VIT D DEFICIENCY, FRACTURES): VITD: 16.9 ng/mL — ABNORMAL LOW (ref 30.00–100.00)

## 2024-06-29 LAB — VITAMIN B12: Vitamin B-12: 187 pg/mL — ABNORMAL LOW (ref 211–911)

## 2024-06-29 MED ORDER — BUSPIRONE HCL 10 MG PO TABS: 10.0000 mg | ORAL_TABLET | Freq: Three times a day (TID) | ORAL | 1 refills | Status: AC | PRN

## 2024-06-29 MED ORDER — ZEPBOUND 2.5 MG/0.5ML ~~LOC~~ SOAJ: 2.5000 mg | SUBCUTANEOUS | 2 refills | Status: DC

## 2024-06-29 NOTE — Progress Notes (Signed)
 Subjective:    Sarah Cardenas is a 38 y.o. female and is here for a comprehensive physical exam.  HPI  Health Maintenance Due  Topic Date Due   HIV Screening  Never done   Hepatitis C Screening  Never done    Discussed the use of AI scribe software for clinical note transcription with the patient, who gave verbal consent to proceed.  History of Present Illness Sarah Cardenas is a 38 year old female who presents for Comprehensive Physical Exam (CPE) preventive care annual visit and concerns about weight loss and overall health.  She is experiencing difficulty with weight loss and is considering restarting treatment, including weight loss injections like Zepbound . She has not had blood work done in over two years and seeks to update this today Increased anxiety is present due to situational stressors, including mold issues in her home and a recent breakup. She prefers as-needed anxiety medication over daily options. She has recently restarted talk therapy.  She is currently walking for 20-30 minutes daily in efforts to lose weight. She also has reduced her portion sizes of carbohydrates and worked on increasing protein intake.  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 Menopausal symptoms include hot flashes and night sweats, with no menstrual period in five years. Sleep quality is poor, with difficulty staying asleep for the past month and a half, attributed to stress.   She smokes about a quarter of a pack a day and is trying to quit, having reduced to ultra-light cigarettes.   Health Maintenance: Immunizations -- UpToDate Colonoscopy -- 09/2022 Mammogram -- n/a PAP -- overdue, requesting records Bone Density -- n/a Diet -- eating better, more healthy Exercise -- walk  Sleep habits -- trouble staying asleep Mood -- ongoing anxiety -- see above  UTD with dentist? - yes UTD with eye doctor? - yes  Weight history: Wt Readings from Last  10 Encounters:  06/29/24 270 lb 8 oz (122.7 kg)  08/08/23 280 lb (127 kg)  02/27/23 273 lb (123.8 kg)  11/14/22 268 lb (121.6 kg)  03/20/22 268 lb (121.6 kg)  10/15/21 270 lb (122.5 kg)  03/28/21 272 lb 3.2 oz (123.5 kg)  02/06/21 270 lb 15.1 oz (122.9 kg)  01/10/21 271 lb (122.9 kg)  12/26/20 266 lb (120.7 kg)   Body mass index is 43.66 kg/m. No LMP recorded. Patient is postmenopausal.  Alcohol use:  reports current alcohol use.  Tobacco use:  Tobacco Use: High Risk (06/29/2024)   Patient History    Smoking Tobacco Use: Every Day    Smokeless Tobacco Use: Never    Passive Exposure: Not on file   Eligible for lung cancer screening? no     06/29/2024    8:44 AM  Depression screen PHQ 2/9  Decreased Interest 1  Down, Depressed, Hopeless 1  PHQ - 2 Score 2  Altered sleeping 2  Tired, decreased energy 1  Change in appetite 1  Feeling bad or failure about yourself  1  Trouble concentrating 0  Moving slowly or fidgety/restless 0  Suicidal thoughts 0  PHQ-9 Score 7  Difficult doing work/chores Not difficult at all     Other providers/specialists: Patient Care Team: Job Lukes, [place] as PCP - General (Physician Assistant) Sheldon Standing, MD as Consulting Physician (General Surgery) Job Lukes, [place] as Physician Assistant (Physician Assistant) Dermatology, [facility] as Consulting Physician [place], Heather, OD as Consulting Physician (Optometry) Key, Hargis HERO, NP as Nurse Practitioner (Gynecology)  PMHx, SurgHx, SocialHx, Medications, and Allergies were reviewed in the Visit Navigator and updated as appropriate.   Past Medical History:  Diagnosis Date   Alcohol abuse    Anemia    Anxiety    Asthma    B12 deficiency    Back pain    Bipolar affect, depressed (HCC)    Constipation    Depression    Drug use      Past Surgical History:  Procedure Laterality Date   BREAST SURGERY  2011   lump removed   ear drum replacement     right   FRACTURE SURGERY   2004     Family History  Problem Relation Age of Onset   Hyperlipidemia Mother    Thyroid disease Mother    Obesity Mother    Bipolar disorder Father    Drug abuse Father    Depression Father    Anxiety disorder Father    Alcoholism Father    Thyroid disease Sister    Cancer Maternal Grandmother    Lung cancer Maternal Grandfather    Neuropathy Paternal Grandmother    Breast cancer Neg Hx    Colon cancer Neg Hx     Social History   Tobacco Use   Smoking status: Every Day    Average packs/day: 0.3 packs/day for 10.0 years (2.5 ttl pk-yrs)    Types: Cigarettes    Start date: 2002   Smokeless tobacco: Never  Vaping Use   Vaping status: Never Used  Substance Use Topics   Alcohol use: Yes    Comment: occasional; social   Drug use: No    Review of Systems:   Review of Systems  Constitutional:  Negative for chills, fever, malaise/fatigue and weight loss.  HENT:  Negative for hearing loss, sinus pain and sore throat.   Respiratory:  Negative for cough and hemoptysis.   Cardiovascular:  Negative for chest pain, palpitations, leg swelling and PND.  Gastrointestinal:  Negative for abdominal pain, constipation, diarrhea, heartburn, nausea and vomiting.  Genitourinary:  Negative for dysuria, frequency and urgency.  Musculoskeletal:  Negative for back pain, myalgias and neck pain.  Skin:  Negative for itching and rash.  Neurological:  Negative for dizziness, tingling, seizures and headaches.  Endo/Heme/Allergies:  Negative for polydipsia.  Psychiatric/Behavioral:  Negative for depression. The patient is nervous/anxious.     Objective:   BP (!) 136/90 (BP Location: Left Arm, Patient Position: Sitting, Cuff Size: Large)   Pulse 84   Temp (!) 97.5 F (36.4 C) (Temporal)   Ht 5' 6 (1.676 m)   Wt 270 lb 8 oz (122.7 kg)   SpO2 95%   BMI 43.66 kg/m  Body mass index is 43.66 kg/m.   General Appearance:    Alert, cooperative, no distress, appears stated age  Head:     Normocephalic, without obvious abnormality, atraumatic  Eyes:    PERRL, conjunctiva/corneas clear, EOM's intact, fundi    benign, both eyes  Ears:    Normal TM's and external ear canals, both ears  Nose:   Nares normal, septum midline, mucosa normal, no drainage    or sinus tenderness  Throat:   Lips, mucosa, and tongue normal; teeth and gums normal  Neck:   Supple, symmetrical, trachea midline, no adenopathy;    thyroid:  no enlargement/tenderness/nodules; no carotid   bruit or JVD  Back:     Symmetric, no curvature, ROM normal, no CVA tenderness  Lungs:     Clear to auscultation bilaterally, respirations  unlabored  Chest Wall:    No tenderness or deformity   Heart:    Regular rate and rhythm, S1 and S2 normal, no murmur, rub or gallop  Breast Exam:    Deferred  Abdomen:     Soft, non-tender, bowel sounds active all four quadrants,    no masses, no organomegaly  Genitalia:    Deferred   Extremities:   Extremities normal, atraumatic, no cyanosis or edema  Pulses:   2+ and symmetric all extremities  Skin:   Skin color, texture, turgor normal, no rashes or lesions  Lymph nodes:   Cervical, supraclavicular, and axillary nodes normal  Neurologic:   CNII-XII intact, normal strength, sensation and reflexes    throughout    Assessment/Plan:   Assessment and Plan Assessment & Plan Adult Wellness Visit Routine wellness visit with slightly elevated blood pressure, no medication needed. No recent gynecological visit. - Order blood work. - Encourage follow-up with gynecologist.  Obesity Obesity with interest in weight loss options, including GLP-1 receptor agonists. Discussed benefits, side effects, and insurance coverage of medications like Zepbound  and Wegovy. Provided voucher for Zepbound . - Prescribe Zepbound  if covered by insurance. - Provide voucher for Zepbound . - Discuss potential for oral option in the future. - Encourage physical activity and mindful eating. - Schedule follow-up  in 3 months.  Anxiety disorder Experiencing anxiety due to situational stressors. Prefers as-needed medication. Discussed starting Buspar  for its mild nature and lack of withdrawal symptoms. - Prescribe Buspar  for as-needed use. - Continue therapy sessions.  Vitamin D  and B12 deficiency She would like to recheck these levels and consider supplementation if needed   Tobacco use disorder Long-term tobacco use, currently smoking a quarter pack per day. Actively trying to quit. Discussed potential benefits of GLP-1 receptor agonists in reducing cravings. - Encourage smoking cessation efforts. - Discuss potential benefits of GLP-1 receptor agonists in reducing cravings.   Follow up in 3 month(s), sooner if concerns   Lucie Buttner, PA-C [facility]

## 2024-06-29 NOTE — Patient Instructions (Addendum)
  VISIT SUMMARY: During your visit, we discussed your concerns about weight loss, anxiety, menopausal symptoms, and smoking cessation. We also conducted a routine wellness check and planned for necessary blood work.  YOUR PLAN: ADULT WELLNESS VISIT: Routine wellness visit with slightly elevated blood pressure and no recent gynecological visit. -Order blood work. -Encourage follow-up with gynecologist. -Recheck blood pressure during visit.  OBESITY: You are interested in weight loss options, including medications like Zepbound  and Wegovy. -Prescribe Zepbound  if covered by insurance. -Provide voucher for Zepbound . -Discuss potential for oral option in the future. -Encourage physical activity and mindful eating. -Schedule follow-up in 3 months.  ANXIETY DISORDER: You are experiencing anxiety due to situational stressors and prefer as-needed medication. -Prescribe Buspar  for as-needed use. -Continue therapy sessions.  MENOPAUSAL STATE: You are experiencing menopausal symptoms like night sweats and have not had a period for five years. -Monitor symptoms and discuss any changes during follow-up visits.  TOBACCO USE DISORDER: You are currently smoking a quarter pack per day and are trying to quit. -Encourage smoking cessation efforts. -Discuss potential benefits of GLP-1 receptor agonists in reducing cravings.                      Contains text generated by [employer].                                 Contains text generated by [employer].

## 2024-06-30 ENCOUNTER — Other Ambulatory Visit (HOSPITAL_COMMUNITY): Payer: Self-pay

## 2024-06-30 ENCOUNTER — Encounter: Payer: Self-pay | Admitting: Physician Assistant

## 2024-06-30 ENCOUNTER — Ambulatory Visit: Payer: Self-pay | Admitting: Physician Assistant

## 2024-06-30 ENCOUNTER — Other Ambulatory Visit: Payer: Self-pay

## 2024-06-30 ENCOUNTER — Telehealth: Payer: Self-pay

## 2024-06-30 LAB — HEPATITIS C ANTIBODY: Hepatitis C Ab: NONREACTIVE

## 2024-06-30 LAB — HIV ANTIBODY (ROUTINE TESTING W REFLEX)
HIV 1&2 Ab, 4th Generation: NONREACTIVE
HIV FINAL INTERPRETATION: NEGATIVE

## 2024-06-30 MED ORDER — ZEPBOUND 2.5 MG/0.5ML ~~LOC~~ SOLN
2.5000 mg | SUBCUTANEOUS | 0 refills | Status: DC
Start: 2024-06-30 — End: 2024-08-02

## 2024-06-30 MED ORDER — VITAMIN D (ERGOCALCIFEROL) 1.25 MG (50000 UNIT) PO CAPS: 50000.0000 [IU] | ORAL_CAPSULE | ORAL | 0 refills | Status: AC

## 2024-06-30 NOTE — Telephone Encounter (Signed)
 Spoke to pt told her PA for Zepbound  was denied by insurance, does not cover weight loss medications. Pt verbalized understanding.

## 2024-06-30 NOTE — Telephone Encounter (Signed)
 Pharmacy Patient Advocate Encounter   Received notification from Onbase that prior authorization for Zepbound  2.5MG /0.5ML pen-injectors is required/requested.   Insurance verification completed.   The patient is insured through [facility] .   Per test claim:   -Medication not covered for weight loss. Coverage might be available for OSA.

## 2024-06-30 NOTE — Telephone Encounter (Signed)
 Spoke to pt told her we had to send Rx to [facility] and the voucher is for vials. They will contact you and then tell them you have a voucher and they will ask for coder or something on voucher. Pt verbalized understanding.

## 2024-07-12 ENCOUNTER — Encounter: Payer: Self-pay | Admitting: Physician Assistant

## 2024-07-12 MED ORDER — ONDANSETRON HCL 4 MG PO TABS: 4.0000 mg | ORAL_TABLET | Freq: Three times a day (TID) | ORAL | 0 refills | Status: AC | PRN

## 2024-07-12 NOTE — Telephone Encounter (Signed)
 Please see message and advise

## 2024-07-13 ENCOUNTER — Encounter: Payer: Self-pay | Admitting: *Deleted

## 2024-07-13 ENCOUNTER — Ambulatory Visit (INDEPENDENT_AMBULATORY_CARE_PROVIDER_SITE_OTHER): Admitting: *Deleted

## 2024-07-13 DIAGNOSIS — E538 Deficiency of other specified B group vitamins: Secondary | ICD-10-CM | POA: Diagnosis not present

## 2024-07-13 MED ORDER — CYANOCOBALAMIN 1000 MCG/ML IJ SOLN
1000.0000 ug | Freq: Once | INTRAMUSCULAR | Status: AC
  Administered 2024-07-13: 1000 ug via INTRAMUSCULAR

## 2024-07-13 NOTE — Progress Notes (Signed)
 Per orders of [name], Sarah Cardenas, injection of Cyanocobalamin 1000 mcg given IM by Corky Mull, LPN in left deltoid. Patient tolerated injection well. Patient will make appointment for 1 week.

## 2024-07-20 ENCOUNTER — Ambulatory Visit (INDEPENDENT_AMBULATORY_CARE_PROVIDER_SITE_OTHER)

## 2024-07-20 DIAGNOSIS — E538 Deficiency of other specified B group vitamins: Secondary | ICD-10-CM | POA: Diagnosis not present

## 2024-07-20 MED ORDER — CYANOCOBALAMIN 1000 MCG/ML IJ SOLN
1000.0000 ug | Freq: Once | INTRAMUSCULAR | Status: AC
  Administered 2024-07-20: 1000 ug via INTRAMUSCULAR

## 2024-07-20 NOTE — Progress Notes (Signed)
 Patient is in office today for a nurse visit for B12 Injection. Patient Injection was given in the  Right deltoid. Patient tolerated injection well.

## 2024-07-27 ENCOUNTER — Ambulatory Visit

## 2024-07-27 DIAGNOSIS — E538 Deficiency of other specified B group vitamins: Secondary | ICD-10-CM | POA: Diagnosis not present

## 2024-07-27 MED ORDER — CYANOCOBALAMIN 1000 MCG/ML IJ SOLN
1000.0000 ug | Freq: Once | INTRAMUSCULAR | Status: AC
  Administered 2024-07-27: 1000 ug via INTRAMUSCULAR

## 2024-07-27 NOTE — Progress Notes (Signed)
.  Patient is in office today for a nurse visit for B12 Injection. Patient Injection was given in the  Left deltoid. Patient tolerated injection well. Per Job. Pt was advised to schedule next B12 injection

## 2024-08-02 ENCOUNTER — Other Ambulatory Visit: Payer: Self-pay

## 2024-08-02 ENCOUNTER — Encounter: Payer: Self-pay | Admitting: Physician Assistant

## 2024-08-02 MED ORDER — ZEPBOUND 2.5 MG/0.5ML ~~LOC~~ SOLN: 2.5000 mg | SUBCUTANEOUS | 0 refills | Status: DC

## 2024-08-03 ENCOUNTER — Ambulatory Visit

## 2024-08-03 DIAGNOSIS — E538 Deficiency of other specified B group vitamins: Secondary | ICD-10-CM

## 2024-08-03 MED ORDER — CYANOCOBALAMIN 1000 MCG/ML IJ SOLN
1000.0000 ug | Freq: Once | INTRAMUSCULAR | Status: AC
  Administered 2024-08-03: 1000 ug via INTRAMUSCULAR

## 2024-08-03 NOTE — Progress Notes (Signed)
.  Patient is in office today for a nurse visit for B12 Injection. Patient Injection was given in the  Right deltoid. Patient tolerated injection well. Per Job. Pt was advised to schedule next injection.

## 2024-08-10 ENCOUNTER — Telehealth: Admitting: Physician Assistant

## 2024-08-10 DIAGNOSIS — J069 Acute upper respiratory infection, unspecified: Secondary | ICD-10-CM | POA: Diagnosis not present

## 2024-08-10 MED ORDER — BENZONATATE 100 MG PO CAPS: 100.0000 mg | ORAL_CAPSULE | Freq: Three times a day (TID) | ORAL | 0 refills | Status: DC | PRN

## 2024-08-10 NOTE — Progress Notes (Signed)

## 2024-08-16 ENCOUNTER — Encounter: Payer: Self-pay | Admitting: Physician Assistant

## 2024-08-16 ENCOUNTER — Telehealth: Admitting: Physician Assistant

## 2024-08-16 VITALS — Ht 66.0 in | Wt 270.0 lb

## 2024-08-16 DIAGNOSIS — R051 Acute cough: Secondary | ICD-10-CM

## 2024-08-16 MED ORDER — PREDNISONE 20 MG PO TABS: 20.0000 mg | ORAL_TABLET | Freq: Two times a day (BID) | ORAL | 0 refills | Status: DC

## 2024-08-16 MED ORDER — AMOXICILLIN-POT CLAVULANATE 875-125 MG PO TABS: 1.0000 | ORAL_TABLET | Freq: Two times a day (BID) | ORAL | 0 refills | Status: DC

## 2024-08-16 NOTE — Progress Notes (Signed)
 Virtual Visit via Video Note   I, Lucie Buttner, connected with  Sarah Cardenas  ([id number], April 22, 1986) on 08/16/24 at  3:00 PM EDT by a video-enabled telemedicine application and verified that I am speaking with the correct person using two identifiers.  Location: Patient: Home Provider: [facility] office   I discussed the limitations of evaluation and management by telemedicine and the availability of in person appointments. The patient expressed understanding and agreed to proceed.    Discussed the use of AI scribe software for clinical note transcription with the patient, who gave verbal consent to proceed.  History of Present Illness   Sarah Cardenas is a 38 year old female who presents with worsening cough and chest pain.  She experiences a worsening cough that has deepened and causes chest pain, especially during coughing. The cough disrupts her sleep. She uses an albuterol  inhaler daily, particularly at night when lying down, due to shortness of breath, with slight relief. [place]  No severe shortness of breath or chest pain that feels like an emergency.       Problems:  Patient Active Problem List   Diagnosis Date Noted   Chronic mastoiditis of right side 08/09/2023   Conductive hearing loss in right ear 08/09/2023   Obsessive-compulsive disorder 08/06/2021   PTSD (post-traumatic stress disorder) 08/06/2021   Anxiety 08/06/2021   Other hyperlipidemia 08/02/2020   Iron deficiency anemia 08/01/2020   Insulin  resistance 05/01/2020   Tobacco use disorder 05/01/2020   Vitamin D  deficiency 04/17/2020   Bipolar 1 disorder (HCC) 04/17/2020   Class 3 severe obesity with serious comorbidity and body mass index (BMI) of 40.0 to 44.9 in adult (HCC) 12/30/2019   Bipolar 1 disorder, depressed, severe (HCC) 05/26/2019   Cervical intraepithelial neoplasia grade 1 12/07/2018    Allergies:  Allergies  Allergen Reactions    Ciprofloxacin Other (See Comments)    numbness   Medications:  Current Outpatient Medications:    albuterol  (VENTOLIN  HFA) 108 (90 Base) MCG/ACT inhaler, Inhale 2 puffs into the lungs every 6 (six) hours as needed for wheezing or shortness of breath., Disp: 8 g, Rfl: 0   benzonatate  (TESSALON ) 100 MG capsule, Take 1 capsule (100 mg total) by mouth 3 (three) times daily as needed for cough., Disp: 30 capsule, Rfl: 0   busPIRone  (BUSPAR ) 10 MG tablet, Take 1 tablet (10 mg total) by mouth 3 (three) times daily as needed., Disp: 30 tablet, Rfl: 1   ondansetron  (ZOFRAN ) 4 MG tablet, Take 1 tablet (4 mg total) by mouth every 8 (eight) hours as needed for nausea or vomiting., Disp: 20 tablet, Rfl: 0   tirzepatide  (ZEPBOUND ) 2.5 MG/0.5ML injection vial, Inject 2.5 mg into the skin once a week., Disp: 2 mL, Rfl: 0   Vitamin D , Ergocalciferol , (DRISDOL ) 1.25 MG (50000 UNIT) CAPS capsule, Take 1 capsule (50,000 Units total) by mouth every 7 (seven) days., Disp: 12 capsule, Rfl: 0  Observations/Objective: Patient is well-developed, well-nourished in no acute distress.  Resting comfortably  at home.  Head is normocephalic, atraumatic.  No labored breathing.  Speech is clear and coherent with logical content.  Patient is alert and oriented at baseline.   Assessment and Plan    Acute cough Acute bronchitis with worsening symptoms despite previous treatment. No severe emergency symptoms. Daily albuterol  use for breathing difficulties. - Prescribed Augmentin  - Prescribed oral steroid with instructions to start if no improvement after a few days of antibiotics. -  Discussed steroid side effects: increased hunger, anxiety.   Follow Up Instructions: I discussed the assessment and treatment plan with the patient. The patient was provided an opportunity to ask questions and all were answered. The patient agreed with the plan and demonstrated an understanding of the instructions.  A copy of instructions were  sent to the patient via MyChart unless otherwise noted below.   The patient was advised to call back or seek an in-person evaluation if the symptoms worsen or if the condition fails to improve as anticipated.  Lucie Buttner, [place]

## 2024-09-06 ENCOUNTER — Encounter: Payer: Self-pay | Admitting: Physician Assistant

## 2024-09-06 MED ORDER — ZEPBOUND 5 MG/0.5ML ~~LOC~~ SOLN: 5.0000 mg | SUBCUTANEOUS | 0 refills | Status: DC

## 2024-09-07 ENCOUNTER — Ambulatory Visit

## 2024-09-07 DIAGNOSIS — E538 Deficiency of other specified B group vitamins: Secondary | ICD-10-CM | POA: Diagnosis not present

## 2024-09-07 MED ORDER — CYANOCOBALAMIN 1000 MCG/ML IJ SOLN
1000.0000 ug | Freq: Once | INTRAMUSCULAR | Status: AC
  Administered 2024-09-07: 1000 ug via INTRAMUSCULAR

## 2024-09-07 NOTE — Progress Notes (Signed)
 Patient is in office today for a nurse visit for B12 Injection. Patient Injection was given in the  Left deltoid. Patient tolerated injection well. No other question or concerns were addressed during the visit.

## 2024-09-30 ENCOUNTER — Ambulatory Visit: Admitting: Physician Assistant

## 2024-10-05 ENCOUNTER — Ambulatory Visit: Admitting: Physician Assistant

## 2024-10-05 ENCOUNTER — Encounter: Payer: Self-pay | Admitting: Physician Assistant

## 2024-10-05 VITALS — BP 130/88 | HR 74 | Temp 97.4°F | Ht 66.0 in | Wt 257.2 lb

## 2024-10-05 DIAGNOSIS — Z720 Tobacco use: Secondary | ICD-10-CM

## 2024-10-05 DIAGNOSIS — F419 Anxiety disorder, unspecified: Secondary | ICD-10-CM

## 2024-10-05 DIAGNOSIS — E669 Obesity, unspecified: Secondary | ICD-10-CM

## 2024-10-05 MED ORDER — ZEPBOUND 5 MG/0.5ML ~~LOC~~ SOLN: 5.0000 mg | SUBCUTANEOUS | 0 refills | Status: DC

## 2024-10-05 NOTE — Progress Notes (Signed)
 History of Present Illness:   Chief Complaint  Patient presents with   Weight Management Screening    Pt here for 3 month f/u currently taking Zepbound  5 mg weekly injection.    Discussed the use of AI scribe software for clinical note transcription with the patient, who gave verbal consent to proceed.  History of Present Illness   Sarah Cardenas is a 38 year old female who presents for follow-up on weight management and medication review.  She has lost 13 pounds from an initial weight of about 270 pounds on her current weight management regimen. She avoids weighing at home but notes clothing fits looser.  She had nausea for the first two days after dose increases, which resolved within the first week. She is on Zepbound  5 mg weekly, self-paying $349, and prefers to continue. She previously used 2.5 mg for two months and plans to stay on 5 mg for another month.  She takes Buspar  about once weekly at night for racing thoughts and finds it effective. She recently filled her second prescription over three months.  She is nearing completion of her B12 injections and is on a monthly schedule. She has been inconsistent with vitamin D .  She has reduced smoking from 0.5-1 pack per day to about 1 pack per week and feels Zepbound  helps curb nicotine cravings. She did not smoke for about a week when she first started Zepbound  due to nausea.        Past Medical History:  Diagnosis Date   Alcohol abuse    Anemia    Anxiety    Asthma    B12 deficiency    Back pain    Bipolar affect, depressed (HCC)    Constipation    Depression    Drug use      Social History[1]  Past Surgical History:  Procedure Laterality Date   BREAST SURGERY  2011   lump removed   ear drum replacement     right   FRACTURE SURGERY  2004    Family History  Problem Relation Age of Onset   Hyperlipidemia Mother    Thyroid disease Mother    Obesity Mother    Bipolar disorder Father    Drug abuse  Father    Depression Father    Anxiety disorder Father    Alcoholism Father    Thyroid disease Sister    Cancer Maternal Grandmother    Lung cancer Maternal Grandfather    Neuropathy Paternal Grandmother    Breast cancer Neg Hx    Colon cancer Neg Hx     Allergies[2]  Current Medications:  Current Medications[3]   Review of Systems:   Negative unless otherwise specified per HPI.  Vitals:   Vitals:   10/05/24 0824  BP: 130/88  Pulse: 74  Temp: (!) 97.4 F (36.3 C)  TempSrc: Temporal  SpO2: 98%  Weight: 257 lb 4 oz (116.7 kg)  Height: 5' 6 (1.676 m)     Body mass index is 41.52 kg/m.  Physical Exam:   Physical Exam Vitals and nursing note reviewed.  Constitutional:      General: She is not in acute distress.    Appearance: She is well-developed. She is not ill-appearing or toxic-appearing.  Cardiovascular:     Rate and Rhythm: Normal rate and regular rhythm.     Pulses: Normal pulses.     Heart sounds: Normal heart sounds, S1 normal and S2 normal.  Pulmonary:     Effort: Pulmonary  effort is normal.     Breath sounds: Normal breath sounds.  Skin:    General: Skin is warm and dry.  Neurological:     Mental Status: She is alert.     GCS: GCS eye subscore is 4. GCS verbal subscore is 5. GCS motor subscore is 6.  Psychiatric:        Speech: Speech normal.        Behavior: Behavior normal. Behavior is cooperative.     Assessment and Plan:   Assessment and Plan    Obesity Management with Zepbound  resulted in 13-pound weight loss. She tolerates medication well despite initial nausea. Prefers Zepbound  over [facility] due to personal choice, considering future oral options for cost-effectiveness. Reduced smoking attributed to Zepbound 's effect on cravings. - Continue Zepbound  with a one-month supply at 5 mg. - Discussed potential future switch to oral versions of Wegovy and Zepbound  when available.  Anxiety disorder Managed with Buspar , used primarily at night  once a week. Reports significant symptom improvement. - Continue Buspar  as currently prescribed.     Tobacco abuse Making good progress! Continue efforts at reduction  Follow up in 3 month(s), sooner if concerns   Lucie Buttner, PA-C    [1]  Social History Tobacco Use   Smoking status: Every Day    Average packs/day: 0.3 packs/day for 10.0 years (2.5 ttl pk-yrs)    Types: Cigarettes    Start date: 2002   Smokeless tobacco: Never  Vaping Use   Vaping status: Never Used  Substance Use Topics   Alcohol use: Yes    Comment: occasional; social   Drug use: No  [2]  Allergies Allergen Reactions   Ciprofloxacin Other (See Comments)    numbness  [3]  Current Outpatient Medications:    albuterol  (VENTOLIN  HFA) 108 (90 Base) MCG/ACT inhaler, Inhale 2 puffs into the lungs every 6 (six) hours as needed for wheezing or shortness of breath., Disp: 8 g, Rfl: 0   busPIRone  (BUSPAR ) 10 MG tablet, Take 1 tablet (10 mg total) by mouth 3 (three) times daily as needed., Disp: 30 tablet, Rfl: 1   ondansetron  (ZOFRAN ) 4 MG tablet, Take 1 tablet (4 mg total) by mouth every 8 (eight) hours as needed for nausea or vomiting., Disp: 20 tablet, Rfl: 0   tirzepatide  (ZEPBOUND ) 5 MG/0.5ML injection vial, Inject 5 mg into the skin once a week., Disp: 2 mL, Rfl: 0   Vitamin D , Ergocalciferol , (DRISDOL ) 1.25 MG (50000 UNIT) CAPS capsule, Take 1 capsule (50,000 Units total) by mouth every 7 (seven) days., Disp: 12 capsule, Rfl: 0

## 2024-10-07 ENCOUNTER — Ambulatory Visit

## 2024-10-07 DIAGNOSIS — E538 Deficiency of other specified B group vitamins: Secondary | ICD-10-CM | POA: Diagnosis not present

## 2024-10-07 MED ORDER — CYANOCOBALAMIN 1000 MCG/ML IJ SOLN
1000.0000 ug | Freq: Once | INTRAMUSCULAR | Status: AC
  Administered 2024-10-07: 16:00:00 1000 ug via INTRAMUSCULAR

## 2024-10-07 NOTE — Progress Notes (Signed)
 Patient is in office today for a nurse visit for B12 Injection. Patient Injection was given in the  Right deltoid. Patient tolerated injection well. No other questions or concerns were addressed during this visit.

## 2024-10-27 ENCOUNTER — Encounter: Payer: Self-pay | Admitting: Physician Assistant

## 2024-10-27 MED ORDER — TIRZEPATIDE-WEIGHT MANAGEMENT 7.5 MG/0.5ML ~~LOC~~ SOLN: 7.5000 mg | SUBCUTANEOUS | 0 refills | Status: DC

## 2024-11-09 ENCOUNTER — Ambulatory Visit

## 2024-11-09 DIAGNOSIS — E538 Deficiency of other specified B group vitamins: Secondary | ICD-10-CM

## 2024-11-09 MED ORDER — CYANOCOBALAMIN 1000 MCG/ML IJ SOLN
1000.0000 ug | Freq: Once | INTRAMUSCULAR | Status: AC
  Administered 2024-11-09: 1000 ug via INTRAMUSCULAR

## 2024-11-09 NOTE — Progress Notes (Signed)
 Patient is in office today for a nurse visit for B12 Injection. Patient Injection was given in the  Left deltoid. Patient tolerated injection well. No other questions or concerns were addressed during this visit.

## 2024-11-23 ENCOUNTER — Other Ambulatory Visit: Payer: Self-pay | Admitting: Physician Assistant

## 2025-01-04 ENCOUNTER — Ambulatory Visit: Admitting: Physician Assistant

## 2025-01-12 ENCOUNTER — Ambulatory Visit: Admitting: Physician Assistant
